# Patient Record
Sex: Female | Born: 1950 | Race: Black or African American | Hispanic: No | Marital: Married | Smoking: Never smoker
Health system: Southern US, Community
[De-identification: ages and names within clinical notes are randomized; demographics above are authoritative.]

## PROBLEM LIST (undated history)

## (undated) DIAGNOSIS — Z860100 Personal history of colon polyps, unspecified: Secondary | ICD-10-CM

## (undated) DIAGNOSIS — G4700 Insomnia, unspecified: Secondary | ICD-10-CM

## (undated) DIAGNOSIS — I10 Essential (primary) hypertension: Secondary | ICD-10-CM

## (undated) DIAGNOSIS — M549 Dorsalgia, unspecified: Secondary | ICD-10-CM

## (undated) DIAGNOSIS — Z87440 Personal history of urinary (tract) infections: Secondary | ICD-10-CM

## (undated) DIAGNOSIS — Z923 Personal history of irradiation: Secondary | ICD-10-CM

## (undated) DIAGNOSIS — C50919 Malignant neoplasm of unspecified site of unspecified female breast: Secondary | ICD-10-CM

## (undated) DIAGNOSIS — M1990 Unspecified osteoarthritis, unspecified site: Secondary | ICD-10-CM

## (undated) DIAGNOSIS — G629 Polyneuropathy, unspecified: Secondary | ICD-10-CM

## (undated) DIAGNOSIS — Z9289 Personal history of other medical treatment: Secondary | ICD-10-CM

## (undated) DIAGNOSIS — R42 Dizziness and giddiness: Secondary | ICD-10-CM

## (undated) DIAGNOSIS — K5790 Diverticulosis of intestine, part unspecified, without perforation or abscess without bleeding: Secondary | ICD-10-CM

## (undated) DIAGNOSIS — G8929 Other chronic pain: Secondary | ICD-10-CM

## (undated) DIAGNOSIS — E7800 Pure hypercholesterolemia, unspecified: Secondary | ICD-10-CM

## (undated) DIAGNOSIS — R51 Headache: Secondary | ICD-10-CM

## (undated) DIAGNOSIS — K5900 Constipation, unspecified: Secondary | ICD-10-CM

## (undated) DIAGNOSIS — M542 Cervicalgia: Secondary | ICD-10-CM

## (undated) DIAGNOSIS — E039 Hypothyroidism, unspecified: Secondary | ICD-10-CM

## (undated) HISTORY — DX: Headache: R51

## (undated) HISTORY — DX: Cervicalgia: M54.2

## (undated) HISTORY — DX: Dizziness and giddiness: R42

## (undated) HISTORY — DX: Personal history of irradiation: Z92.3

## (undated) HISTORY — DX: Malignant neoplasm of unspecified site of unspecified female breast: C50.919

---

## 1981-01-30 HISTORY — PX: ABDOMINAL HYSTERECTOMY: SHX81

## 1992-01-31 HISTORY — PX: CHOLECYSTECTOMY: SHX55

## 2000-11-06 ENCOUNTER — Encounter: Payer: Self-pay | Admitting: Internal Medicine

## 2000-11-06 ENCOUNTER — Ambulatory Visit (HOSPITAL_COMMUNITY): Admission: RE | Admit: 2000-11-06 | Discharge: 2000-11-06 | Payer: Self-pay | Admitting: Internal Medicine

## 2000-11-26 ENCOUNTER — Encounter: Payer: Self-pay | Admitting: Internal Medicine

## 2000-11-26 ENCOUNTER — Ambulatory Visit (HOSPITAL_COMMUNITY): Admission: RE | Admit: 2000-11-26 | Discharge: 2000-11-26 | Payer: Self-pay | Admitting: Internal Medicine

## 2000-12-12 ENCOUNTER — Ambulatory Visit (HOSPITAL_COMMUNITY): Admission: RE | Admit: 2000-12-12 | Discharge: 2000-12-12 | Payer: Self-pay | Admitting: General Surgery

## 2001-02-04 ENCOUNTER — Other Ambulatory Visit: Admission: RE | Admit: 2001-02-04 | Discharge: 2001-02-04 | Payer: Self-pay | Admitting: Internal Medicine

## 2001-06-19 ENCOUNTER — Ambulatory Visit (HOSPITAL_COMMUNITY): Admission: RE | Admit: 2001-06-19 | Discharge: 2001-06-19 | Payer: Self-pay | Admitting: Internal Medicine

## 2001-06-19 ENCOUNTER — Encounter: Payer: Self-pay | Admitting: Internal Medicine

## 2001-11-27 ENCOUNTER — Encounter: Payer: Self-pay | Admitting: Internal Medicine

## 2001-11-27 ENCOUNTER — Ambulatory Visit (HOSPITAL_COMMUNITY): Admission: RE | Admit: 2001-11-27 | Discharge: 2001-11-27 | Payer: Self-pay | Admitting: Internal Medicine

## 2002-06-10 ENCOUNTER — Encounter (INDEPENDENT_AMBULATORY_CARE_PROVIDER_SITE_OTHER): Payer: Self-pay | Admitting: Internal Medicine

## 2002-12-09 ENCOUNTER — Ambulatory Visit (HOSPITAL_COMMUNITY): Admission: RE | Admit: 2002-12-09 | Discharge: 2002-12-09 | Payer: Self-pay | Admitting: Internal Medicine

## 2003-02-10 ENCOUNTER — Ambulatory Visit (HOSPITAL_COMMUNITY): Admission: RE | Admit: 2003-02-10 | Discharge: 2003-02-10 | Payer: Self-pay | Admitting: Internal Medicine

## 2003-02-10 HISTORY — PX: ESOPHAGOGASTRODUODENOSCOPY: SHX1529

## 2003-04-20 ENCOUNTER — Encounter (INDEPENDENT_AMBULATORY_CARE_PROVIDER_SITE_OTHER): Payer: Self-pay | Admitting: Internal Medicine

## 2003-10-22 ENCOUNTER — Encounter: Admission: RE | Admit: 2003-10-22 | Discharge: 2003-10-22 | Payer: Self-pay | Admitting: Gastroenterology

## 2003-12-21 ENCOUNTER — Ambulatory Visit (HOSPITAL_COMMUNITY): Admission: RE | Admit: 2003-12-21 | Discharge: 2003-12-21 | Payer: Self-pay | Admitting: Internal Medicine

## 2004-03-15 ENCOUNTER — Encounter: Admission: RE | Admit: 2004-03-15 | Discharge: 2004-03-15 | Payer: Self-pay | Admitting: Neurology

## 2004-03-29 ENCOUNTER — Encounter (HOSPITAL_COMMUNITY): Admission: RE | Admit: 2004-03-29 | Discharge: 2004-04-28 | Payer: Self-pay | Admitting: Neurology

## 2004-12-17 ENCOUNTER — Encounter: Admission: RE | Admit: 2004-12-17 | Discharge: 2004-12-17 | Payer: Self-pay | Admitting: Internal Medicine

## 2004-12-21 ENCOUNTER — Ambulatory Visit (HOSPITAL_COMMUNITY): Admission: RE | Admit: 2004-12-21 | Discharge: 2004-12-21 | Payer: Self-pay | Admitting: Internal Medicine

## 2005-04-25 ENCOUNTER — Encounter (HOSPITAL_COMMUNITY): Admission: RE | Admit: 2005-04-25 | Discharge: 2005-05-25 | Payer: Self-pay | Admitting: Endocrinology

## 2005-05-15 ENCOUNTER — Encounter (INDEPENDENT_AMBULATORY_CARE_PROVIDER_SITE_OTHER): Payer: Self-pay | Admitting: Internal Medicine

## 2005-05-16 ENCOUNTER — Encounter (INDEPENDENT_AMBULATORY_CARE_PROVIDER_SITE_OTHER): Payer: Self-pay | Admitting: Internal Medicine

## 2005-07-14 ENCOUNTER — Encounter (INDEPENDENT_AMBULATORY_CARE_PROVIDER_SITE_OTHER): Payer: Self-pay | Admitting: Internal Medicine

## 2005-07-16 ENCOUNTER — Encounter (INDEPENDENT_AMBULATORY_CARE_PROVIDER_SITE_OTHER): Payer: Self-pay | Admitting: Internal Medicine

## 2005-07-17 ENCOUNTER — Ambulatory Visit: Payer: Self-pay | Admitting: Internal Medicine

## 2005-09-25 ENCOUNTER — Ambulatory Visit: Payer: Self-pay | Admitting: Internal Medicine

## 2005-11-21 ENCOUNTER — Encounter (INDEPENDENT_AMBULATORY_CARE_PROVIDER_SITE_OTHER): Payer: Self-pay | Admitting: Internal Medicine

## 2006-01-03 ENCOUNTER — Ambulatory Visit: Payer: Self-pay | Admitting: Internal Medicine

## 2006-01-08 ENCOUNTER — Ambulatory Visit (HOSPITAL_COMMUNITY): Admission: RE | Admit: 2006-01-08 | Discharge: 2006-01-08 | Payer: Self-pay | Admitting: Internal Medicine

## 2006-01-08 ENCOUNTER — Encounter (INDEPENDENT_AMBULATORY_CARE_PROVIDER_SITE_OTHER): Payer: Self-pay | Admitting: Internal Medicine

## 2006-04-16 ENCOUNTER — Ambulatory Visit (HOSPITAL_COMMUNITY): Admission: RE | Admit: 2006-04-16 | Discharge: 2006-04-16 | Payer: Self-pay | Admitting: Internal Medicine

## 2006-10-22 ENCOUNTER — Ambulatory Visit: Payer: Self-pay | Admitting: Internal Medicine

## 2006-10-22 DIAGNOSIS — E0590 Thyrotoxicosis, unspecified without thyrotoxic crisis or storm: Secondary | ICD-10-CM | POA: Insufficient documentation

## 2006-10-22 DIAGNOSIS — R61 Generalized hyperhidrosis: Secondary | ICD-10-CM | POA: Insufficient documentation

## 2006-10-22 DIAGNOSIS — N6019 Diffuse cystic mastopathy of unspecified breast: Secondary | ICD-10-CM | POA: Insufficient documentation

## 2006-10-22 DIAGNOSIS — I10 Essential (primary) hypertension: Secondary | ICD-10-CM | POA: Insufficient documentation

## 2006-10-22 DIAGNOSIS — N318 Other neuromuscular dysfunction of bladder: Secondary | ICD-10-CM | POA: Insufficient documentation

## 2006-10-22 DIAGNOSIS — Z9189 Other specified personal risk factors, not elsewhere classified: Secondary | ICD-10-CM | POA: Insufficient documentation

## 2006-10-22 DIAGNOSIS — E785 Hyperlipidemia, unspecified: Secondary | ICD-10-CM | POA: Insufficient documentation

## 2006-10-23 ENCOUNTER — Telehealth (INDEPENDENT_AMBULATORY_CARE_PROVIDER_SITE_OTHER): Payer: Self-pay | Admitting: *Deleted

## 2006-10-23 LAB — CONVERTED CEMR LAB
Basophils Absolute: 0 10*3/uL (ref 0.0–0.1)
Basophils Relative: 0 % (ref 0–1)
Eosinophils Absolute: 0.2 10*3/uL (ref 0.0–0.7)
Eosinophils Relative: 3 % (ref 0–5)
Free T4: 0.95 ng/dL (ref 0.89–1.80)
HCT: 38.3 % (ref 36.0–46.0)
Hemoglobin: 12.2 g/dL (ref 12.0–15.0)
Lymphocytes Relative: 44 % (ref 12–46)
Lymphs Abs: 2.9 10*3/uL (ref 0.7–3.3)
MCHC: 31.9 g/dL (ref 30.0–36.0)
MCV: 88.9 fL (ref 78.0–100.0)
Monocytes Absolute: 0.3 10*3/uL (ref 0.2–0.7)
Monocytes Relative: 4 % (ref 3–11)
Neutro Abs: 3.2 10*3/uL (ref 1.7–7.7)
Neutrophils Relative %: 49 % (ref 43–77)
Platelets: 276 10*3/uL (ref 150–400)
RBC: 4.31 M/uL (ref 3.87–5.11)
RDW: 13 % (ref 11.5–14.0)
TSH: 1.43 microintl units/mL (ref 0.350–5.50)
WBC: 6.6 10*3/uL (ref 4.0–10.5)

## 2006-12-29 ENCOUNTER — Emergency Department (HOSPITAL_COMMUNITY): Admission: EM | Admit: 2006-12-29 | Discharge: 2006-12-29 | Payer: Self-pay | Admitting: Emergency Medicine

## 2007-01-08 ENCOUNTER — Encounter (HOSPITAL_COMMUNITY): Admission: RE | Admit: 2007-01-08 | Discharge: 2007-01-30 | Payer: Self-pay | Admitting: Internal Medicine

## 2007-01-10 ENCOUNTER — Ambulatory Visit (HOSPITAL_COMMUNITY): Admission: RE | Admit: 2007-01-10 | Discharge: 2007-01-10 | Payer: Self-pay | Admitting: Internal Medicine

## 2007-01-15 ENCOUNTER — Telehealth (INDEPENDENT_AMBULATORY_CARE_PROVIDER_SITE_OTHER): Payer: Self-pay | Admitting: *Deleted

## 2007-02-11 ENCOUNTER — Ambulatory Visit (HOSPITAL_COMMUNITY): Admission: RE | Admit: 2007-02-11 | Discharge: 2007-02-11 | Payer: Self-pay | Admitting: Internal Medicine

## 2007-03-25 ENCOUNTER — Ambulatory Visit: Payer: Self-pay | Admitting: Internal Medicine

## 2007-03-25 DIAGNOSIS — N951 Menopausal and female climacteric states: Secondary | ICD-10-CM

## 2007-03-27 ENCOUNTER — Telehealth (INDEPENDENT_AMBULATORY_CARE_PROVIDER_SITE_OTHER): Payer: Self-pay | Admitting: Internal Medicine

## 2007-04-02 ENCOUNTER — Telehealth (INDEPENDENT_AMBULATORY_CARE_PROVIDER_SITE_OTHER): Payer: Self-pay | Admitting: *Deleted

## 2007-06-28 ENCOUNTER — Ambulatory Visit (HOSPITAL_COMMUNITY): Admission: RE | Admit: 2007-06-28 | Discharge: 2007-06-28 | Payer: Self-pay | Admitting: Internal Medicine

## 2007-12-13 ENCOUNTER — Ambulatory Visit (HOSPITAL_COMMUNITY): Admission: RE | Admit: 2007-12-13 | Discharge: 2007-12-13 | Payer: Self-pay | Admitting: Internal Medicine

## 2007-12-30 ENCOUNTER — Ambulatory Visit (HOSPITAL_COMMUNITY): Admission: RE | Admit: 2007-12-30 | Discharge: 2007-12-30 | Payer: Self-pay | Admitting: Internal Medicine

## 2008-01-15 ENCOUNTER — Ambulatory Visit (HOSPITAL_COMMUNITY): Admission: RE | Admit: 2008-01-15 | Discharge: 2008-01-15 | Payer: Self-pay | Admitting: Obstetrics and Gynecology

## 2008-01-24 ENCOUNTER — Emergency Department (HOSPITAL_COMMUNITY): Admission: EM | Admit: 2008-01-24 | Discharge: 2008-01-24 | Payer: Self-pay | Admitting: Emergency Medicine

## 2008-04-17 ENCOUNTER — Ambulatory Visit (HOSPITAL_COMMUNITY)
Admission: RE | Admit: 2008-04-17 | Discharge: 2008-04-17 | Payer: Self-pay | Admitting: Physical Medicine and Rehabilitation

## 2008-07-22 ENCOUNTER — Ambulatory Visit (HOSPITAL_COMMUNITY): Admission: RE | Admit: 2008-07-22 | Discharge: 2008-07-22 | Payer: Self-pay | Admitting: Endocrinology

## 2008-07-23 ENCOUNTER — Emergency Department (HOSPITAL_COMMUNITY): Admission: EM | Admit: 2008-07-23 | Discharge: 2008-07-23 | Payer: Self-pay | Admitting: Emergency Medicine

## 2008-08-04 ENCOUNTER — Encounter (INDEPENDENT_AMBULATORY_CARE_PROVIDER_SITE_OTHER): Payer: Self-pay | Admitting: *Deleted

## 2008-09-08 ENCOUNTER — Ambulatory Visit: Payer: Self-pay | Admitting: Internal Medicine

## 2008-09-09 ENCOUNTER — Encounter: Payer: Self-pay | Admitting: Internal Medicine

## 2008-09-14 DIAGNOSIS — R1310 Dysphagia, unspecified: Secondary | ICD-10-CM | POA: Insufficient documentation

## 2008-09-17 ENCOUNTER — Ambulatory Visit (HOSPITAL_COMMUNITY): Admission: RE | Admit: 2008-09-17 | Discharge: 2008-09-17 | Payer: Self-pay | Admitting: Internal Medicine

## 2008-09-17 ENCOUNTER — Ambulatory Visit: Payer: Self-pay | Admitting: Internal Medicine

## 2008-09-17 HISTORY — PX: COLONOSCOPY: SHX174

## 2008-09-17 HISTORY — PX: ESOPHAGOGASTRODUODENOSCOPY: SHX1529

## 2008-10-02 ENCOUNTER — Ambulatory Visit (HOSPITAL_COMMUNITY): Admission: RE | Admit: 2008-10-02 | Discharge: 2008-10-02 | Payer: Self-pay | Admitting: Neurology

## 2008-10-30 ENCOUNTER — Ambulatory Visit: Admission: RE | Admit: 2008-10-30 | Discharge: 2008-10-30 | Payer: Self-pay | Admitting: Neurology

## 2008-11-18 ENCOUNTER — Encounter (INDEPENDENT_AMBULATORY_CARE_PROVIDER_SITE_OTHER): Payer: Self-pay | Admitting: Internal Medicine

## 2009-01-04 ENCOUNTER — Encounter: Admission: RE | Admit: 2009-01-04 | Discharge: 2009-01-04 | Payer: Self-pay | Admitting: Otolaryngology

## 2009-01-18 ENCOUNTER — Ambulatory Visit (HOSPITAL_COMMUNITY): Admission: RE | Admit: 2009-01-18 | Discharge: 2009-01-18 | Payer: Self-pay | Admitting: Obstetrics and Gynecology

## 2009-01-30 HISTORY — PX: THYROID SURGERY: SHX805

## 2009-03-22 ENCOUNTER — Ambulatory Visit: Payer: Self-pay | Admitting: Otolaryngology

## 2009-04-01 ENCOUNTER — Ambulatory Visit: Payer: Self-pay | Admitting: Otolaryngology

## 2009-08-30 HISTORY — PX: EUS: SHX5427

## 2009-09-06 ENCOUNTER — Emergency Department (HOSPITAL_COMMUNITY)
Admission: EM | Admit: 2009-09-06 | Discharge: 2009-09-06 | Payer: Self-pay | Source: Home / Self Care | Admitting: Emergency Medicine

## 2009-09-22 ENCOUNTER — Encounter: Payer: Self-pay | Admitting: Internal Medicine

## 2010-01-20 ENCOUNTER — Ambulatory Visit (HOSPITAL_COMMUNITY)
Admission: RE | Admit: 2010-01-20 | Discharge: 2010-01-20 | Payer: Self-pay | Source: Home / Self Care | Attending: Obstetrics and Gynecology | Admitting: Obstetrics and Gynecology

## 2010-03-03 NOTE — Letter (Signed)
Summary: OFFICE NOTE FROM [facility]  OFFICE NOTE FROM [facility]   Imported By: Rexene Alberts 09/22/2009 16:10:39  _____________________________________________________________________  External Attachment:    Type:   Image     Comment:   External Document

## 2010-04-05 ENCOUNTER — Other Ambulatory Visit (HOSPITAL_COMMUNITY): Payer: Self-pay | Admitting: Internal Medicine

## 2010-04-05 DIAGNOSIS — M5410 Radiculopathy, site unspecified: Secondary | ICD-10-CM

## 2010-04-07 ENCOUNTER — Ambulatory Visit (HOSPITAL_COMMUNITY)
Admission: RE | Admit: 2010-04-07 | Discharge: 2010-04-07 | Disposition: A | Discharge status: Home / Self Care | Payer: BC Managed Care – PPO | Source: Ambulatory Visit | Attending: Internal Medicine | Admitting: Internal Medicine

## 2010-04-07 DIAGNOSIS — M546 Pain in thoracic spine: Secondary | ICD-10-CM | POA: Insufficient documentation

## 2010-04-07 DIAGNOSIS — M4804 Spinal stenosis, thoracic region: Secondary | ICD-10-CM | POA: Insufficient documentation

## 2010-04-07 DIAGNOSIS — M5410 Radiculopathy, site unspecified: Secondary | ICD-10-CM

## 2010-04-15 LAB — URINE MICROSCOPIC-ADD ON

## 2010-04-15 LAB — URINALYSIS, ROUTINE W REFLEX MICROSCOPIC
Bilirubin Urine: NEGATIVE
Glucose, UA: NEGATIVE mg/dL
Ketones, ur: NEGATIVE mg/dL
Leukocytes, UA: NEGATIVE
Nitrite: NEGATIVE
Protein, ur: NEGATIVE mg/dL
Specific Gravity, Urine: 1.01 (ref 1.005–1.030)
Urobilinogen, UA: 0.2 mg/dL (ref 0.0–1.0)
pH: 7.5 (ref 5.0–8.0)

## 2010-05-02 ENCOUNTER — Other Ambulatory Visit (HOSPITAL_COMMUNITY): Payer: Self-pay | Admitting: Internal Medicine

## 2010-05-02 ENCOUNTER — Ambulatory Visit (HOSPITAL_COMMUNITY)
Admission: RE | Admit: 2010-05-02 | Discharge: 2010-05-02 | Disposition: A | Discharge status: Home / Self Care | Payer: BC Managed Care – PPO | Source: Ambulatory Visit | Attending: Internal Medicine | Admitting: Internal Medicine

## 2010-05-02 DIAGNOSIS — R0781 Pleurodynia: Secondary | ICD-10-CM

## 2010-05-02 DIAGNOSIS — R079 Chest pain, unspecified: Secondary | ICD-10-CM | POA: Insufficient documentation

## 2010-05-09 LAB — BASIC METABOLIC PANEL
BUN: 9 mg/dL (ref 6–23)
CO2: 34 mEq/L — ABNORMAL HIGH (ref 19–32)
Chloride: 102 mEq/L (ref 96–112)
GFR calc non Af Amer: >60 mL/min (ref >60)
Glucose, Bld: 109 mg/dL — ABNORMAL HIGH (ref 70–99)
Potassium: 3.4 mEq/L — ABNORMAL LOW (ref 3.5–5.1)
Sodium: 139 mEq/L (ref 135–145)

## 2010-05-09 LAB — URINALYSIS, ROUTINE W REFLEX MICROSCOPIC
Bilirubin Urine: NEGATIVE
Ketones, ur: NEGATIVE mg/dL
Leukocytes, UA: NEGATIVE
Nitrite: NEGATIVE
Protein, ur: NEGATIVE mg/dL
Urobilinogen, UA: 0.2 mg/dL (ref 0.0–1.0)
pH: 6.5 (ref 5.0–8.0)

## 2010-05-09 LAB — DIFFERENTIAL
Basophils Absolute: 0 10*3/uL (ref 0.0–0.1)
Eosinophils Absolute: 0.1 10*3/uL (ref 0.0–0.7)
Eosinophils Relative: 1 % (ref 0–5)
Lymphocytes Relative: 27 % (ref 12–46)
Monocytes Absolute: 0.3 10*3/uL (ref 0.1–1.0)

## 2010-05-09 LAB — CBC
HCT: 37.7 % (ref 36.0–46.0)
Hemoglobin: 12.9 g/dL (ref 12.0–15.0)
MCV: 86 fL (ref 78.0–100.0)
Platelets: 258 10*3/uL (ref 150–400)
RDW: 13.5 % (ref 11.5–15.5)

## 2010-06-01 ENCOUNTER — Emergency Department (HOSPITAL_COMMUNITY): Payer: No Typology Code available for payment source

## 2010-06-01 ENCOUNTER — Emergency Department (HOSPITAL_COMMUNITY)
Admission: EM | Admit: 2010-06-01 | Discharge: 2010-06-01 | Disposition: A | Discharge status: Home / Self Care | Payer: No Typology Code available for payment source | Attending: Emergency Medicine | Admitting: Emergency Medicine

## 2010-06-01 DIAGNOSIS — Z79899 Other long term (current) drug therapy: Secondary | ICD-10-CM | POA: Insufficient documentation

## 2010-06-01 DIAGNOSIS — Z043 Encounter for examination and observation following other accident: Secondary | ICD-10-CM | POA: Insufficient documentation

## 2010-06-01 DIAGNOSIS — I10 Essential (primary) hypertension: Secondary | ICD-10-CM | POA: Insufficient documentation

## 2010-06-01 DIAGNOSIS — Y92410 Unspecified street and highway as the place of occurrence of the external cause: Secondary | ICD-10-CM | POA: Insufficient documentation

## 2010-06-01 DIAGNOSIS — E7800 Pure hypercholesterolemia, unspecified: Secondary | ICD-10-CM | POA: Insufficient documentation

## 2010-06-01 DIAGNOSIS — Z7982 Long term (current) use of aspirin: Secondary | ICD-10-CM | POA: Insufficient documentation

## 2010-06-01 DIAGNOSIS — M79609 Pain in unspecified limb: Secondary | ICD-10-CM | POA: Insufficient documentation

## 2010-06-08 ENCOUNTER — Ambulatory Visit (HOSPITAL_COMMUNITY)
Admission: RE | Admit: 2010-06-08 | Discharge: 2010-06-08 | Disposition: A | Discharge status: Home / Self Care | Payer: BC Managed Care – PPO | Source: Ambulatory Visit | Attending: Internal Medicine | Admitting: Internal Medicine

## 2010-06-08 ENCOUNTER — Other Ambulatory Visit (HOSPITAL_COMMUNITY): Payer: Self-pay | Admitting: Internal Medicine

## 2010-06-08 DIAGNOSIS — M542 Cervicalgia: Secondary | ICD-10-CM | POA: Insufficient documentation

## 2010-06-08 DIAGNOSIS — M5030 Other cervical disc degeneration, unspecified cervical region: Secondary | ICD-10-CM | POA: Insufficient documentation

## 2010-06-14 NOTE — Op Note (Signed)
[name]                ACCOUNT NO.:  [id number]   MEDICAL RECORD NO.:  [id number]          PATIENT TYPE:  AMB   LOCATION:  DAY                           FACILITY:  [facility]   PHYSICIAN:  R. Roetta Sessions, M.D. DATE OF BIRTH:  10-Oct-1950   DATE OF PROCEDURE:  09/17/2008  DATE OF DISCHARGE:                               OPERATIVE REPORT   INDICATIONS FOR PROCEDURE:  A 60 year old lady comes for colorectal  cancer screening.  Last colonoscopy was many years ago.  She has no  lower GI tract symptoms.  No family history colon cancer.  She came to  see me recently for a 2 month history of a foreign body sensation in the  base of her throat on the right side.  I saw her in the office on September 08, 2008.  She already had a barium pill esophagram which demonstrated  no obstructing lesion and she had some esophageal diverticula.  Back in  2005 Dr. Karilyn Cota saw her for dysphagia and found some benign appearing  cystic lesions in the distal esophagus.  She really does not have any  true odynophagia or dysphagia.  In fact she tells me since September 08, 2008 her throat symptoms have nearly resolved.  However, the plan was to  bring her in for an EGD today and at the same time a colonoscopy and  directly survey the mucosal surfaces of her upper GI tract.  This  approach has been reviewed with the patient previously and again at the  bedside.  Risks, benefits, alternatives and limitations have been  discussed and questions answered.  She is agreeable.  Please see the  documentation in the medical record.   PROCEDURE NOTE:  O2 saturation, blood pressure, pulse, respirations  monitored throughout the entirety of both procedures.  Conscious  sedation:  Versed 6 mg IV, Demerol 100 mg IV in divided doses.  Instrument:  Pentax video chip system.  Cetacaine spray for topical  pharyngeal anesthesia.   FINDINGS:  EGD:  Examination of tubular esophagus revealed no  hypopharyngeal abnormalities.   Esophagus was easily intubated.  Examination of the tubular esophagus revealed two midbody shallow  diverticula.  There were also four benign-appearing cysts just above the  GE junction.  These were certainly nonobstructing.  They appeared to be  benign.  The largest was no more than 5 mm in dimensions.  EG junction  easily traversed.  Duodenum and stomach:  Gastric cavity was empty and  insufflated well with air.  Thorough examination of the gastric mucosa  including retroflexed of the proximal stomach and esophagogastric  junction demonstrated only a small hiatal hernia.  Pylorus was patent,  easily traversed.  Examination of the bulb and second portion revealed  no abnormalities.  Therapeutic/diagnostic maneuvers performed:  None.   The patient tolerated the procedure well and was prepared for  colonoscopy.  Digital rectal exam revealed no abnormalities.  Endoscopic  findings:  The prep was adequate.  Colon:  Colonic mucosa was surveyed  from the rectosigmoid junction through the left, transverse and right  colon to  the appendiceal orifice, ileocecal valve and cecum.  These  structures were well seen and photographed for the record.  From this  level the scope was slowly and cautiously withdrawn.  All previous  mentioned mucosal surfaces were again seen.  The patient had a Long  tortuous colon requiring a number of maneuvers including changing of the  patient's position, external abdominal pressure to reach the cecum.  Patient had scattered pan colonic diverticula.  However, the remainder  of the colonic mucosa appeared normal.  Scope was pulled in the rectum  where thorough examination of the rectal mucosa including retroflex view  of the anal verge demonstrated no abnormalities.  Cecal withdrawal time  7 minutes.  The patient tolerated both procedures well, was a reactive  endoscopy.   IMPRESSION ESOPHAGOGASTRODUODENOSCOPY:  1. Two mid esophageal diverticula.  2. Small benign  cystic mucosal lesions distal esophagus of doubtful      clinical significance, stable for least 5 years.  3. Otherwise normal-appearing esophagus.  4. Small hiatal hernia, otherwise normal stomach D1, D2.   COLONOSCOPY FINDINGS:  1. Normal rectum.  2. Scattered diverticula.  3. Long tortuous, but otherwise normal-appearing colon.   RECOMMENDATIONS:  1. The patient's ENT symptoms are settling down.  However, if they      persist I have recommend ENT referral at this time.  I recommend no      further GI evaluation as far as those symptoms are concerned.  2. Diverticulosis literature provided to Ms. Polidore.  3. Consider repeat screening colonoscopy 10 years.      Jonathon Bellows, M.D.  Electronically Signed     RMR/MEDQ  D:  09/17/2008  T:  09/17/2008  Job:  [id number]   cc:   Kingsley Callander. Ouida Sills, MD  Fax: [phone]

## 2010-06-17 NOTE — Op Note (Signed)
NAME:  [name], [name]                          ACCOUNT NO.:  [id number]   MEDICAL RECORD NO.:  [id number]                   PATIENT TYPE:  AMB   LOCATION:  DAY                                  FACILITY:  [facility]   PHYSICIAN:  Lionel December, M.D.                 DATE OF BIRTH:  19-Feb-1950   DATE OF PROCEDURE:  02/10/2003  DATE OF DISCHARGE:                                 OPERATIVE REPORT   PROCEDURE:  Esophagogastroduodenoscopy with esophageal dilatation.   ENDOSCOPIST:  Lionel December, M.D.   INDICATIONS:  [name] is a 60 year old African-American female who presents  with a 2-week history of dysphagia. She points to her upper sternal area as  the site of bolus obstruction.  It eventually goes down, and sometimes she  regurgitates this.  She has had problems with heartburn, but this has been  less than 3 times a week.   The procedure and risks were reviewed with the patient and informed consent  was obtained.   PREOPERATIVE MEDICATIONS:  Cetacaine spray for oropharyngeal topical  anesthesia, Demerol 50 mg IV and Versed 7 mg IV in divided dose.   FINDINGS:  Procedure performed in endoscopy suite.  The patient's vital  signs and O2 saturation were monitored during the procedure and remained  stable.  The patient was placed in the left lateral recumbent position and  Olympus videoscope was passed via the oropharynx without any difficulty into  the esophagus.   ESOPHAGUS:  Mucosa of the proximal and middle third were normal. Distally  there were 3 rounded lesions just proximal to the GE junction.  The larger  of the 2 measured about 10 mm each. The one towards 7 o'clock had erythema,  focal area, suggestive of epithelial denuding.  The third lesions was much  smaller.  The gastroesophageal junction was wavy.  No ring or stricture was  noted.   STOMACH:  It was empty and distended very well with insufflation.  The folds  of the proximal stomach were normal.  Examination of mucosa  revealed antral  erythema.  Pyloric channel was patent.  Angularis, fundus, and cardia were  examined by retroflexing the scope and were normal.   DUODENUM:  Examination of the bulb and postbulbar duodenum were normal.  Endoscope was withdrawn.  Esophagus was dilated by passing 56 [place] Maloney  dilator to full insertion.  Endoscope was passed, again, and esophagus  reexamined and there was no mucosal disruption.   The endoscope was withdrawn.  The patient tolerated the procedure well.   FINAL DIAGNOSES:  1. Distal esophageal 3 cystic lesions without luminal compromise or evidence     of esophagitis.  No evidence of distal esophageal ring or stricture.     Etiology of these cystic lesions is unclear.  This could be an allergic     reaction to a medication or is a benign process.  This possibly has  nothing to do with the dysphagia unless these are much larger in size and     they have gotten smaller.  2. Nonerosive antral gastritis.  3. The esophagus was dilated by passing 56 [place] Maloney dilator.   RECOMMENDATIONS:  1. H. pylori serology will be checked.  2. Will ask her to take omeprazole 20 mg p.o. q.a.m.  3. If dysphagia does not improve will bring her back for barium swallow with     a pill; and consider empiric trial with Medrol Dosepak.  4. She will need a follow up EGD within the next couple of months to make     sure that this process is not progressive.      ___________________________________________                                            Lionel December, M.D.   NR/MEDQ  D:  02/10/2003  T:  02/10/2003  Job:  [id number]   cc:   Kingsley Callander. Ouida Sills, M.D.  [address]  [place]  [place] [address]  Fax: [phone]

## 2010-06-17 NOTE — H&P (Signed)
[facility]  Patient:    [name], [name] Visit Number: [id number] MRN: [id number]          Service Type: END Location: DAY Attending Physician:  Dessa Phi Dictated by:   Elpidio Anis, M.D. Admit Date:  12/12/2000 Discharge Date: 12/12/2000                           History and Physical  HISTORY OF PRESENT ILLNESS:  A 60 year old female referred for screening colonoscopy.  No history of intestinal problems.  No rectal bleeding or abdominal pain.  Negative family history of colon cancer.  PAST MEDICAL HISTORY:  Totally unremarkable.  There is no history of heart, lung, or kidney disease.  MEDICATIONS: 1. Premarin 0.625 mg q.d. 2. Mevacor 40 mg q.d. 3. Tylox p.r.n. 4. Aspirin one q.d.  PAST SURGICAL HISTORY:  Total abdominal hysterectomy and cholecystectomy.  REVIEW OF SYSTEMS:  Positive for herniated nucleus pulposus, lumbar region, with a right radiculopathy.  ALLERGIES:  CODEINE.  PHYSICAL EXAMINATION:  VITAL SIGNS:  Blood pressure 124/84, pulse 74, respirations 20, weight 163 pounds.  HEENT:  Unremarkable.  NECK:  Supple without JVD or bruit.  CHEST:  Clear to auscultation.  HEART:  Regular rate and rhythm without murmur, gallop, or rub.  ABDOMEN:  Soft, nontender.  No masses.  EXTREMITIES:  No clubbing, cyanosis, or edema.  NEUROLOGIC:  Exam nonfocal.  IMPRESSION:  Need for screening colonoscopy.  PLAN:  Total colonoscopy. Dictated by:   Elpidio Anis, M.D. Attending Physician:  Dessa Phi DD:  12/11/00 TD:  12/11/00 Job: [id number]

## 2010-11-08 LAB — URINE MICROSCOPIC-ADD ON

## 2010-11-08 LAB — URINALYSIS, ROUTINE W REFLEX MICROSCOPIC
Bilirubin Urine: NEGATIVE
Glucose, UA: NEGATIVE
Ketones, ur: NEGATIVE
Nitrite: NEGATIVE
Protein, ur: NEGATIVE
pH: 5.5

## 2010-11-08 LAB — BASIC METABOLIC PANEL
Calcium: 9.3
GFR calc Af Amer: >60
GFR calc non Af Amer: 57 — ABNORMAL LOW
Potassium: 3.7
Sodium: 137

## 2010-12-04 ENCOUNTER — Emergency Department (HOSPITAL_COMMUNITY)
Admission: EM | Admit: 2010-12-04 | Discharge: 2010-12-04 | Disposition: A | Discharge status: Home / Self Care | Payer: BC Managed Care – PPO | Attending: Emergency Medicine | Admitting: Emergency Medicine

## 2010-12-04 DIAGNOSIS — I10 Essential (primary) hypertension: Secondary | ICD-10-CM | POA: Insufficient documentation

## 2010-12-04 DIAGNOSIS — E7800 Pure hypercholesterolemia, unspecified: Secondary | ICD-10-CM | POA: Insufficient documentation

## 2010-12-04 DIAGNOSIS — H6690 Otitis media, unspecified, unspecified ear: Secondary | ICD-10-CM | POA: Insufficient documentation

## 2010-12-04 DIAGNOSIS — E039 Hypothyroidism, unspecified: Secondary | ICD-10-CM | POA: Insufficient documentation

## 2010-12-04 DIAGNOSIS — H6691 Otitis media, unspecified, right ear: Secondary | ICD-10-CM

## 2010-12-04 HISTORY — DX: Essential (primary) hypertension: I10

## 2010-12-04 HISTORY — DX: Hypothyroidism, unspecified: E03.9

## 2010-12-04 HISTORY — DX: Pure hypercholesterolemia, unspecified: E78.00

## 2010-12-04 MED ORDER — AMOXICILLIN 500 MG PO CAPS: 500.0000 mg | ORAL_CAPSULE | Freq: Three times a day (TID) | ORAL | Status: AC

## 2010-12-04 MED ORDER — IBUPROFEN 800 MG PO TABS
800.0000 mg | ORAL_TABLET | Freq: Once | ORAL | Status: AC
  Administered 2010-12-04: 800 mg via ORAL
  Filled 2010-12-04: qty 1

## 2010-12-04 MED ORDER — AMOXICILLIN 250 MG PO CAPS
500.0000 mg | ORAL_CAPSULE | Freq: Once | ORAL | Status: AC
  Administered 2010-12-04: 500 mg via ORAL
  Filled 2010-12-04: qty 1

## 2010-12-04 MED ORDER — ANTIPYRINE-BENZOCAINE 5.4-1.4 % OT SOLN
3.0000 [drp] | Freq: Once | OTIC | Status: AC
  Administered 2010-12-04: 3 [drp] via OTIC
  Filled 2010-12-04: qty 10

## 2010-12-04 MED ORDER — IBUPROFEN 800 MG PO TABS: 800.0000 mg | ORAL_TABLET | Freq: Three times a day (TID) | ORAL | Status: AC | PRN

## 2010-12-04 NOTE — ED Provider Notes (Signed)
History     CSN: [id number] Arrival date & time: 12/04/2010  6:04 AM   First MD Initiated Contact with Patient 12/04/10 [phone]      Chief Complaint  Patient presents with  . Otalgia    right    (Consider location/radiation/quality/duration/timing/severity/associated sxs/prior treatment) HPI Comments: The patient presents with one week of right ear pain, right preauricular lymphadenopathy, and right-sided throat irritation without fever, chills, nasal congestion, postnasal drip, or cough. She denies headache.  Patient is a 61 y.o. female presenting with ear pain. The history is provided by the patient.  Otalgia This is a new problem. The current episode started more than 2 days ago. There is pain in the right ear. The problem occurs constantly. The problem has been gradually worsening. There has been no fever. The pain is at a severity of 7/10. Associated symptoms include sore throat. Pertinent negatives include no ear discharge, no headaches, no hearing loss, no rhinorrhea, no abdominal pain, no diarrhea, no vomiting, no neck pain, no cough and no rash.    Past Medical History  Diagnosis Date  . Hypercholesteremia   . Hypothyroid   . Hypertension     Past Surgical History  Procedure Date  . Thyroid surgery     No family history on file.  History  Substance Use Topics  . Smoking status: Never Smoker   . Smokeless tobacco: Not on file  . Alcohol Use: No    OB History    Grav Para Term Preterm Abortions TAB SAB Ect Mult Living                  Review of Systems  Constitutional: Negative for fever and chills.  HENT: Positive for ear pain and sore throat. Negative for hearing loss, congestion, rhinorrhea, mouth sores, neck pain, postnasal drip and ear discharge.   Eyes: Negative for photophobia, pain, discharge, redness and visual disturbance.  Respiratory: Negative for cough and shortness of breath.   Cardiovascular: Negative.   Gastrointestinal: Negative for nausea,  vomiting, abdominal pain and diarrhea.  Musculoskeletal: Negative.   Skin: Negative for rash.  Neurological: Negative for light-headedness and headaches.  Hematological: Positive for adenopathy.  Psychiatric/Behavioral: Negative.     Allergies  Codeine and Percocet  Home Medications   Current Outpatient Rx  Name Route Sig Dispense Refill  . ASPIRIN 81 MG PO TABS Oral Take 81 mg by mouth daily.      Marland Kitchen HYDROCHLOROTHIAZIDE 25 MG PO TABS Oral Take 25 mg by mouth daily.      . IBUPROFEN 800 MG PO TABS Oral Take 800 mg by mouth every 8 (eight) hours as needed.      Marland Kitchen LEVOTHYROXINE SODIUM 88 MCG PO TABS Oral Take 88 mcg by mouth daily.      Marland Kitchen ROSUVASTATIN CALCIUM 5 MG PO TABS Oral Take 5 mg by mouth daily.      Marland Kitchen ZOLPIDEM TARTRATE 10 MG PO TABS Oral Take 10 mg by mouth at bedtime as needed.        BP 146/77  Pulse 72  Temp(Src) 98.8 F (37.1 C) (Oral)  Resp 18  Ht 5\' 4"  (1.626 m)  Wt 168 lb (76.204 kg)  BMI 28.84 kg/m2  SpO2 100%  Physical Exam  Nursing note and vitals reviewed. Constitutional: She is oriented to person, place, and time. She appears well-developed and well-nourished. No distress.  HENT:  Head: Normocephalic and atraumatic. No trismus in the jaw.  Right Ear: Hearing, external ear and ear  canal normal. No mastoid tenderness. Tympanic membrane is erythematous and bulging. Tympanic membrane is not perforated.  Left Ear: Hearing, tympanic membrane, external ear and ear canal normal. No mastoid tenderness. Tympanic membrane is not perforated, not erythematous and not bulging.  Nose: Nose normal.  Mouth/Throat: Mucous membranes are normal. No oral lesions. No uvula swelling. Posterior oropharyngeal erythema present. No oropharyngeal exudate, posterior oropharyngeal edema or tonsillar abscesses.       Right preauricular lymphadenopathy  Eyes: Conjunctivae and EOM are normal. Pupils are equal, round, and reactive to light. Right eye exhibits no discharge. Left eye exhibits  no discharge.  Neck: Normal range of motion. Neck supple. No rigidity.  Cardiovascular: Normal rate and regular rhythm.  Exam reveals no gallop and no friction rub.   No murmur heard. Pulmonary/Chest: Effort normal and breath sounds normal. No respiratory distress. She has no wheezes. She has no rales.  Abdominal: Soft. Bowel sounds are normal.  Musculoskeletal: Normal range of motion. She exhibits no edema and no tenderness.  Lymphadenopathy:    She has cervical adenopathy.       Right cervical: Superficial cervical adenopathy present.       Left cervical: Superficial cervical adenopathy present.  Neurological: She is alert and oriented to person, place, and time. No cranial nerve deficit.  Skin: Skin is warm and dry. No rash noted. No erythema.  Psychiatric: She has a normal mood and affect.    ED Course  Procedures (including critical care time)  Labs Reviewed - No data to display No results found.   No diagnosis found.    MDM  The patient appears to have a right otitis media, however her throat is not impressive for pharyngitis. I believe that the right-sided sore throat is likely irritation from the lymphatic chain draining the right ear. I a will treat the patient in the ED with Auralgan for acute relief of her earache, and I will start her on amoxicillin antibiotic with ibuprofen for pain relief.        Felisa Bonier, MD 12/04/10 [phone]

## 2010-12-04 NOTE — ED Notes (Signed)
Right earache for 1 week

## 2010-12-13 ENCOUNTER — Other Ambulatory Visit (HOSPITAL_COMMUNITY): Payer: Self-pay | Admitting: Internal Medicine

## 2010-12-13 DIAGNOSIS — Z139 Encounter for screening, unspecified: Secondary | ICD-10-CM

## 2011-01-26 ENCOUNTER — Ambulatory Visit (HOSPITAL_COMMUNITY): Payer: BC Managed Care – PPO

## 2011-01-26 ENCOUNTER — Ambulatory Visit (HOSPITAL_COMMUNITY)
Admission: RE | Admit: 2011-01-26 | Discharge: 2011-01-26 | Disposition: A | Discharge status: Home / Self Care | Payer: BC Managed Care – PPO | Source: Ambulatory Visit | Attending: Internal Medicine | Admitting: Internal Medicine

## 2011-01-26 DIAGNOSIS — Z139 Encounter for screening, unspecified: Secondary | ICD-10-CM

## 2011-01-26 DIAGNOSIS — Z1231 Encounter for screening mammogram for malignant neoplasm of breast: Secondary | ICD-10-CM | POA: Insufficient documentation

## 2011-02-03 ENCOUNTER — Other Ambulatory Visit: Payer: Self-pay | Admitting: Internal Medicine

## 2011-02-03 DIAGNOSIS — R928 Other abnormal and inconclusive findings on diagnostic imaging of breast: Secondary | ICD-10-CM

## 2011-02-08 ENCOUNTER — Ambulatory Visit (HOSPITAL_COMMUNITY)
Admission: RE | Admit: 2011-02-08 | Discharge: 2011-02-08 | Disposition: A | Discharge status: Home / Self Care | Payer: BC Managed Care – PPO | Source: Ambulatory Visit | Attending: Internal Medicine | Admitting: Internal Medicine

## 2011-02-08 ENCOUNTER — Other Ambulatory Visit: Payer: Self-pay | Admitting: Internal Medicine

## 2011-02-08 DIAGNOSIS — R928 Other abnormal and inconclusive findings on diagnostic imaging of breast: Secondary | ICD-10-CM

## 2011-02-08 DIAGNOSIS — N6009 Solitary cyst of unspecified breast: Secondary | ICD-10-CM | POA: Insufficient documentation

## 2011-02-15 ENCOUNTER — Encounter (HOSPITAL_COMMUNITY): Payer: BC Managed Care – PPO

## 2011-06-13 ENCOUNTER — Encounter: Payer: Self-pay | Admitting: Obstetrics and Gynecology

## 2011-06-13 ENCOUNTER — Ambulatory Visit (INDEPENDENT_AMBULATORY_CARE_PROVIDER_SITE_OTHER): Payer: BC Managed Care – PPO | Admitting: Obstetrics and Gynecology

## 2011-06-13 VITALS — BP 118/72 | Resp 16 | Ht 64.0 in | Wt 171.0 lb

## 2011-06-13 DIAGNOSIS — L0291 Cutaneous abscess, unspecified: Secondary | ICD-10-CM

## 2011-06-13 DIAGNOSIS — L0390 Cellulitis, unspecified: Secondary | ICD-10-CM

## 2011-06-13 MED ORDER — CEPHALEXIN 500 MG PO CAPS: 500.0000 mg | ORAL_CAPSULE | Freq: Two times a day (BID) | ORAL | Status: AC

## 2011-06-13 NOTE — Progress Notes (Signed)
[name] is a 61 y.o. year old female,No obstetric history on file., who presents for a problem visit.  Subjective:  The patient reports a skin abscess on her mid upper chest.  This area became very tender and and drained spontaneously.  She has applied antibiotic ointment.  The area is healing but is still present.  Objective:  BP 118/72  Resp 16  Ht 5\' 4"  (1.626 m)  Wt 171 lb (77.565 kg)  BMI 29.35 kg/m2   chest: 1 cm minimally tender inclusion cysts on the upper chest.  No signs of cellulitis.  The lesion is not pointing.  Breast exam: No masses, bleeding, or discharge  Exam deferred.  Assessment:  Healing skin abscess on the upper chest.  Plan:  Continued to apply antibiotic ointment to the area.  If it area begins to enlarge or become more tender than return for incision and drainage. We'll prescribe Keflex to be used in the future if the area does not improve.  Return to office prn if symptoms worsen or fail to improve.   Leonard Schwartz M.D.  06/14/2011 10:27 AM

## 2011-06-14 DIAGNOSIS — L0291 Cutaneous abscess, unspecified: Secondary | ICD-10-CM | POA: Insufficient documentation

## 2011-08-23 ENCOUNTER — Ambulatory Visit: Payer: BC Managed Care – PPO | Admitting: Obstetrics and Gynecology

## 2011-08-23 ENCOUNTER — Telehealth: Payer: Self-pay

## 2011-08-23 DIAGNOSIS — N951 Menopausal and female climacteric states: Secondary | ICD-10-CM | POA: Insufficient documentation

## 2011-08-23 MED ORDER — WARFARIN SODIUM 10 MG PO TABS: 10.0000 mg | ORAL_TABLET | Freq: Every day | ORAL | Status: DC

## 2011-08-23 NOTE — Telephone Encounter (Signed)
NCQA SCREEN SHOT 

## 2011-08-24 ENCOUNTER — Ambulatory Visit (INDEPENDENT_AMBULATORY_CARE_PROVIDER_SITE_OTHER): Payer: BC Managed Care – PPO | Admitting: Obstetrics and Gynecology

## 2011-08-24 ENCOUNTER — Encounter: Payer: Self-pay | Admitting: Obstetrics and Gynecology

## 2011-08-24 VITALS — BP 132/68 | Ht 65.5 in | Wt 173.0 lb

## 2011-08-24 DIAGNOSIS — N898 Other specified noninflammatory disorders of vagina: Secondary | ICD-10-CM

## 2011-08-24 DIAGNOSIS — Z01419 Encounter for gynecological examination (general) (routine) without abnormal findings: Secondary | ICD-10-CM

## 2011-08-24 DIAGNOSIS — R1030 Lower abdominal pain, unspecified: Secondary | ICD-10-CM

## 2011-08-24 DIAGNOSIS — K5900 Constipation, unspecified: Secondary | ICD-10-CM

## 2011-08-24 DIAGNOSIS — R109 Unspecified abdominal pain: Secondary | ICD-10-CM

## 2011-08-24 LAB — POCT URINALYSIS DIPSTICK
Bilirubin, UA: NEGATIVE
Glucose, UA: NEGATIVE
Leukocytes, UA: NEGATIVE
Nitrite, UA: NEGATIVE
Urobilinogen, UA: NEGATIVE

## 2011-08-24 LAB — POCT WET PREP (WET MOUNT)
KOH Wet Prep POC: NEGATIVE
Trichomonas Wet Prep HPF POC: NEGATIVE

## 2011-08-24 NOTE — Addendum Note (Signed)
Addended by: Janine Limbo on: 08/24/2011 06:47 PM   Modules accepted: Orders

## 2011-08-24 NOTE — Progress Notes (Addendum)
Last Pap: 07/31/2007 WNL: Yes Regular Periods:no Contraception: Hysterectomy  Monthly Breast exam:yes Tetanus<3yrs:no Nl.Bladder Function:no Daily BMs:no have BM twice weekly Healthy Diet:yes Calcium:no Mammogram:yes Date of Mammogram: 01/2011 Exercise:no Have often Exercise: n/a Seatbelt: no Abuse at home: no Stressful work:no Sigmoid-colonoscopy: unsure Bone Density: Yes 2009 PCP: Dr Carylon Perches Change in PMH: none Change in [facility]: none   Pt c/o Right sided lower abd pain. Also c/o brownish d/c.  Subjective:    [name] is a 61 y.o. female G4P3 who presents for annual exam. The patient C/O bilateral lower pelvic pain with the right side being greater than the left. The patient is status post hysterectomy.  She complains of constipation.  She has one bowel movement per week.  She complains of dysuria.  The following portions of the patient's history were reviewed and updated as appropriate: allergies, current medications, past family history, past medical history, past social history, past surgical history and problem list. See above.  Review of Systems Pertinent items are noted in HPI. Gastrointestinal:No change in bowel habits, no abdominal pain, no rectal bleeding Genitourinary:negative for dysuria, frequency, hematuria, nocturia and urinary incontinence    Objective:     BP 132/68  Ht 5' 5.5" (1.664 m)  Wt 173 lb (78.472 kg)  BMI 28.35 kg/m2  Weight:  Wt Readings from Last 1 Encounters:  08/24/11 173 lb (78.472 kg)     BMI: Body mass index is 28.35 kg/(m^2). General Appearance: Alert, appropriate appearance for age. No acute distress HEENT: Grossly normal Neck / Thyroid: Supple, no masses, nodes or enlargement Lungs: clear to auscultation bilaterally Back: No CVA tenderness Breast Exam: No dimpling, nipple retraction or discharge. No masses or nodes. and No masses or nodes.No dimpling, nipple retraction or discharge. Cardiovascular: Regular rate and rhythm.  S1, S2, no murmur Gastrointestinal: Soft, non-tender, no masses or organomegaly  ++++++++++++++++++++++++++++++++++++++++++++++++++++++++  Pelvic Exam: External genitalia: normal general appearance Vaginal: atrophic mucosa Cervix: absent Adnexa: normal bimanual exam Uterus: absent Rectovaginal: normal rectal, no masses  ++++++++++++++++++++++++++++++++++++++++++++++++++++++++  Lymphatic Exam: Non-palpable nodes in neck, clavicular, axillary, or inguinal regions  Psychiatric: Alert and oriented, appropriate affect.    Urinalysis:normal  Wet prep: Negative   Assessment:    Normal gyn exam   Overweight or obese: Yes  Pelvic relaxation: Yes  Menopausal symptoms: No. Severe: No.  Bilateral pelvic pain  Constipation  Dysuria  Vaginal discharge   Plan:    Abdominal ultrasound.   Follow-up:  in 2 week(s)  Pelvic ultrasound  Urine culture  STD screen request: none,  RPR: No,  HBsAg: No.  Hepatitis C: No.  The updated Pap smear screening guidelines were discussed with the patient. The patient requested that I obtain a Pap smear: No.  Kegel exercises discussed: Yes.  Proper diet and regular exercise were reviewed.  Annual mammograms recommended starting at age 63. Proper breast care was discussed.  Screening colonoscopy is recommended beginning at age 78.  Regular health maintenance was reviewed.  Sleep hygiene was discussed.  Adequate calcium and vitamin D intake was emphasized.  Mylinda Latina.D.

## 2011-08-26 LAB — URINE CULTURE: Organism ID, Bacteria: NO GROWTH

## 2011-09-05 ENCOUNTER — Other Ambulatory Visit: Payer: Self-pay | Admitting: Obstetrics and Gynecology

## 2011-09-05 ENCOUNTER — Ambulatory Visit (INDEPENDENT_AMBULATORY_CARE_PROVIDER_SITE_OTHER): Payer: BC Managed Care – PPO | Admitting: Obstetrics and Gynecology

## 2011-09-05 ENCOUNTER — Encounter: Payer: Self-pay | Admitting: Obstetrics and Gynecology

## 2011-09-05 ENCOUNTER — Ambulatory Visit (INDEPENDENT_AMBULATORY_CARE_PROVIDER_SITE_OTHER): Payer: BC Managed Care – PPO

## 2011-09-05 VITALS — BP 100/72 | Temp 98.3°F | Resp 14 | Ht 65.0 in | Wt 174.0 lb

## 2011-09-05 DIAGNOSIS — R1030 Lower abdominal pain, unspecified: Secondary | ICD-10-CM

## 2011-09-05 DIAGNOSIS — R109 Unspecified abdominal pain: Secondary | ICD-10-CM

## 2011-09-05 DIAGNOSIS — K5900 Constipation, unspecified: Secondary | ICD-10-CM

## 2011-09-05 MED ORDER — TRAMADOL HCL 50 MG PO TABS: 50.0000 mg | ORAL_TABLET | Freq: Four times a day (QID) | ORAL | Status: AC | PRN

## 2011-09-05 NOTE — Progress Notes (Signed)
Vag. Discharge:yes Odor:no Fever:no Irreg.Periods:no Dyspareunia:yes Dysuria:no Frequency:no Urgency:no Hematuria:no Kidney stones:no Constipation:yes Diarrhea:no Rectal Bleeding: no Vomiting:no Nausea:no Pregnant:no Fibroids:no Endometriosis:no Hx of Ovarian Cyst:no Hx IUD:no Hx STD-PID:no Appendectomy:no Gall Bladder Dz:yes  HISTORY OF PRESENT ILLNESS  [name] is a 61 y.o. year old female,G4P3, who presents for a problem visit. The patient has had a hysterectomy.  She has one bowel movement each week.  She has had a cholecystectomy.  Subjective:  The patient complains of right lower quadrant pain.  Objective:  BP 100/72  Temp 98.3 F (36.8 C) (Oral)  Resp 14  Ht 5\' 5"  (1.651 m)  Wt 174 lb (78.926 kg)  BMI 28.96 kg/m2   General: alert and no distress GI: soft and nontender  Exam deferred.  Ultrasound: Normal ovaries.  Bladder unremarkable.  Normal vaginal cuff.  No free fluid seen.  Urine culture: Negative from her last visit.  Assessment:  Abdominal pain believed to be secondary to constipation.  No GYN etiology seen.  Plan:  Patient is referred back to her family physician or her gastroenterologist for evaluation and treatment.  Return to office prn if symptoms worsen or fail to improve.  Ultram 50 mg every 4-6 hours as needed for pain.  Continue to use MiraLax.  Leonard Schwartz M.D.  09/05/2011 4:14 PM

## 2011-12-05 ENCOUNTER — Encounter (HOSPITAL_COMMUNITY): Payer: Self-pay | Admitting: Emergency Medicine

## 2011-12-05 ENCOUNTER — Emergency Department (HOSPITAL_COMMUNITY)
Admission: EM | Admit: 2011-12-05 | Discharge: 2011-12-06 | Disposition: A | Discharge status: Home / Self Care | Payer: BC Managed Care – PPO | Attending: Emergency Medicine | Admitting: Emergency Medicine

## 2011-12-05 DIAGNOSIS — R300 Dysuria: Secondary | ICD-10-CM | POA: Insufficient documentation

## 2011-12-05 DIAGNOSIS — E7800 Pure hypercholesterolemia, unspecified: Secondary | ICD-10-CM | POA: Insufficient documentation

## 2011-12-05 DIAGNOSIS — Z79899 Other long term (current) drug therapy: Secondary | ICD-10-CM | POA: Insufficient documentation

## 2011-12-05 DIAGNOSIS — E039 Hypothyroidism, unspecified: Secondary | ICD-10-CM | POA: Insufficient documentation

## 2011-12-05 DIAGNOSIS — R350 Frequency of micturition: Secondary | ICD-10-CM | POA: Insufficient documentation

## 2011-12-05 DIAGNOSIS — I10 Essential (primary) hypertension: Secondary | ICD-10-CM | POA: Insufficient documentation

## 2011-12-05 DIAGNOSIS — R3915 Urgency of urination: Secondary | ICD-10-CM | POA: Insufficient documentation

## 2011-12-05 DIAGNOSIS — Z7982 Long term (current) use of aspirin: Secondary | ICD-10-CM | POA: Insufficient documentation

## 2011-12-05 NOTE — ED Notes (Signed)
Patient c/o burning and painful urination.

## 2011-12-05 NOTE — ED Notes (Signed)
Pt states she started having burning with urination last week, worse everyday since then.   States she also has some lower abdominal pain that started today.  States that her urine smelled very strong as well.  Denies any other symptoms.

## 2011-12-06 LAB — URINE MICROSCOPIC-ADD ON

## 2011-12-06 LAB — URINALYSIS, ROUTINE W REFLEX MICROSCOPIC
Glucose, UA: NEGATIVE mg/dL
Ketones, ur: NEGATIVE mg/dL
Protein, ur: 30 mg/dL — AB
Urobilinogen, UA: 0.2 mg/dL (ref 0.0–1.0)

## 2011-12-06 MED ORDER — PHENAZOPYRIDINE HCL 200 MG PO TABS: 200.0000 mg | ORAL_TABLET | Freq: Three times a day (TID) | ORAL | Status: DC

## 2011-12-06 MED ORDER — PHENAZOPYRIDINE HCL 100 MG PO TABS
200.0000 mg | ORAL_TABLET | Freq: Once | ORAL | Status: AC
  Administered 2011-12-06: 200 mg via ORAL
  Filled 2011-12-06 (×2): qty 1

## 2011-12-06 MED ORDER — NITROFURANTOIN MACROCRYSTAL 100 MG PO CAPS
100.0000 mg | ORAL_CAPSULE | Freq: Once | ORAL | Status: AC
  Administered 2011-12-06: 100 mg via ORAL
  Filled 2011-12-06: qty 1

## 2011-12-06 MED ORDER — NITROFURANTOIN MACROCRYSTAL 100 MG PO CAPS: 100.0000 mg | ORAL_CAPSULE | Freq: Two times a day (BID) | ORAL | Status: DC

## 2011-12-06 NOTE — ED Provider Notes (Signed)
History     CSN: [id number]  Arrival date & time 12/05/11  2330   First MD Initiated Contact with Patient 12/05/11 2346      Chief Complaint  Patient presents with  . Urinary Tract Infection    (Consider location/radiation/quality/duration/timing/severity/associated sxs/prior treatment) HPI  [name] is a 61 y.o. female who presents to the Emergency Department complaining of urinary frequency, urgency and dysuria. She has taken no medicines. Denies fever, chills, back pain, nausea, vomiting.  PCP Dr. Ouida Sills  Past Medical History  Diagnosis Date  . Hypercholesteremia   . Hypothyroid   . Hypertension   . Menopausal symptoms     Past Surgical History  Procedure Date  . Thyroid surgery   . Cholecystectomy   . Abdominal hysterectomy     Family History  Problem Relation Age of Onset  . Lupus Sister   . Hypertension Brother   . Diabetes Brother   . Hypertension Brother   . Diabetes Brother     History  Substance Use Topics  . Smoking status: Never Smoker   . Smokeless tobacco: Never Used  . Alcohol Use: No    OB History    Grav Para Term Preterm Abortions TAB SAB Ect Mult Living   4 3              Review of Systems  Constitutional: Negative for fever.       10 Systems reviewed and are negative for acute change except as noted in the HPI.  HENT: Negative for congestion.   Eyes: Negative for discharge and redness.  Respiratory: Negative for cough and shortness of breath.   Cardiovascular: Negative for chest pain.  Gastrointestinal: Negative for vomiting and abdominal pain.  Genitourinary: Positive for dysuria, urgency and frequency.  Musculoskeletal: Negative for back pain.  Skin: Negative for rash.  Neurological: Negative for syncope, numbness and headaches.  Psychiatric/Behavioral:       No behavior change.    Allergies  Codeine and Percocet  Home Medications   Current Outpatient Rx  Name  Route  Sig  Dispense  Refill  . ASPIRIN 81 MG PO  TABS   Oral   Take 81 mg by mouth daily.           Marland Kitchen HYDROCHLOROTHIAZIDE 25 MG PO TABS   Oral   Take 25 mg by mouth daily.           . IBUPROFEN 800 MG PO TABS   Oral   Take 800 mg by mouth every 8 (eight) hours as needed.           Marland Kitchen LEVOTHYROXINE SODIUM 88 MCG PO TABS   Oral   Take 88 mcg by mouth daily.           Marland Kitchen BIOTIN PLUS/CALCIUM/VIT D3 PO   Oral   Take by mouth.         Marland Kitchen PROSIGHT PO TABS   Oral   Take 1 tablet by mouth daily.         Marland Kitchen ROSUVASTATIN CALCIUM 5 MG PO TABS   Oral   Take 5 mg by mouth daily.           . TRAMADOL HCL 50 MG PO TABS   Oral   Take 50 mg by mouth every 6 (six) hours as needed.         Marland Kitchen ZOLPIDEM TARTRATE 10 MG PO TABS   Oral   Take 10 mg by mouth at bedtime as  needed.             BP 145/77  Temp 98.8 F (37.1 C) (Oral)  Resp 20  Ht 5\' 4"  (1.626 m)  Wt 169 lb (76.658 kg)  BMI 29.01 kg/m2  SpO2 97%  Physical Exam  Nursing note and vitals reviewed. Constitutional:       Awake, alert, nontoxic appearance.  HENT:  Head: Atraumatic.  Eyes: Right eye exhibits no discharge. Left eye exhibits no discharge.  Neck: Neck supple.  Cardiovascular: Normal heart sounds.   Pulmonary/Chest: Effort normal and breath sounds normal. She exhibits no tenderness.  Abdominal: Soft. There is no tenderness. There is no rebound.  Genitourinary:       No cva tenderness  Musculoskeletal: She exhibits no tenderness.       Baseline ROM, no obvious new focal weakness.  Neurological:       Mental status and motor strength appears baseline for patient and situation.  Skin: No rash noted.  Psychiatric: She has a normal mood and affect.    ED Course  Procedures (including critical care time) Results for orders placed during the hospital encounter of 12/05/11  URINALYSIS, ROUTINE W REFLEX MICROSCOPIC      Component Value Range   Color, Urine YELLOW  YELLOW   APPearance HAZY (*) CLEAR   Specific Gravity, Urine 1.015  1.005 -  1.030   pH 6.0  5.0 - 8.0   Glucose, UA NEGATIVE  NEGATIVE mg/dL   Hgb urine dipstick LARGE (*) NEGATIVE   Bilirubin Urine NEGATIVE  NEGATIVE   Ketones, ur NEGATIVE  NEGATIVE mg/dL   Protein, ur 30 (*) NEGATIVE mg/dL   Urobilinogen, UA 0.2  0.0 - 1.0 mg/dL   Nitrite NEGATIVE  NEGATIVE   Leukocytes, UA MODERATE (*) NEGATIVE  URINE MICROSCOPIC-ADD ON      Component Value Range   Squamous Epithelial / LPF FEW (*) RARE   WBC, UA TOO NUMEROUS TO COUNT  <3 WBC/hpf   RBC / HPF 21-50  <3 RBC/hpf   Bacteria, UA FEW (*) RARE       MDM  Patient with urgency, frequency, dysuria. UA is negative for infection. Will initiated pyridium and antibiotic. Reviewed results with patient. Pt stable in ED with no significant deterioration in condition.The patient appears reasonably screened and/or stabilized for discharge and I doubt any other medical condition or other [facility] requiring further screening, evaluation, or treatment in the ED at this time prior to discharge.  MDM Reviewed: nursing note and vitals Interpretation: labs           Nicoletta Dress. Colon Branch, MD 12/06/11 0110

## 2011-12-07 LAB — URINE CULTURE: Colony Count: >=100000

## 2011-12-08 NOTE — ED Notes (Signed)
+  Urine. Patient treated with Macrodantin. Sensitive to same. Per protocol MD. °

## 2012-01-03 ENCOUNTER — Other Ambulatory Visit: Payer: Self-pay | Admitting: Obstetrics and Gynecology

## 2012-01-03 DIAGNOSIS — Z139 Encounter for screening, unspecified: Secondary | ICD-10-CM

## 2012-02-12 ENCOUNTER — Ambulatory Visit (HOSPITAL_COMMUNITY)
Admission: RE | Admit: 2012-02-12 | Discharge: 2012-02-12 | Disposition: A | Discharge status: Home / Self Care | Payer: BC Managed Care – PPO | Source: Ambulatory Visit | Attending: Obstetrics and Gynecology | Admitting: Obstetrics and Gynecology

## 2012-02-12 DIAGNOSIS — Z139 Encounter for screening, unspecified: Secondary | ICD-10-CM

## 2012-02-12 DIAGNOSIS — Z1231 Encounter for screening mammogram for malignant neoplasm of breast: Secondary | ICD-10-CM | POA: Insufficient documentation

## 2012-05-13 ENCOUNTER — Encounter: Payer: Self-pay | Admitting: Neurology

## 2012-05-13 ENCOUNTER — Ambulatory Visit (INDEPENDENT_AMBULATORY_CARE_PROVIDER_SITE_OTHER): Payer: BC Managed Care – PPO | Admitting: Neurology

## 2012-05-13 VITALS — BP 146/78 | HR 67 | Temp 99.5°F | Ht 65.0 in | Wt 176.0 lb

## 2012-05-13 DIAGNOSIS — R519 Headache, unspecified: Secondary | ICD-10-CM | POA: Insufficient documentation

## 2012-05-13 DIAGNOSIS — R42 Dizziness and giddiness: Secondary | ICD-10-CM

## 2012-05-13 DIAGNOSIS — R51 Headache: Secondary | ICD-10-CM

## 2012-05-13 DIAGNOSIS — M542 Cervicalgia: Secondary | ICD-10-CM

## 2012-05-13 HISTORY — DX: Cervicalgia: M54.2

## 2012-05-13 NOTE — Progress Notes (Signed)
Subjective:    Patient ID: [name] is a 62 y.o. female.  HPI Interim history:  [name] is a very pleasant 62 year old lady who presents for followup consultation of her headaches and vertigo. She is unaccompanied today. This is her first visit with me and she previously followed with Dr. Fayrene Fearing love and was last seen by him in October 2013, at which time he did not find any vertigo but felt her history was suggestive. He advised her with regards to neck and lumbar spine exercises. Her falls assessment tool score was 6. Her current medications are Crestor, hydrochlorothiazide, baby aspirin, Synthroid, generic Ambien as needed, multivitamin, Lidoderm patch. She has an underlying medical history of thyroid problems, hyperlipidemia, hypertension, vertigo and headaches. She is status post hysterectomy, thyroid surgery, and cholecystectomy. She saw Dr. Eduard Clos earlier this week for her neck and back pain. She was given Ketorolac, which she finished taking and it helped. She is supposed to start gabapentin and she is getting a back brace. She has insomnia and takes Ambien. She has intermittent R sided headache. She has no particular new complaints today presents for routine six-month followup.  I reviewed Dr. Imagene Gurney prior notes and the patient's records and below is a summary of that review:  62 year old right-handed African American female, followed by Dr. love since 1997 with a history of headaches and vertigo. Her vertigo started in 1995. 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 She has had intermittent headaches and vertigo. She has also seen an outside neurologist locally who performed a modified Epley maneuver in 2010. Sleeping on her left side helps. She denies tinnitus, loss of hearing. Sleeping on the right side exacerbates neck pain. She had history of burning pain in her chest without evidence of shingles. A chest x-ray and rib x-ray from March  2012 are normal. MRI of the thoracic spine in March 2012 showed no abnormalities. She has tried gabapentin which causes her some side effects. Lyrica was of benefit. 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   Her Past Medical History Is Significant For: Past Medical History  Diagnosis Date  . Hypercholesteremia   . Hypothyroid   . Hypertension   . Menopausal symptoms     Her Past Surgical History Is Significant For: Past Surgical History  Procedure Laterality Date  . Thyroid surgery    . Cholecystectomy    . Abdominal hysterectomy      Her Family History Is Significant For: Family History  Problem Relation Age of Onset  . Lupus Sister   . Hypertension Brother   . Diabetes Brother   . Hypertension Brother   . Diabetes Brother     Her Social History Is Significant For: History   Social History  . Marital Status: Married    Spouse Name: N/A    Number of Children: N/A  . Years of Education: N/A   Social History Main Topics  . Smoking status: Never Smoker   . Smokeless tobacco: Never Used  . Alcohol Use: No  . Drug Use: No  . Sexually Active: Yes    Birth Control/ Protection: None, Surgical     Comment: HYST   Other Topics Concern  . None   Social History Narrative  . None    Her Allergies Are:  Allergies  Allergen Reactions  . Codeine   .  Percocet (Oxycodone-Acetaminophen)   :   Her Current Medications Are:  Outpatient Encounter Prescriptions as of 05/13/2012  Medication Sig Dispense Refill  . aspirin 81 MG tablet Take 81 mg by mouth daily.        Marland Kitchen gabapentin (NEURONTIN) 300 MG capsule       . hydrochlorothiazide (HYDRODIURIL) 25 MG tablet Take 25 mg by mouth daily.        Marland Kitchen ibuprofen (ADVIL,MOTRIN) 800 MG tablet Take 800 mg by mouth every 8 (eight) hours as needed.        Marland Kitchen levothyroxine (SYNTHROID, LEVOTHROID) 88 MCG  tablet Take 88 mcg by mouth daily.        . Multiple Vitamins-Minerals (BIOTIN PLUS/CALCIUM/VIT D3 PO) Take by mouth.      Marland Kitchen omeprazole (PRILOSEC) 20 MG capsule       . rosuvastatin (CRESTOR) 5 MG tablet Take 5 mg by mouth daily.        . multivitamin (PROSIGHT) TABS Take 1 tablet by mouth daily.      . nitrofurantoin (MACRODANTIN) 100 MG capsule Take 1 capsule (100 mg total) by mouth 2 (two) times daily.  10 capsule  0  . phenazopyridine (PYRIDIUM) 200 MG tablet Take 1 tablet (200 mg total) by mouth 3 (three) times daily.  6 tablet  0  . traMADol (ULTRAM) 50 MG tablet Take 50 mg by mouth every 6 (six) hours as needed.      . zolpidem (AMBIEN) 10 MG tablet Take 10 mg by mouth at bedtime as needed.         No facility-administered encounter medications on file as of 05/13/2012.    Review of Systems  Constitutional: Positive for unexpected weight change (weight gain).  Gastrointestinal: Positive for constipation.  Endocrine: Positive for cold intolerance and heat intolerance.  Musculoskeletal: Positive for myalgias and arthralgias.  Psychiatric/Behavioral: Positive for sleep disturbance (Not enough sleep).       Insomnia    Objective:  Neurologic Exam  Physical Exam Physical Examination:   Filed Vitals:   05/13/12 1202  BP: 146/78  Pulse: 67  Temp: 99.5 F (37.5 C)    General Examination: The patient is a very pleasant 62 y.o. female in no acute distress.  HEENT: Normocephalic, atraumatic, pupils are equal, round and reactive to light and accommodation. Funduscopic exam is normal with sharp disc margins noted. Extraocular tracking is good without nystagmus noted. Normal smooth pursuit is noted. Hearing is grossly intact. Tympanic membranes are clear bilaterally. Face is symmetric with normal facial animation and normal facial sensation. Speech is clear with no dysarthria noted. There is no hypophonia. There is no lip, neck or jaw tremor. Neck is supple with full range of motion.  There are no carotid bruits on auscultation. Oropharynx exam reveals normal findings. No significant airway crowding is noted. Mallampati is class II. Tongue protrudes centrally and palate elevates symmetrically.    Chest: is clear to auscultation without wheezing, rhonchi or crackles noted.  Heart: sounds are regular and normal without murmurs, rubs or gallops noted.   Abdomen: is soft, non-tender and non-distended with normal bowel sounds appreciated on auscultation.  Extremities: There is mild ankle edema and swelling of the dorsi of her feet.    Skin: is warm and dry with no trophic changes noted.  Musculoskeletal: exam reveals no obvious joint deformities, tenderness or joint swelling or erythema.  Neurologically:  Mental status: The patient is awake, alert and oriented in all 4 spheres. Her memory, attention, language and  knowledge are appropriate. There is no aphasia, agnosia, apraxia or anomia. Speech is clear with normal prosody and enunciation. Thought process is linear. Mood is congruent and affect is normal.  Cranial nerves are as described above under HEENT exam. In addition, shoulder shrug is normal with equal shoulder height noted. Motor exam: Normal bulk, strength and tone is noted. There is no drift, tremor or rebound. Romberg is negative. Reflexes are 2+ throughout.  Fine motor skills are intact with normal finger taps, normal hand movements, normal rapid alternating patting, normal foot taps and normal foot agility.  Cerebellar testing shows no dysmetria or intention tremor on finger to nose testing. There is no truncal or gait ataxia. No vertigo is reported with sudden changes in posture. Sensory exam is intact to light touch, pinprick, vibration, temperature sense and proprioception in the upper and lower extremities.  Gait, station and balance are unremarkable. No veering to one side is noted. No leaning to one side is noted. Posture is age-appropriate and stance is narrow  based. No problems turning are noted. She turns en bloc. Tandem walk is unremarkable. Intact toe and heel stance is noted.               Assessment and Plan:    Assessment and Plan:  In summary, Katti S Letina is a very pleasant 62 y.o.-year old female with a history of headaches and a history of vertigo, presenting for routine followup. She has previously been followed by Dr. love. Her exam is reassuringly nonfocal today and I reassured her in that regard as well. I had a long chat with the patient about my findings and the fact that she may have positional vertigo. Her headache seems to stem from neck pain for which she is seeing Dr. Eduard Clos with recent success with anti-inflammatory medicine and she is due to start gabapentin. I do believe that the gabapentin will help her headaches and explained that to her as well. I will have her followup in 6 months from now and we will make sure she has that appointment. She was in agreement with the plan and I did not make any changes to her medication regimen at this time. She is to call with any interim questions, concerns, problems or updates. She does have some swelling in her distal lower extremities bilaterally and is on hydrochlorothiazide. She had no further questions prior to leaving clinic today other than whether she should be using a heat pad or cold pack for her neck and back pain and I suggested cautiously trying heat pad but also warned her about the potential risk of burning with heat pads if they are used excessively. She understood.

## 2012-05-13 NOTE — Patient Instructions (Addendum)
We will have you follow up in 6 months, pls call us in July to set that up. I will also ask Andrey Campanile, our nurse to make sure you have follow up. I think overall you are doing fairly well and are stable at this point.  I do have some generic suggestions for you today:  Please make sure that you drink plenty of fluids. I would like for you to exercise daily for example in the form of walking 20-30 minutes every day, if you can. Please keep a regular sleep-wake schedule, keep regular meal times, do not skip any meals, eat  healthy snacks in between meals, such as fruit or nuts. Try to eat protein with every meal.   Engage in social activities in your community and with your family and try to keep up with current events by reading the newspaper or watching the news.  I do not think we need to make any changes in your medications at this point. I think you're stable enough that I can see you back in 6 months, sooner if we need to. Please call us if you have any interim questions, concerns, or problems or updates to need to discuss.  Brett Canales is my clinical assistant and will answer any of your questions and relay your messages to me and will give you my messages.   Our phone number is [phone]. We also have an after hours call service for urgent matters and there is a physician on-call for urgent questions. For any emergencies you know to call 911 or go to the nearest emergency room.

## 2012-10-08 ENCOUNTER — Encounter: Payer: Self-pay | Admitting: Neurology

## 2012-10-08 ENCOUNTER — Ambulatory Visit (INDEPENDENT_AMBULATORY_CARE_PROVIDER_SITE_OTHER): Payer: BC Managed Care – PPO | Admitting: Neurology

## 2012-10-08 VITALS — BP 137/76 | HR 71 | Wt 171.0 lb

## 2012-10-08 DIAGNOSIS — R42 Dizziness and giddiness: Secondary | ICD-10-CM

## 2012-10-08 DIAGNOSIS — R51 Headache: Secondary | ICD-10-CM

## 2012-10-08 MED ORDER — RIZATRIPTAN BENZOATE 10 MG PO TBDP: 10.0000 mg | ORAL_TABLET | Freq: Three times a day (TID) | ORAL | Status: DC | PRN

## 2012-10-08 NOTE — Progress Notes (Signed)
Reason for visit: Headache  Manpreet JAZZMON PRINDLE is an 62 y.o. female  History of present illness:  Ms. Porschea is a 62 year old right-handed black female with a history of episodes of vertigo and episodes of headache. The patient does not relate the headache and the vertigo together. The patient indicates that she may have 2 or 3 headaches a month, and the headaches may last 2 or 3 hours. The patient will take Advil or other over-the-counter medications for her headache. The patient indicates that the vertigo is less common, but she indicates that chocolate will bring on vertigo. The patient also has some discomfort on her right arm, and right lower extremity. The patient has some left leg pain as well. The patient is followed by Dr. Eduard Clos from the pain center for this pain issue. MRI evaluation of the cervical spine shows minimal disc bulges at the C5-6 and C6-7 levels. The patient indicates that she has undergone EMG and nerve conduction study evaluation previously, and this was unremarkable. The patient reports low back pain. The patient has chronic insomnia, and she has difficulty sleeping on her right side because of pain. The patient indicates that the right side of her neck is uncomfortable, and she has an itching sensation on the back of the head on the right. The patient has refused injections through the pain center. The patient returns for an evaluation. The patient notes that if she takes Ambien at night for sleep, she may have some dizziness the next morning when she first wakes up.  Past Medical History  Diagnosis Date  . Hypercholesteremia   . Hypothyroid   . Hypertension   . Menopausal symptoms   . Vertigo 05/13/2012  . Headache(784.0) 05/13/2012  . Neck pain 05/13/2012    Past Surgical History  Procedure Laterality Date  . Thyroid surgery    . Cholecystectomy    . Abdominal hysterectomy      Family History  Problem Relation Age of Onset  . Lupus Sister   . Hypertension  Brother   . Diabetes Brother   . Hypertension Brother   . Diabetes Brother   . Multiple sclerosis Mother   . Cancer Father     Social history:  reports that she has never smoked. She has never used smokeless tobacco. She reports that she does not drink alcohol or use illicit drugs.    Allergies  Allergen Reactions  . Codeine   . Percocet [Oxycodone-Acetaminophen]     Medications:  Current Outpatient Prescriptions on File Prior to Visit  Medication Sig Dispense Refill  . aspirin 81 MG tablet Take 81 mg by mouth daily.        . hydrochlorothiazide (HYDRODIURIL) 25 MG tablet Take 25 mg by mouth daily.        Marland Kitchen ibuprofen (ADVIL,MOTRIN) 800 MG tablet Take 800 mg by mouth every 8 (eight) hours as needed.        Marland Kitchen levothyroxine (SYNTHROID, LEVOTHROID) 88 MCG tablet Take 88 mcg by mouth daily.        . multivitamin (PROSIGHT) TABS Take 1 tablet by mouth daily.      Marland Kitchen omeprazole (PRILOSEC) 20 MG capsule Take 20 mg by mouth daily.       . rosuvastatin (CRESTOR) 5 MG tablet Take 5 mg by mouth daily.        Marland Kitchen zolpidem (AMBIEN) 10 MG tablet Take 10 mg by mouth at bedtime as needed.         No current facility-administered  medications on file prior to visit.    ROS:  Out of a complete 14 system review of symptoms, the patient complains only of the following symptoms, and all other reviewed systems are negative.  Weight gain Swelling in the legs Blurred vision, eye pain Cough Constipation Feeling hot, cold Achy muscles Headache Insomnia, dizziness  Blood pressure 137/76, pulse 71, weight 171 lb (77.565 kg).  Physical Exam  General: The patient is alert and cooperative at the time of the examination.  Neuromuscular: The patient has good range of movement of the cervical spine. The patient lacks only about 5 or 10 of full flexion of the low back, with good rotational movement.  Skin: No significant peripheral edema is noted.   Neurologic Exam  Cranial nerves: Facial  symmetry is present. Speech is normal, no aphasia or dysarthria is noted. Extraocular movements are full. Visual fields are full.  Motor: The patient has good strength in all 4 extremities.  Coordination: The patient has good finger-nose-finger and heel-to-shin bilaterally.  Gait and station: The patient has a normal gait. Tandem gait is normal. Romberg is negative. No drift is seen.  Reflexes: Deep tendon reflexes are symmetric.   Assessment/Plan:  1. History of headache, probable migraine  2. History of vertigo, questionable migraine epiphenomenon  3. Chronic neck, low back pain, right arm and leg discomfort  The patient will be given a trial on Maxalt for her headache. The patient will followup in about 6 months. If the headaches become more frequent, the use of Topamax may help both the headache and the dizziness. The patient is being followed through a pain center for her right arm and leg discomfort. The patient is not able to tolerate doses of gabapentin greater than 100 mg daily. Cymbalta or amitriptyline may be used in the future to help the sleep issues and the pain.  Marlan Palau MD 10/08/2012 7:33 PM  [facility] [address] [place], [place] [address]  Phone [phone] Fax [phone]

## 2012-11-20 ENCOUNTER — Emergency Department (HOSPITAL_COMMUNITY): Payer: BC Managed Care – PPO

## 2012-11-20 ENCOUNTER — Encounter (HOSPITAL_COMMUNITY): Payer: Self-pay | Admitting: Emergency Medicine

## 2012-11-20 ENCOUNTER — Emergency Department (HOSPITAL_COMMUNITY)
Admission: EM | Admit: 2012-11-20 | Discharge: 2012-11-20 | Disposition: A | Discharge status: Home / Self Care | Payer: BC Managed Care – PPO | Attending: Emergency Medicine | Admitting: Emergency Medicine

## 2012-11-20 DIAGNOSIS — K5900 Constipation, unspecified: Secondary | ICD-10-CM | POA: Insufficient documentation

## 2012-11-20 DIAGNOSIS — Z90710 Acquired absence of both cervix and uterus: Secondary | ICD-10-CM | POA: Insufficient documentation

## 2012-11-20 DIAGNOSIS — E039 Hypothyroidism, unspecified: Secondary | ICD-10-CM | POA: Insufficient documentation

## 2012-11-20 DIAGNOSIS — E7800 Pure hypercholesterolemia, unspecified: Secondary | ICD-10-CM | POA: Insufficient documentation

## 2012-11-20 DIAGNOSIS — Z7982 Long term (current) use of aspirin: Secondary | ICD-10-CM | POA: Insufficient documentation

## 2012-11-20 DIAGNOSIS — M545 Low back pain, unspecified: Secondary | ICD-10-CM | POA: Insufficient documentation

## 2012-11-20 DIAGNOSIS — Z9089 Acquired absence of other organs: Secondary | ICD-10-CM | POA: Insufficient documentation

## 2012-11-20 DIAGNOSIS — I10 Essential (primary) hypertension: Secondary | ICD-10-CM | POA: Insufficient documentation

## 2012-11-20 DIAGNOSIS — Z8742 Personal history of other diseases of the female genital tract: Secondary | ICD-10-CM | POA: Insufficient documentation

## 2012-11-20 DIAGNOSIS — Z79899 Other long term (current) drug therapy: Secondary | ICD-10-CM | POA: Insufficient documentation

## 2012-11-20 DIAGNOSIS — R300 Dysuria: Secondary | ICD-10-CM | POA: Insufficient documentation

## 2012-11-20 DIAGNOSIS — R109 Unspecified abdominal pain: Secondary | ICD-10-CM | POA: Insufficient documentation

## 2012-11-20 LAB — URINALYSIS, ROUTINE W REFLEX MICROSCOPIC
Bilirubin Urine: NEGATIVE
Glucose, UA: NEGATIVE mg/dL
Ketones, ur: NEGATIVE mg/dL
Leukocytes, UA: NEGATIVE
Nitrite: NEGATIVE
Protein, ur: NEGATIVE mg/dL
Specific Gravity, Urine: 1.025 (ref 1.005–1.030)
Urobilinogen, UA: 0.2 mg/dL (ref 0.0–1.0)
pH: 6 (ref 5.0–8.0)

## 2012-11-20 LAB — COMPREHENSIVE METABOLIC PANEL
ALT: 15 U/L (ref 0–35)
AST: 21 U/L (ref 0–37)
Albumin: 3.9 g/dL (ref 3.5–5.2)
Alkaline Phosphatase: 65 U/L (ref 39–117)
BUN: 14 mg/dL (ref 6–23)
CO2: 29 mEq/L (ref 19–32)
Calcium: 9.4 mg/dL (ref 8.4–10.5)
Chloride: 98 mEq/L (ref 96–112)
Creatinine, Ser: 0.87 mg/dL (ref 0.50–1.10)
GFR calc Af Amer: 80 mL/min — ABNORMAL LOW (ref >90)
GFR calc non Af Amer: 69 mL/min — ABNORMAL LOW (ref >90)
Glucose, Bld: 102 mg/dL — ABNORMAL HIGH (ref 70–99)
Potassium: 3.4 mEq/L — ABNORMAL LOW (ref 3.5–5.1)
Sodium: 137 mEq/L (ref 135–145)
Total Bilirubin: 0.4 mg/dL (ref 0.3–1.2)
Total Protein: 8.1 g/dL (ref 6.0–8.3)

## 2012-11-20 LAB — CBC WITH DIFFERENTIAL/PLATELET
Basophils Absolute: 0 10*3/uL (ref 0.0–0.1)
Basophils Relative: 0 % (ref 0–1)
Eosinophils Absolute: 0.1 10*3/uL (ref 0.0–0.7)
Eosinophils Relative: 1 % (ref 0–5)
HCT: 40.2 % (ref 36.0–46.0)
Hemoglobin: 12.9 g/dL (ref 12.0–15.0)
Lymphocytes Relative: 17 % (ref 12–46)
Lymphs Abs: 1.9 10*3/uL (ref 0.7–4.0)
MCH: 28 pg (ref 26.0–34.0)
MCHC: 32.1 g/dL (ref 30.0–36.0)
MCV: 87.4 fL (ref 78.0–100.0)
Monocytes Absolute: 0.3 10*3/uL (ref 0.1–1.0)
Monocytes Relative: 3 % (ref 3–12)
Neutro Abs: 8.8 10*3/uL — ABNORMAL HIGH (ref 1.7–7.7)
Neutrophils Relative %: 78 % — ABNORMAL HIGH (ref 43–77)
Platelets: 271 10*3/uL (ref 150–400)
RBC: 4.6 MIL/uL (ref 3.87–5.11)
RDW: 13.2 % (ref 11.5–15.5)
WBC: 11.2 10*3/uL — ABNORMAL HIGH (ref 4.0–10.5)

## 2012-11-20 LAB — URINE MICROSCOPIC-ADD ON

## 2012-11-20 LAB — LIPASE, BLOOD: Lipase: 33 U/L (ref 11–59)

## 2012-11-20 MED ORDER — ONDANSETRON HCL 4 MG/2ML IJ SOLN
4.0000 mg | Freq: Once | INTRAMUSCULAR | Status: AC
  Administered 2012-11-20: 4 mg via INTRAVENOUS
  Filled 2012-11-20: qty 2

## 2012-11-20 MED ORDER — IOHEXOL 300 MG/ML  SOLN
50.0000 mL | Freq: Once | INTRAMUSCULAR | Status: AC | PRN
  Administered 2012-11-20: 50 mL via ORAL

## 2012-11-20 MED ORDER — HYDROMORPHONE HCL PF 1 MG/ML IJ SOLN
1.0000 mg | Freq: Once | INTRAMUSCULAR | Status: AC
  Administered 2012-11-20: 1 mg via INTRAVENOUS
  Filled 2012-11-20: qty 1

## 2012-11-20 MED ORDER — KETOROLAC TROMETHAMINE 30 MG/ML IJ SOLN
30.0000 mg | Freq: Once | INTRAMUSCULAR | Status: AC
  Administered 2012-11-20: 30 mg via INTRAVENOUS
  Filled 2012-11-20: qty 1

## 2012-11-20 MED ORDER — SODIUM CHLORIDE 0.9 % IV BOLUS (SEPSIS)
1000.0000 mL | Freq: Once | INTRAVENOUS | Status: AC
  Administered 2012-11-20: 1000 mL via INTRAVENOUS

## 2012-11-20 MED ORDER — ONDANSETRON HCL 4 MG/2ML IJ SOLN: 4.0000 mg | Freq: Once | INTRAMUSCULAR | Status: DC

## 2012-11-20 MED ORDER — IOHEXOL 300 MG/ML  SOLN
100.0000 mL | Freq: Once | INTRAMUSCULAR | Status: AC | PRN
  Administered 2012-11-20: 100 mL via INTRAVENOUS

## 2012-11-20 MED ORDER — TRAMADOL HCL 50 MG PO TABS: 50.0000 mg | ORAL_TABLET | Freq: Four times a day (QID) | ORAL | Status: DC | PRN

## 2012-11-20 NOTE — ED Provider Notes (Signed)
CSN: [id number]     Arrival date & time 11/20/12  1503 History   First MD Initiated Contact with Patient 11/20/12 1544    Scribed for Raeford Razor, MD, the patient was seen in room APA18/APA18. This chart was scribed by Lewanda Rife, ED scribe. Patient's care was started at 4:00 PM  Chief Complaint  Patient presents with  . Abdominal Pain   (Consider location/radiation/quality/duration/timing/severity/associated sxs/prior Treatment) The history is provided by the patient. No language interpreter was used.   HPI Comments: [name] is a 62 y.o. female who presents to the Emergency Department complaining of waxing and waning low back pain onset this morning. Reports associated abdominal pain, burning with urination (2 weeks), constipation, and dysuria. Reports pain is exacerbated when sitting up. Denies associated nausea, diarrhea, and emesis. Reports trying 800 mg of ibuprofen, and BC powders with mild relief of symptoms. Reports PMHx of cholecystectomy and abdominal hysterectomy.       Past Medical History  Diagnosis Date  . Hypercholesteremia   . Hypothyroid   . Hypertension   . Menopausal symptoms   . Vertigo 05/13/2012  . Headache(784.0) 05/13/2012  . Neck pain 05/13/2012   Past Surgical History  Procedure Laterality Date  . Thyroid surgery    . Cholecystectomy    . Abdominal hysterectomy     Family History  Problem Relation Age of Onset  . Lupus Sister   . Hypertension Brother   . Diabetes Brother   . Hypertension Brother   . Diabetes Brother   . Multiple sclerosis Mother   . Cancer Father    History  Substance Use Topics  . Smoking status: Never Smoker   . Smokeless tobacco: Never Used  . Alcohol Use: No   OB History   Grav Para Term Preterm Abortions TAB SAB Ect Mult Living   4 3             Review of Systems  Gastrointestinal: Positive for abdominal pain.  Musculoskeletal: Positive for back pain.  A complete 10 system review of systems was  obtained and all systems are negative except as noted in the HPI and PMHx.     Allergies  Codeine and Percocet  Home Medications   Current Outpatient Rx  Name  Route  Sig  Dispense  Refill  . aspirin 81 MG tablet   Oral   Take 81 mg by mouth daily.           . fluticasone (FLONASE) 50 MCG/ACT nasal spray   Nasal   Place 50 sprays into the nose daily as needed.          . gabapentin (NEURONTIN) 100 MG capsule   Oral   Take 100 mg by mouth every morning.         . hydrochlorothiazide (HYDRODIURIL) 25 MG tablet   Oral   Take 25 mg by mouth daily.           Marland Kitchen ibuprofen (ADVIL,MOTRIN) 800 MG tablet   Oral   Take 800 mg by mouth every 8 (eight) hours as needed.           Marland Kitchen levothyroxine (SYNTHROID, LEVOTHROID) 88 MCG tablet   Oral   Take 88 mcg by mouth daily.           . multivitamin (PROSIGHT) TABS   Oral   Take 1 tablet by mouth daily.         Marland Kitchen omeprazole (PRILOSEC) 20 MG capsule   Oral  Take 20 mg by mouth daily.          . rizatriptan (MAXALT-MLT) 10 MG disintegrating tablet   Oral   Take 1 tablet (10 mg total) by mouth 3 (three) times daily as needed for migraine. May repeat in 2 hours if needed   12 tablet   3   . rosuvastatin (CRESTOR) 5 MG tablet   Oral   Take 5 mg by mouth daily.           Marland Kitchen zolpidem (AMBIEN) 10 MG tablet   Oral   Take 10 mg by mouth at bedtime as needed.            BP 137/65  Pulse 66  Temp(Src) 98.5 F (36.9 C) (Oral)  Resp 18  Ht 5\' 4"  (1.626 m)  Wt 169 lb (76.658 kg)  BMI 28.99 kg/m2  SpO2 99% Physical Exam  Nursing note and vitals reviewed. Constitutional: She appears well-developed and well-nourished. No distress.  HENT:  Head: Normocephalic and atraumatic.  Eyes: Conjunctivae are normal. Right eye exhibits no discharge. Left eye exhibits no discharge.  Neck: Neck supple.  Cardiovascular: Normal rate, regular rhythm and normal heart sounds.  Exam reveals no gallop and no friction rub.   No  murmur heard. Pulmonary/Chest: Effort normal and breath sounds normal. No respiratory distress.  Abdominal: Soft. She exhibits no distension. There is no tenderness. There is no rebound and no guarding.  Mild Right CVA tenderness   Right sided abdominal tenderness   Musculoskeletal: She exhibits no edema and no tenderness.  Neurological: She is alert.  Skin: Skin is warm and dry.  Psychiatric: She has a normal mood and affect. Her behavior is normal. Thought content normal.    ED Course  Procedures (including critical care time) COORDINATION OF CARE:  Nursing notes reviewed. Vital signs reviewed. Initial pt interview and examination performed.   4:04 PM-Discussed work up plan with pt at bedside, which includes CBC with diff panel, UA, and CMP. Pt agrees with plan.  8:13 PM Nursing Notes Reviewed/ Care Coordinated Applicable Imaging Reviewed  Interpretation of Laboratory Data incorporated into ED treatment Discussed results and treatment plan with pt. Pt demonstrates understanding and agrees with plan.  Pt informed of return precautions and is comfortable with discharge at this time.      Treatment plan initiated: Medications  sodium chloride 0.9 % bolus 1,000 mL (1,000 mLs Intravenous New Bag/Given 11/20/12 1622)  HYDROmorphone (DILAUDID) injection 1 mg (1 mg Intravenous Given 11/20/12 1623)  ondansetron (ZOFRAN) injection 4 mg (4 mg Intravenous Given 11/20/12 1622)  HYDROmorphone (DILAUDID) injection 1 mg (1 mg Intravenous Given 11/20/12 1717)  ketorolac (TORADOL) 30 MG/ML injection 30 mg (30 mg Intravenous Given 11/20/12 1717)  iohexol (OMNIPAQUE) 300 MG/ML solution 50 mL (50 mLs Oral Contrast Given 11/20/12 1850)  iohexol (OMNIPAQUE) 300 MG/ML solution 100 mL (100 mLs Intravenous Contrast Given 11/20/12 1927)     Initial diagnostic testing ordered.    Labs Review Labs Reviewed  COMPREHENSIVE METABOLIC PANEL - Abnormal; Notable for the following:    Potassium 3.4 (*)     Glucose, Bld 102 (*)    GFR calc non Af Amer 69 (*)    GFR calc Af Amer 80 (*)    All other components within normal limits  CBC WITH DIFFERENTIAL - Abnormal; Notable for the following:    WBC 11.2 (*)    Neutrophils Relative % 78 (*)    Neutro Abs 8.8 (*)    All  other components within normal limits  URINALYSIS, ROUTINE W REFLEX MICROSCOPIC - Abnormal; Notable for the following:    Hgb urine dipstick SMALL (*)    All other components within normal limits  URINE MICROSCOPIC-ADD ON - Abnormal; Notable for the following:    Squamous Epithelial / LPF FEW (*)    All other components within normal limits  LIPASE, BLOOD   Imaging Review Ct Abdomen Pelvis W Contrast  11/20/2012   CLINICAL DATA:  Abdominal pain, right-sided abdominal pain. Back pain.  EXAM: CT ABDOMEN AND PELVIS WITH CONTRAST  TECHNIQUE: Multidetector CT imaging of the abdomen and pelvis was performed using the standard protocol following bolus administration of intravenous contrast.  CONTRAST:  50mL OMNIPAQUE IOHEXOL 300 MG/ML SOLN, OMNIPAQUE IOHEXOL 300 MG/ML SOLN  COMPARISON:  None.  FINDINGS: Dependent atelectasis or scarring in the lung bases. No effusions. Heart is normal size.  Prior cholecystectomy. Calcifications within the spleen compatible with old granulomatous disease. Liver, pancreas, adrenals, left kidney unremarkable. Small cyst in the midpole of the right kidney. No hydronephrosis. No ureteral stones. Multiple calcified phleboliths within the gonadal veins and pelvic veins. Prior hysterectomy. No adnexal masses. Urinary bladder is unremarkable.  Descending colonic and sigmoid diverticulosis. No active diverticulitis. Appendix is visualized and is normal. Small bowel is decompressed. No free fluid, free air or adenopathy.  Aorta and iliac vessels are non aneurysmal.  No acute bony abnormality.  IMPRESSION: No acute findings in the abdomen or pelvis.  Normal appendix.  Descending colonic and sigmoid  diverticulosis.   Electronically Signed   By: Charlett Nose M.D.   On: 11/20/2012 19:50    EKG Interpretation   None       MDM   1. Abdominal pain    62yf with abdominal pain. Unremarkable w/u. Low suspicion for emergent process. Symptomatic tx. Return precautions discussed. outpt FU otherwise.   I personally preformed the services scribed in my presence. The recorded information has been reviewed is accurate. Raeford Razor, MD.    Raeford Razor, MD 11/21/12 1440

## 2012-11-20 NOTE — ED Notes (Signed)
abd and back pain ,onset this am.  NO NV D.  Sl dysuria.

## 2012-11-20 NOTE — ED Notes (Signed)
States pain is reduced after getting morphine.

## 2013-01-07 ENCOUNTER — Other Ambulatory Visit: Payer: Self-pay | Admitting: Obstetrics and Gynecology

## 2013-01-07 DIAGNOSIS — Z139 Encounter for screening, unspecified: Secondary | ICD-10-CM

## 2013-01-22 ENCOUNTER — Ambulatory Visit (INDEPENDENT_AMBULATORY_CARE_PROVIDER_SITE_OTHER): Payer: BC Managed Care – PPO | Admitting: Gastroenterology

## 2013-01-22 ENCOUNTER — Encounter: Payer: Self-pay | Admitting: Gastroenterology

## 2013-01-22 VITALS — BP 131/77 | HR 69 | Temp 97.9°F | Wt 164.8 lb

## 2013-01-22 DIAGNOSIS — R1310 Dysphagia, unspecified: Secondary | ICD-10-CM

## 2013-01-22 DIAGNOSIS — K5900 Constipation, unspecified: Secondary | ICD-10-CM | POA: Insufficient documentation

## 2013-01-22 DIAGNOSIS — R1013 Epigastric pain: Secondary | ICD-10-CM | POA: Insufficient documentation

## 2013-01-22 DIAGNOSIS — K3189 Other diseases of stomach and duodenum: Secondary | ICD-10-CM

## 2013-01-22 MED ORDER — PANTOPRAZOLE SODIUM 40 MG PO TBEC: 40.0000 mg | DELAYED_RELEASE_TABLET | Freq: Every day | ORAL | Status: DC

## 2013-01-22 MED ORDER — LINACLOTIDE 145 MCG PO CAPS: 145.0000 ug | ORAL_CAPSULE | Freq: Every day | ORAL | Status: DC

## 2013-01-22 NOTE — Assessment & Plan Note (Signed)
EGD as planned. Avoid NSAIDs, aspirin powders. Trial of Protonix.

## 2013-01-22 NOTE — Progress Notes (Signed)
   Primary Care Physician:  FAGAN,ROY, MD Primary Gastroenterologist:  Dr. Rourk   Chief Complaint  Patient presents with  . Dysphagia    HPI:   [name] presents today with reports of odynophagia and dysphagia. Onset a few weeks ago. Lost 10 lbs in one week. Felt like she couldn't eat. Food would "go down" but then had burning in esophagus with swallowing and epigastric discomfort. Had been on Prilosec without any improvement. Prevacid 30 mg OTC. No Ibuprofen in past few weeks but does take as needed for back or head discomfort. Feels globus sensation. Burning in esophagus and upper abdomen. No appetite. No melena. No rectal bleeding. No nausea or vomiting. BC powders in the past but last dose a few months ago. No nocturnal reflux. Constipation noted. BM every few days. Would like to trial prescription. Colonoscopy on file.   Prior EGD by our office in 2010 with Dr. Rourk noting 2  mid esophageal diverticula/Small benign cystic mucosal lesions distal esophagus of doubtful  clinical significance, stable for least 5 years/ Small hiatal hernia, otherwise normal stomach D1, D2.   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   Last empiric dilation in 2005. Prior notes in epic reveal patient was treated for H.pylori gastritis in 2005.    Past Medical History  Diagnosis Date  . Hypercholesteremia   . Hypothyroid   . Hypertension   . Menopausal symptoms   . Vertigo 05/13/2012  . Headache(784.0) 05/13/2012  . Neck pain 05/13/2012    Past Surgical History  Procedure Laterality Date  . Thyroid surgery    . Cholecystectomy    . Abdominal hysterectomy    . Esophagogastroduodenoscopy  09/17/2008    RMR:Two mid esophageal diverticula/Small benign cystic mucosal lesions distal esophagus of doubtful  clinical significance, stable for least 5 years/ Small hiatal hernia, otherwise normal stomach D1, D2  . Colonoscopy  09/17/2008    RMR:Long tortuous, but otherwise normal-appearing  colon/. Scattered diverticula  . Esophagogastroduodenoscopy    02/10/2003    NUR:Distal esophageal 3 cystic lesions without luminal compromise or evidence/Nonerosive antral gastritis/The esophagus was dilated by passing 56 French Maloney dilator.. Epic notes states +H.pylori gastritis. treatment completed per epic notes.   . Eus  Aug 2011    Dr. Conway: EGD with multiple lower esophageal submucosal nodules, stomach and duodenum normal, EUS with esophageal duplication cysts    Current Outpatient Prescriptions  Medication Sig Dispense Refill  . aspirin EC 81 MG tablet Take 81 mg by mouth daily.      . fluticasone (FLONASE) 50 MCG/ACT nasal spray Place 50 sprays into the nose daily as needed for allergies.       . hydrochlorothiazide (HYDRODIURIL) 25 MG tablet Take 25 mg by mouth daily.        . ibuprofen (ADVIL,MOTRIN) 800 MG tablet Take 800 mg by mouth every 8 (eight) hours as needed for pain.       . lansoprazole (PREVACID) 15 MG capsule Take 15 mg by mouth daily at 12 noon.      . levothyroxine (SYNTHROID, LEVOTHROID) 88 MCG tablet Take 88 mcg by mouth daily.        . multivitamin (PROSIGHT) TABS Take 1 tablet by mouth daily.      . rizatriptan (MAXALT-MLT) 10 MG disintegrating tablet Take 1 tablet (10 mg total) by mouth 3 (three) times daily as needed for migraine. May repeat in 2 hours if needed  12 tablet  3  . rosuvastatin (CRESTOR) 5   MG tablet Take 5 mg by mouth daily.        . zolpidem (AMBIEN) 10 MG tablet Take 10 mg by mouth at bedtime as needed for sleep.       . Linaclotide (LINZESS) 145 MCG CAPS capsule Take 1 capsule (145 mcg total) by mouth daily. 30 minutes before breakfast  30 capsule  3  . pantoprazole (PROTONIX) 40 MG tablet Take 1 tablet (40 mg total) by mouth daily. Take 30 minutes before first meal of the day  30 tablet  3   No current facility-administered medications for this visit.    Allergies as of 01/22/2013 - Review Complete 01/22/2013  Allergen Reaction Noted    . Codeine    . Percocet [oxycodone-acetaminophen]  12/04/2010    Family History  Problem Relation Age of Onset  . Lupus Sister   . Hypertension Brother   . Diabetes Brother   . Hypertension Brother   . Diabetes Brother   . Multiple sclerosis Mother   . Cancer Father   . Colon cancer Neg Hx     History   Social History  . Marital Status: Married    Spouse Name: N/A    Number of Children: N/A  . Years of Education: N/A   Occupational History  . Not on file.   Social History Main Topics  . Smoking status: Never Smoker   . Smokeless tobacco: Never Used  . Alcohol Use: No  . Drug Use: No  . Sexual Activity: Yes    Birth Control/ Protection: None, Surgical     Comment: HYST   Other Topics Concern  . Not on file   Social History Narrative  . No narrative on file    Review of Systems: Gen: Denies any fever, chills, fatigue, weight loss, lack of appetite.  CV: Denies chest pain, heart palpitations, peripheral edema, syncope.  Resp: Denies shortness of breath at rest or with exertion. Denies wheezing or cough.  GI: See HPI GU : Denies urinary burning, urinary frequency, urinary hesitancy MS: back pain Derm: Denies rash, itching, dry skin Psych: Denies depression, anxiety, memory loss, and confusion Heme: Denies bruising, bleeding, and enlarged lymph nodes.  Physical Exam: BP 131/77  Pulse 69  Temp(Src) 97.9 F (36.6 C) (Oral)  Wt 164 lb 12.8 oz (74.753 kg) General:   Alert and oriented. Pleasant and cooperative. Well-nourished and well-developed.  Head:  Normocephalic and atraumatic. Eyes:  Without icterus, sclera clear and conjunctiva pink.  Ears:  Normal auditory acuity. Nose:  No deformity, discharge,  or lesions. Mouth:  No deformity or lesions, oral mucosa pink.  Neck:  Supple, without mass or thyromegaly. Lungs:  Clear to auscultation bilaterally. No wheezes, rales, or rhonchi. No distress.  Heart:  S1, S2 present without murmurs appreciated.   Abdomen:  +BS, soft, non-tender and non-distended. No HSM noted. No guarding or rebound. No masses appreciated.  Rectal:  Deferred  Msk:  Symmetrical without gross deformities. Normal posture. Pulses:  Normal pulses noted. Extremities:  Without clubbing or edema. Neurologic:  Alert and  oriented x4;  grossly normal neurologically. Skin:  Intact without significant lesions or rashes. Cervical Nodes:  No significant cervical adenopathy. Psych:  Alert and cooperative. Normal mood and affect.    

## 2013-01-22 NOTE — Assessment & Plan Note (Addendum)
Colonoscopy on file. No rectal bleeding. No concerning signs. Start Linzess 145 mcg daily. Supplemental fiber daily. Voucher and prescription provided.

## 2013-01-22 NOTE — Patient Instructions (Addendum)
We have set you up for an upper endoscopy with dilation if needed with Dr. Jena Gauss.   For reflux: stop Prevacid. Start taking Protonix once each morning, 30 minutes before breakfast. I sent this to your pharmacy.  For constipation: start taking Metamucil or Benefiber daily. These are fiber supplements. I have also provided a voucher for a constipation medication called Linzess. You take 1 capsule each morning on an empty stomach, at least 30 minutes before breakfast. The one side effect to watch for is diarrhea. Sometimes, after a few days, diarrhea will stop. If you like this medication, you can continue to have it filled at your pharmacy. I have sent refills.   Have a [employer] Christmas!  Diet for Gastroesophageal Reflux Disease, Adult Reflux (acid reflux) is when acid from your stomach flows up into the esophagus. When acid comes in contact with the esophagus, the acid causes irritation and soreness (inflammation) in the esophagus. When reflux happens often or so severely that it causes damage to the esophagus, it is called gastroesophageal reflux disease (GERD). Nutrition therapy can help ease the discomfort of GERD. FOODS OR DRINKS TO AVOID OR LIMIT  Smoking or chewing tobacco. Nicotine is one of the most potent stimulants to acid production in the gastrointestinal tract.  Caffeinated and decaffeinated coffee and black tea.  Regular or low-calorie carbonated beverages or energy drinks (caffeine-free carbonated beverages are allowed).   Strong spices, such as black pepper, white pepper, red pepper, cayenne, curry powder, and chili powder.  Peppermint or spearmint.  Chocolate.  High-fat foods, including meats and fried foods. Extra added fats including oils, butter, salad dressings, and nuts. Limit these to less than 8 tsp per day.  Fruits and vegetables if they are not tolerated, such as citrus fruits or tomatoes.  Alcohol.  Any food that seems to aggravate your condition. If you have  questions regarding your diet, call your caregiver or a registered dietitian. OTHER THINGS THAT MAY HELP GERD INCLUDE:   Eating your meals slowly, in a relaxed setting.  Eating 5 to 6 small meals per day instead of 3 large meals.  Eliminating food for a period of time if it causes distress.  Not lying down until 3 hours after eating a meal.  Keeping the head of your bed raised 6 to 9 inches (15 to 23 cm) by using a foam wedge or blocks under the legs of the bed. Lying flat may make symptoms worse.  Being physically active. Weight loss may be helpful in reducing reflux in overweight or obese adults.  Wear loose fitting clothing EXAMPLE MEAL PLAN This meal plan is approximately 2,000 calories based on https://www.[facility].org/ meal planning guidelines. Breakfast   cup cooked oatmeal.  1 cup strawberries.  1 cup low-fat milk.  1 oz almonds. Snack  1 cup cucumber slices.  6 oz yogurt (made from low-fat or fat-free milk). Lunch  2 slice whole-wheat bread.  2 oz sliced [place].  2 tsp mayonnaise.  1 cup blueberries.  1 cup snap peas. Snack  6 whole-wheat crackers.  1 oz string cheese. Dinner   cup brown rice.  1 cup mixed veggies.  1 tsp olive oil.  3 oz grilled fish. Document Released: 01/16/2005 Document Revised: 04/10/2011 Document Reviewed: 12/02/2010 [facility] Patient Information 2014 [place], [place].

## 2013-01-22 NOTE — Assessment & Plan Note (Signed)
62 year old female with new onset dysphagia and odynophagia, no improvement with Prilosec or Prevacid. Historically has taken Ibuprofen and BC powders but has cut back on this. Globus sensation also noted. 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 Last dilation done empirically in 2005. Needs EGD/ED in near future with Dr. Jena Gauss. Consider BPE as next step, possible referral to ENT if persistent globus sensation.   Proceed with upper endoscopy/dilation in the near future with Dr. Jena Gauss. The risks, benefits, and alternatives have been discussed in detail with patient. They have stated understanding and desire to proceed.  Trial of Protonix daily; sent [facility]

## 2013-01-27 NOTE — Progress Notes (Signed)
cc'd to pcp 

## 2013-01-28 ENCOUNTER — Encounter (HOSPITAL_COMMUNITY): Payer: Self-pay | Admitting: Pharmacy Technician

## 2013-02-03 ENCOUNTER — Ambulatory Visit (HOSPITAL_COMMUNITY)
Admission: RE | Admit: 2013-02-03 | Discharge: 2013-02-03 | Disposition: A | Discharge status: Home / Self Care | Payer: BC Managed Care – PPO | Source: Ambulatory Visit | Attending: Internal Medicine | Admitting: Internal Medicine

## 2013-02-03 ENCOUNTER — Encounter (HOSPITAL_COMMUNITY): Payer: Self-pay | Admitting: *Deleted

## 2013-02-03 ENCOUNTER — Encounter (HOSPITAL_COMMUNITY)
Admission: RE | Disposition: A | Discharge status: Home / Self Care | Payer: Self-pay | Source: Ambulatory Visit | Attending: Internal Medicine

## 2013-02-03 DIAGNOSIS — K2289 Other specified disease of esophagus: Secondary | ICD-10-CM | POA: Insufficient documentation

## 2013-02-03 DIAGNOSIS — K228 Other specified diseases of esophagus: Secondary | ICD-10-CM | POA: Insufficient documentation

## 2013-02-03 DIAGNOSIS — K225 Diverticulum of esophagus, acquired: Secondary | ICD-10-CM | POA: Insufficient documentation

## 2013-02-03 DIAGNOSIS — Z79899 Other long term (current) drug therapy: Secondary | ICD-10-CM | POA: Insufficient documentation

## 2013-02-03 DIAGNOSIS — R1310 Dysphagia, unspecified: Secondary | ICD-10-CM | POA: Insufficient documentation

## 2013-02-03 DIAGNOSIS — R1013 Epigastric pain: Secondary | ICD-10-CM

## 2013-02-03 DIAGNOSIS — I10 Essential (primary) hypertension: Secondary | ICD-10-CM | POA: Insufficient documentation

## 2013-02-03 HISTORY — PX: ESOPHAGOGASTRODUODENOSCOPY (EGD) WITH ESOPHAGEAL DILATION: SHX5812

## 2013-02-03 SURGERY — ESOPHAGOGASTRODUODENOSCOPY (EGD) WITH ESOPHAGEAL DILATION
Anesthesia: Moderate Sedation

## 2013-02-03 MED ORDER — STERILE WATER FOR IRRIGATION IR SOLN
Status: DC | PRN
  Administered 2013-02-03: 09:00:00

## 2013-02-03 MED ORDER — SODIUM CHLORIDE 0.9 % IV SOLN
INTRAVENOUS | Status: DC
  Administered 2013-02-03: 08:00:00 via INTRAVENOUS

## 2013-02-03 MED ORDER — MIDAZOLAM HCL 5 MG/5ML IJ SOLN
INTRAMUSCULAR | Status: AC
  Filled 2013-02-03: qty 10

## 2013-02-03 MED ORDER — BUTAMBEN-TETRACAINE-BENZOCAINE 2-2-14 % EX AERO
INHALATION_SPRAY | CUTANEOUS | Status: DC | PRN
  Administered 2013-02-03: 2 via TOPICAL

## 2013-02-03 MED ORDER — MIDAZOLAM HCL 5 MG/5ML IJ SOLN
INTRAMUSCULAR | Status: DC | PRN
  Administered 2013-02-03: 1 mg via INTRAVENOUS
  Administered 2013-02-03 (×2): 2 mg via INTRAVENOUS

## 2013-02-03 MED ORDER — ONDANSETRON HCL 4 MG/2ML IJ SOLN
INTRAMUSCULAR | Status: AC
  Filled 2013-02-03: qty 2

## 2013-02-03 MED ORDER — ONDANSETRON HCL 4 MG/2ML IJ SOLN
INTRAMUSCULAR | Status: DC | PRN
  Administered 2013-02-03: 4 mg via INTRAVENOUS

## 2013-02-03 MED ORDER — MEPERIDINE HCL 100 MG/ML IJ SOLN
INTRAMUSCULAR | Status: DC | PRN
  Administered 2013-02-03: 50 mg via INTRAVENOUS
  Administered 2013-02-03: 25 mg via INTRAVENOUS

## 2013-02-03 MED ORDER — MEPERIDINE HCL 100 MG/ML IJ SOLN
INTRAMUSCULAR | Status: AC
  Filled 2013-02-03: qty 2

## 2013-02-03 NOTE — H&P (View-Only) (Signed)
Primary Care Physician:  Asencion Noble, MD Primary Gastroenterologist:  Dr. Gala Romney   Chief Complaint  Patient presents with  . Dysphagia    HPI:   [name] presents today with reports of odynophagia and dysphagia. Onset a few weeks ago. Lost 10 lbs in one week. Felt like she couldn't eat. Food would "go down" but then had burning in esophagus with swallowing and epigastric discomfort. Had been on Prilosec without any improvement. Prevacid 30 mg OTC. No Ibuprofen in past few weeks but does take as needed for back or head discomfort. Feels globus sensation. Burning in esophagus and upper abdomen. No appetite. No melena. No rectal bleeding. No nausea or vomiting. BC powders in the past but last dose a few months ago. No nocturnal reflux. Constipation noted. BM every few days. Would like to trial prescription. Colonoscopy on file.   Prior EGD by our office in 2010 with Dr. Gala Romney noting 2  mid esophageal diverticula/Small benign cystic mucosal lesions distal esophagus of doubtful  clinical significance, stable for least 5 years/ Small hiatal hernia, otherwise normal stomach D1, D2.   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   Last empiric dilation in 2005. Prior notes in epic reveal patient was treated for H.pylori gastritis in 2005.    Past Medical History  Diagnosis Date  . Hypercholesteremia   . Hypothyroid   . Hypertension   . Menopausal symptoms   . Vertigo 05/13/2012  . Headache(784.0) 05/13/2012  . Neck pain 05/13/2012    Past Surgical History  Procedure Laterality Date  . Thyroid surgery    . Cholecystectomy    . Abdominal hysterectomy    . Esophagogastroduodenoscopy  09/17/2008    RMR:Two mid esophageal diverticula/Small benign cystic mucosal lesions distal esophagus of doubtful  clinical significance, stable for least 5 years/ Small hiatal hernia, otherwise normal stomach D1, D2  . Colonoscopy  09/17/2008    ZSW:FUXN tortuous, but otherwise normal-appearing  colon/. Scattered diverticula  . Esophagogastroduodenoscopy    02/10/2003    ATF:TDDUKG esophageal 3 cystic lesions without luminal compromise or evidence/Nonerosive antral gastritis/The esophagus was dilated by passing 56 [place] Maloney dilator.Marland Kitchen Epic notes states +H.pylori gastritis. treatment completed per epic notes.   . Eus  Aug 2011    Dr. Newman Pies: EGD with multiple lower esophageal submucosal nodules, stomach and duodenum normal, EUS with esophageal duplication cysts    Current Outpatient Prescriptions  Medication Sig Dispense Refill  . aspirin EC 81 MG tablet Take 81 mg by mouth daily.      . fluticasone (FLONASE) 50 MCG/ACT nasal spray Place 50 sprays into the nose daily as needed for allergies.       . hydrochlorothiazide (HYDRODIURIL) 25 MG tablet Take 25 mg by mouth daily.        Marland Kitchen ibuprofen (ADVIL,MOTRIN) 800 MG tablet Take 800 mg by mouth every 8 (eight) hours as needed for pain.       Marland Kitchen lansoprazole (PREVACID) 15 MG capsule Take 15 mg by mouth daily at 12 noon.      Marland Kitchen levothyroxine (SYNTHROID, LEVOTHROID) 88 MCG tablet Take 88 mcg by mouth daily.        . multivitamin (PROSIGHT) TABS Take 1 tablet by mouth daily.      . rizatriptan (MAXALT-MLT) 10 MG disintegrating tablet Take 1 tablet (10 mg total) by mouth 3 (three) times daily as needed for migraine. May repeat in 2 hours if needed  12 tablet  3  . rosuvastatin (CRESTOR) 5  MG tablet Take 5 mg by mouth daily.        Marland Kitchen zolpidem (AMBIEN) 10 MG tablet Take 10 mg by mouth at bedtime as needed for sleep.       . Linaclotide (LINZESS) 145 MCG CAPS capsule Take 1 capsule (145 mcg total) by mouth daily. 30 minutes before breakfast  30 capsule  3  . pantoprazole (PROTONIX) 40 MG tablet Take 1 tablet (40 mg total) by mouth daily. Take 30 minutes before first meal of the day  30 tablet  3   No current facility-administered medications for this visit.    Allergies as of 01/22/2013 - Review Complete 01/22/2013  Allergen Reaction Noted    . Codeine    . Percocet [oxycodone-acetaminophen]  12/04/2010    Family History  Problem Relation Age of Onset  . Lupus Sister   . Hypertension Brother   . Diabetes Brother   . Hypertension Brother   . Diabetes Brother   . Multiple sclerosis Mother   . Cancer Father   . Colon cancer Neg Hx     History   Social History  . Marital Status: Married    Spouse Name: N/A    Number of Children: N/A  . Years of Education: N/A   Occupational History  . Not on file.   Social History Main Topics  . Smoking status: Never Smoker   . Smokeless tobacco: Never Used  . Alcohol Use: No  . Drug Use: No  . Sexual Activity: Yes    Birth Control/ Protection: None, Surgical     Comment: HYST   Other Topics Concern  . Not on file   Social History Narrative  . No narrative on file    Review of Systems: Gen: Denies any fever, chills, fatigue, weight loss, lack of appetite.  CV: Denies chest pain, heart palpitations, peripheral edema, syncope.  Resp: Denies shortness of breath at rest or with exertion. Denies wheezing or cough.  GI: See HPI GU : Denies urinary burning, urinary frequency, urinary hesitancy MS: back pain Derm: Denies rash, itching, dry skin Psych: Denies depression, anxiety, memory loss, and confusion Heme: Denies bruising, bleeding, and enlarged lymph nodes.  Physical Exam: BP 131/77  Pulse 69  Temp(Src) 97.9 F (36.6 C) (Oral)  Wt 164 lb 12.8 oz (74.753 kg) General:   Alert and oriented. Pleasant and cooperative. Well-nourished and well-developed.  Head:  Normocephalic and atraumatic. Eyes:  Without icterus, sclera clear and conjunctiva pink.  Ears:  Normal auditory acuity. Nose:  No deformity, discharge,  or lesions. Mouth:  No deformity or lesions, oral mucosa pink.  Neck:  Supple, without mass or thyromegaly. Lungs:  Clear to auscultation bilaterally. No wheezes, rales, or rhonchi. No distress.  Heart:  S1, S2 present without murmurs appreciated.   Abdomen:  +BS, soft, non-tender and non-distended. No HSM noted. No guarding or rebound. No masses appreciated.  Rectal:  Deferred  Msk:  Symmetrical without gross deformities. Normal posture. Pulses:  Normal pulses noted. Extremities:  Without clubbing or edema. Neurologic:  Alert and  oriented x4;  grossly normal neurologically. Skin:  Intact without significant lesions or rashes. Cervical Nodes:  No significant cervical adenopathy. Psych:  Alert and cooperative. Normal mood and affect.

## 2013-02-03 NOTE — Interval H&P Note (Signed)
History and Physical Interval Note:  02/03/2013 8:40 AM  [name]  has presented today for surgery, with the diagnosis of DYSPHAGIA  AND DYSPEPSIA  The various methods of treatment have been discussed with the patient and family. After consideration of risks, benefits and other options for treatment, the patient has consented to  Procedure(s) with comments: ESOPHAGOGASTRODUODENOSCOPY (EGD) WITH ESOPHAGEAL DILATION (N/A) - 8:45 as a surgical intervention .  The patient's history has been reviewed, patient examined, no change in status, stable for surgery.  I have reviewed the patient's chart and labs.  Questions were answered to the patient's satisfaction.     Chris Narasimhan   Dysphagia and odynophagia not much changed. EGD with possible esophageal dilation per plan.  The risks, benefits, limitations, alternatives and imponderables have been reviewed with the patient. Potential for esophageal dilation, biopsy, etc. have also been reviewed.  Questions have been answered. All parties agreeable.

## 2013-02-03 NOTE — Op Note (Signed)
[facility] [address] [place], [address]   ENDOSCOPY PROCEDURE REPORT  PATIENT: [name], [name]  MR#: [id number] BIRTHDATE: September 24, 1950 , 30  yrs. old GENDER: Female ENDOSCOPIST: R.  Garfield Cornea, MD FACP FACG REFERRED BY:  Asencion Noble, M.D. PROCEDURE DATE:  02/03/2013 PROCEDURE:     EGD with Venia Minks dilation  INDICATIONS:     Recurrent esophageal dysphagia and odynophagia  INFORMED CONSENT:   The risks, benefits, limitations, alternatives and imponderables have been discussed.  The potential for biopsy, esophogeal dilation, etc. have also been reviewed.  Questions have been answered.  All parties agreeable.  Please see the history and physical in the medical record for more information.  MEDICATIONS:    Versed 5 mg IVand Demerol 75 mg IV in divided doses. Zofran 4 mg IV.   Cetacaine spray  DESCRIPTION OF PROCEDURE:   The EG-[id number] ([id number])  endoscope was introduced through the mouth and advanced to the second portion of the duodenum without difficulty or limitations.  The mucosal surfaces were surveyed very carefully during advancement of the scope and upon withdrawal.  Retroflexion view of the proximal stomach and esophagogastric junction was performed.      FINDINGS: (3) 2-3 mm  cystic lesions in the distal esophagus as previously noted. I Identified 1 shallow mid-esophageal diverticulum; otherwise, the esophageal mucosa appeared normal. The tubular esophagus appeared patent throughout its course.  Stomach empty. Normal gastric mucosa. Patent pylorus. Normal first and second portion of the duodenum  THERAPEUTIC / DIAGNOSTIC MANEUVERS PERFORMED:  A 54 French Maloney dilator was passed carefully to full insertion without resistance. A look back revealed no apparent complication related to this maneuver.   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 Otherwise;  EGD normal - Widely patent  tubular esophagus before and after dilation.  Status post passage of a Maloney dilator.  RECOMMENDATIONS:   Continue Protonix 40 mg daily. Office followup 3 months    _______________________________ R. Garfield Cornea, MD FACP [facility] eSigned:  R. Garfield Cornea, MD FACP [facility] 02/03/2013 9:18 AM     CC:

## 2013-02-03 NOTE — Discharge Instructions (Addendum)
EGD Discharge instructions Please read the instructions outlined below and refer to this sheet in the next few weeks. These discharge instructions provide you with general information on caring for yourself after you leave the hospital. Your doctor may also give you specific instructions. While your treatment has been planned according to the most current medical practices available, unavoidable complications occasionally occur. If you have any problems or questions after discharge, please call your doctor. ACTIVITY  You may resume your regular activity but move at a slower pace for the next 24 hours.   Take frequent rest periods for the next 24 hours.   Walking will help expel (get rid of) the air and reduce the bloated feeling in your abdomen.   No driving for 24 hours (because of the anesthesia (medicine) used during the test).   You may shower.   Do not sign any important legal documents or operate any machinery for 24 hours (because of the anesthesia used during the test).  NUTRITION  Drink plenty of fluids.   You may resume your normal diet.   Begin with a light meal and progress to your normal diet.   Avoid alcoholic beverages for 24 hours or as instructed by your caregiver.  MEDICATIONS  You may resume your normal medications unless your caregiver tells you otherwise.  WHAT YOU CAN EXPECT TODAY  You may experience abdominal discomfort such as a feeling of fullness or gas pains.  FOLLOW-UP  Your doctor will discuss the results of your test with you.  SEEK IMMEDIATE MEDICAL ATTENTION IF ANY OF THE FOLLOWING OCCUR:  Excessive nausea (feeling sick to your stomach) and/or vomiting.   Severe abdominal pain and distention (swelling).   Trouble swallowing.   Temperature over 101 F (37.8 C).   Rectal bleeding or vomiting of blood.    Office visit with [place] in 3 months  Continue Protonix daily

## 2013-02-05 ENCOUNTER — Encounter (HOSPITAL_COMMUNITY): Payer: Self-pay | Admitting: Internal Medicine

## 2013-02-13 ENCOUNTER — Ambulatory Visit (HOSPITAL_COMMUNITY)
Admission: RE | Admit: 2013-02-13 | Discharge: 2013-02-13 | Disposition: A | Discharge status: Home / Self Care | Payer: BC Managed Care – PPO | Source: Ambulatory Visit | Attending: Obstetrics and Gynecology | Admitting: Obstetrics and Gynecology

## 2013-02-13 ENCOUNTER — Other Ambulatory Visit: Payer: Self-pay | Admitting: Obstetrics and Gynecology

## 2013-02-13 DIAGNOSIS — Z139 Encounter for screening, unspecified: Secondary | ICD-10-CM

## 2013-02-13 DIAGNOSIS — N63 Unspecified lump in unspecified breast: Secondary | ICD-10-CM

## 2013-02-18 ENCOUNTER — Ambulatory Visit: Payer: BC Managed Care – PPO | Admitting: Gastroenterology

## 2013-02-19 ENCOUNTER — Ambulatory Visit (HOSPITAL_COMMUNITY)
Admission: RE | Admit: 2013-02-19 | Discharge: 2013-02-19 | Disposition: A | Discharge status: Home / Self Care | Payer: BC Managed Care – PPO | Source: Ambulatory Visit | Attending: Obstetrics and Gynecology | Admitting: Obstetrics and Gynecology

## 2013-02-19 ENCOUNTER — Other Ambulatory Visit: Payer: Self-pay | Admitting: Obstetrics and Gynecology

## 2013-02-19 ENCOUNTER — Other Ambulatory Visit (HOSPITAL_COMMUNITY): Payer: Self-pay | Admitting: Obstetrics and Gynecology

## 2013-02-19 ENCOUNTER — Encounter (HOSPITAL_COMMUNITY): Payer: Self-pay

## 2013-02-19 DIAGNOSIS — N63 Unspecified lump in unspecified breast: Secondary | ICD-10-CM

## 2013-02-19 DIAGNOSIS — Z1231 Encounter for screening mammogram for malignant neoplasm of breast: Secondary | ICD-10-CM | POA: Insufficient documentation

## 2013-02-19 DIAGNOSIS — C50919 Malignant neoplasm of unspecified site of unspecified female breast: Secondary | ICD-10-CM

## 2013-02-19 HISTORY — DX: Malignant neoplasm of unspecified site of unspecified female breast: C50.919

## 2013-02-19 MED ORDER — LIDOCAINE HCL (PF) 2 % IJ SOLN
10.0000 mL | Freq: Once | INTRAMUSCULAR | Status: AC
  Administered 2013-02-19: 10 mL

## 2013-02-19 MED ORDER — LIDOCAINE HCL (PF) 2 % IJ SOLN
INTRAMUSCULAR | Status: AC
Start: 2013-02-19 — End: 2013-02-19
  Filled 2013-02-19: qty 10

## 2013-02-19 NOTE — Discharge Instructions (Signed)
Breast Biopsy A breast biopsy is a test during which a sample of tissue is taken from your breast. The breast tissue is looked at under a microscope for cancer cells.  BEFORE THE PROCEDURE  Make plans to have someone drive you home after the test.  Do not smoke for 2 weeks before the test. Stop smoking, if you smoke.  Do not drink alcohol for 24 hours before the test.  Wear a good support bra to the test. PROCEDURE  You may be given one of the following:  A medicine to numb the breast area (local anesthesia).  A medicine to make you sleep (general anesthesia). There are different types of breast biopsies. They include:  Fine-needle aspiration.  A needle is put into the breast lump.  The needle takes out fluid and cells from the lump.  Ultrasound imaging may be used to help find the lump and to put the needle it the right spot.  Core-needle biopsy.  A needle is put into the breast lump.  The needle is put in your breast 3 6 times.  The needle removes breast tissue.  An ultrasound image or X-ray is often used to find the right spot to put in the needle.  Stereotactic biopsy.  X-rays and a computer are used to study X-ray pictures of the breast lump.  The computer finds where the needle needs to be put into the breast.  Tissue samples are taken out.  Vacuum-assisted biopsy.  A small cut (incision) is made in your breast.  A biopsy device is put through the cut and into the breast tissue.  The biopsy device draws abnormal breast tissue into the biopsy device.  A large tissue sample is often removed.  No stitches are needed.  Ultrasound-guided core-needle biopsy.  Ultrasound imaging helps guide the needle into the area of the breast that is not normal.  A cut is made in the breast. The needle is put in the needle.  Tissue samples are taken out.  Open biopsy.  A large cut is made in the breast.  Your doctor will try to remove the whole breast lump or as  much as possible. All tissue, fluid, or cell samples are looked at under a microscope.  AFTER THE PROCEDURE  You will be taken to an area to recover. You will be able to go home once you are doing well and are without problems.  You may have bruising on your breast. This is normal.  A pressure bandage (dressing) may be put on your breast for 24 8 hours. This type of bandage is wrapped tightly around your chest. It helps stop fluid from building up underneath tissues. Document Released: 04/10/2011 Document Reviewed: 04/10/2011 [facility] Patient Information 2014 [place].  Breast Biopsy Care After These instructions give you information on caring for yourself after your procedure. Your doctor may also give you more specific instructions. Call your doctor if you have any problems or questions after your procedure. HOME CARE  Only take medicine as told by your doctor.  Do not take aspirin.  Keep your sutures (stitches) dry when bathing.  Protect the biopsy area. Do not let the area get bumped.  Avoid activities that could pull the biopsy site open until your doctor approves. This includes:  Stretching.  Reaching.  Exercise.  Sports.  Lifting more than 3lb.  Continue your normal diet.  Wear a good support bra for as long as told by your doctor.  Change any bandages (dressings) as  told by your doctor.  Do not drink alcohol while taking pain medicine.  Keep all doctor visits as told. Ask when your test results will be ready. Make sure you get your test results. GET HELP RIGHT AWAY IF:   You have a fever.  You have more bleeding (more than a small spot) from the biopsy site.  You have trouble breathing.  You have yellowish-white fluid (pus) coming from the biopsy site.  You have redness, puffiness (swelling), or more pain in the biopsy site.  You have a bad smell coming from the biopsy site.  Your biopsy site opens after sutures, staples, or sticky strips  have been removed.  You have a rash.  You need stronger medicine. MAKE SURE YOU:  Understand these instructions.  Will watch your condition.  Will get help right away if you are not doing well or get worse. Document Released: 11/12/2008 Document Revised: 04/10/2011 Document Reviewed: 02/26/2011 [facility] Patient Information 2014 [place].

## 2013-02-21 ENCOUNTER — Other Ambulatory Visit: Payer: Self-pay | Admitting: Obstetrics and Gynecology

## 2013-02-21 DIAGNOSIS — D0510 Intraductal carcinoma in situ of unspecified breast: Secondary | ICD-10-CM

## 2013-02-24 ENCOUNTER — Telehealth: Payer: Self-pay | Admitting: *Deleted

## 2013-02-24 DIAGNOSIS — C50411 Malignant neoplasm of upper-outer quadrant of right female breast: Secondary | ICD-10-CM | POA: Insufficient documentation

## 2013-02-24 NOTE — Telephone Encounter (Signed)
Confirmed [facility] for 03/05/13 at 0800 .  Instructions and contact information given. 

## 2013-02-27 ENCOUNTER — Ambulatory Visit
Admission: RE | Admit: 2013-02-27 | Discharge: 2013-02-27 | Disposition: A | Discharge status: Home / Self Care | Payer: BC Managed Care – PPO | Source: Ambulatory Visit | Attending: Obstetrics and Gynecology | Admitting: Obstetrics and Gynecology

## 2013-02-27 DIAGNOSIS — D0510 Intraductal carcinoma in situ of unspecified breast: Secondary | ICD-10-CM

## 2013-02-27 MED ORDER — GADOBENATE DIMEGLUMINE 529 MG/ML IV SOLN
15.0000 mL | Freq: Once | INTRAVENOUS | Status: AC | PRN
  Administered 2013-02-27: 15 mL via INTRAVENOUS

## 2013-03-03 ENCOUNTER — Other Ambulatory Visit: Payer: Self-pay | Admitting: Obstetrics and Gynecology

## 2013-03-03 DIAGNOSIS — R928 Other abnormal and inconclusive findings on diagnostic imaging of breast: Secondary | ICD-10-CM

## 2013-03-05 ENCOUNTER — Ambulatory Visit
Admission: RE | Admit: 2013-03-05 | Discharge: 2013-03-05 | Disposition: A | Discharge status: Home / Self Care | Payer: BC Managed Care – PPO | Source: Ambulatory Visit | Attending: Radiation Oncology | Admitting: Radiation Oncology

## 2013-03-05 ENCOUNTER — Encounter: Payer: Self-pay | Admitting: Oncology

## 2013-03-05 ENCOUNTER — Ambulatory Visit (HOSPITAL_BASED_OUTPATIENT_CLINIC_OR_DEPARTMENT_OTHER): Payer: Medicare PPO | Admitting: Oncology

## 2013-03-05 ENCOUNTER — Ambulatory Visit (HOSPITAL_BASED_OUTPATIENT_CLINIC_OR_DEPARTMENT_OTHER): Payer: BC Managed Care – PPO

## 2013-03-05 ENCOUNTER — Encounter: Payer: Self-pay | Admitting: *Deleted

## 2013-03-05 ENCOUNTER — Ambulatory Visit (INDEPENDENT_AMBULATORY_CARE_PROVIDER_SITE_OTHER): Payer: Medicare PPO | Admitting: General Surgery

## 2013-03-05 ENCOUNTER — Ambulatory Visit: Payer: Medicare PPO | Attending: General Surgery | Admitting: Physical Therapy

## 2013-03-05 ENCOUNTER — Encounter (INDEPENDENT_AMBULATORY_CARE_PROVIDER_SITE_OTHER): Payer: Self-pay | Admitting: General Surgery

## 2013-03-05 ENCOUNTER — Ambulatory Visit (HOSPITAL_BASED_OUTPATIENT_CLINIC_OR_DEPARTMENT_OTHER): Payer: Medicare PPO | Admitting: General Surgery

## 2013-03-05 ENCOUNTER — Other Ambulatory Visit (HOSPITAL_BASED_OUTPATIENT_CLINIC_OR_DEPARTMENT_OTHER): Payer: Medicare PPO

## 2013-03-05 ENCOUNTER — Encounter: Payer: BC Managed Care – PPO | Admitting: Oncology

## 2013-03-05 ENCOUNTER — Telehealth: Payer: Self-pay | Admitting: *Deleted

## 2013-03-05 VITALS — BP 161/76 | HR 78 | Temp 99.0°F | Resp 19

## 2013-03-05 DIAGNOSIS — C50419 Malignant neoplasm of upper-outer quadrant of unspecified female breast: Secondary | ICD-10-CM

## 2013-03-05 DIAGNOSIS — D0590 Unspecified type of carcinoma in situ of unspecified breast: Secondary | ICD-10-CM | POA: Insufficient documentation

## 2013-03-05 DIAGNOSIS — C50411 Malignant neoplasm of upper-outer quadrant of right female breast: Secondary | ICD-10-CM

## 2013-03-05 DIAGNOSIS — R293 Abnormal posture: Secondary | ICD-10-CM | POA: Insufficient documentation

## 2013-03-05 DIAGNOSIS — Z171 Estrogen receptor negative status [ER-]: Secondary | ICD-10-CM

## 2013-03-05 DIAGNOSIS — I10 Essential (primary) hypertension: Secondary | ICD-10-CM | POA: Insufficient documentation

## 2013-03-05 DIAGNOSIS — IMO0001 Reserved for inherently not codable concepts without codable children: Secondary | ICD-10-CM | POA: Insufficient documentation

## 2013-03-05 LAB — COMPREHENSIVE METABOLIC PANEL (CC13)
ALBUMIN: 3.8 g/dL (ref 3.5–5.0)
ALK PHOS: 65 U/L (ref 40–150)
ALT: 17 U/L (ref 0–55)
AST: 18 U/L (ref 5–34)
Anion Gap: 9 mEq/L (ref 3–11)
BUN: 8.6 mg/dL (ref 7.0–26.0)
CHLORIDE: 103 meq/L (ref 98–109)
CO2: 28 mEq/L (ref 22–29)
Calcium: 9.3 mg/dL (ref 8.4–10.4)
Creatinine: 0.8 mg/dL (ref 0.6–1.1)
Glucose: 108 mg/dl (ref 70–140)
Potassium: 3.1 mEq/L — ABNORMAL LOW (ref 3.5–5.1)
SODIUM: 141 meq/L (ref 136–145)
Total Bilirubin: 0.51 mg/dL (ref 0.20–1.20)
Total Protein: 7.4 g/dL (ref 6.4–8.3)

## 2013-03-05 LAB — CBC WITH DIFFERENTIAL/PLATELET
BASO%: 0.3 % (ref 0.0–2.0)
BASOS ABS: 0 10*3/uL (ref 0.0–0.1)
EOS%: 1.7 % (ref 0.0–7.0)
Eosinophils Absolute: 0.1 10*3/uL (ref 0.0–0.5)
HCT: 38.4 % (ref 34.8–46.6)
HEMOGLOBIN: 12.3 g/dL (ref 11.6–15.9)
LYMPH#: 1.9 10*3/uL (ref 0.9–3.3)
LYMPH%: 23.3 % (ref 14.0–49.7)
MCH: 28.1 pg (ref 25.1–34.0)
MCHC: 32.2 g/dL (ref 31.5–36.0)
MCV: 87.2 fL (ref 79.5–101.0)
MONO#: 0.4 10*3/uL (ref 0.1–0.9)
MONO%: 4.4 % (ref 0.0–14.0)
NEUT%: 70.3 % (ref 38.4–76.8)
NEUTROS ABS: 5.8 10*3/uL (ref 1.5–6.5)
Platelets: 279 10*3/uL (ref 145–400)
RBC: 4.4 10*6/uL (ref 3.70–5.45)
RDW: 13.9 % (ref 11.2–14.5)
WBC: 8.2 10*3/uL (ref 3.9–10.3)

## 2013-03-05 NOTE — Progress Notes (Signed)
Checked in new patient with no financial issues. She has appt card and I have her [facility] packet.

## 2013-03-05 NOTE — Progress Notes (Signed)
[facility] NEW PATIENT EVALUATION  Name: [name] MRN: [id number]  Date:   03/05/2013           DOB: [birth year]  Status: outpatient   CC: Asencion Noble, MD  Edward Jolly, MD    REFERRING PHYSICIAN: Excell Seltzer Darene Lamer, MD   DIAGNOSIS:  Stage 0 (Tis N0 M0) of the right breast  HISTORY OF PRESENT ILLNESS:  [name] is a 63 y.o. female who is seen today at the [facility] through the courtesy of Dr. 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 She noted a palpable mass along the upper-outer quadrant of the right breast. Mammography on 02/19/2013 at [facility] revealed a suspicious mass at 10:00, 6 cm from the right nipple. An ultrasound showed a mass measuring 3.1 x 2.0 x 1.2 cm. There were no abnormal lymph nodes within the axilla. An ultrasound-guided biopsy on 02/19/2013 was diagnostic for high-grade DCIS with comedonecrosis. This was ER/PR negative. Breast MR on 08/27/2013 showed a 3 x 2.4 x 2 cm irregular mass at 10:00 and also non mass like like enhancement anterior and inferior to the mass at 10:00 measuring 7 x 5 x 3 cm. A biopsy was recommended and is scheduled for February 10. She is without complaints today. She seen today with Dr. Marlou Starks and Dr. Humphrey Rolls.  PREVIOUS RADIATION THERAPY: No   PAST MEDICAL HISTORY:  has a past medical history of Hypercholesteremia; Hypothyroid; Hypertension; Menopausal symptoms; Vertigo (05/13/2012); [id number]) (05/13/2012); Neck pain (05/13/2012); and Hot flashes.     PAST SURGICAL HISTORY:  Past Surgical History  Procedure Laterality Date  . Thyroid surgery    . Cholecystectomy    . Abdominal hysterectomy    . Esophagogastroduodenoscopy  09/17/2008    RMR:Two mid esophageal diverticula/Small benign cystic mucosal lesions distal esophagus of doubtful  clinical significance, stable for least 5 years/ Small hiatal hernia, otherwise normal stomach D1, D2  . Colonoscopy  09/17/2008    [id number]  tortuous, but otherwise normal-appearing colon/. Scattered diverticula  . Esophagogastroduodenoscopy    02/10/2003    [id number] esophageal 3 cystic lesions without luminal compromise or evidence/Nonerosive antral gastritis/The esophagus was dilated by passing 56 [place] Maloney dilator.Marland Kitchen Epic notes states +H.pylori gastritis. treatment completed per epic notes.   . Eus  Aug 2011    Dr. Newman Pies: EGD with multiple lower esophageal submucosal nodules, stomach and duodenum normal, EUS with esophageal duplication cysts  . Esophagogastroduodenoscopy (egd) with esophageal dilation N/A 02/03/2013    Procedure: ESOPHAGOGASTRODUODENOSCOPY (EGD) WITH ESOPHAGEAL DILATION;  Surgeon: Daneil Dolin, MD;  Location: [facility];  Service: Endoscopy;  Laterality: N/A;  8:45     FAMILY HISTORY: family history includes Cancer in her father; Diabetes in her brother and brother; Hypertension in her brother and brother; Lupus in her sister; Multiple sclerosis in her mother. There is no history of Colon cancer.   SOCIAL HISTORY:  reports that she has never smoked. She has never used smokeless tobacco. She reports that she does not drink alcohol or use illicit drugs. Married.   ALLERGIES: Codeine and Percocet   MEDICATIONS:  Current Outpatient Prescriptions  Medication Sig Dispense Refill  . aspirin EC 81 MG tablet Take 81 mg by mouth daily.      . fluticasone (FLONASE) 50 MCG/ACT nasal spray Place 50 sprays into the nose daily as needed for allergies.       . hydrochlorothiazide (HYDRODIURIL) 25 MG tablet Take 25 mg by mouth  daily.        . levothyroxine (SYNTHROID, LEVOTHROID) 88 MCG tablet Take 88 mcg by mouth daily.        . Linaclotide (LINZESS) 145 MCG CAPS capsule Take 1 capsule (145 mcg total) by mouth daily. 30 minutes before breakfast  30 capsule  3  . methocarbamol (ROBAXIN) 500 MG tablet Take 500 mg by mouth 4 (four) times daily.      . multivitamin (PROSIGHT) TABS Take 1 tablet by mouth daily.       . pantoprazole (PROTONIX) 40 MG tablet Take 1 tablet (40 mg total) by mouth daily. Take 30 minutes before first meal of the day  30 tablet  3  . rizatriptan (MAXALT-MLT) 10 MG disintegrating tablet Take 1 tablet (10 mg total) by mouth 3 (three) times daily as needed for migraine. May repeat in 2 hours if needed  12 tablet  3  . rosuvastatin (CRESTOR) 5 MG tablet Take 5 mg by mouth daily.        Marland Kitchen zolpidem (AMBIEN) 10 MG tablet Take 10 mg by mouth at bedtime as needed for sleep.        No current facility-administered medications for this encounter.     REVIEW OF SYSTEMS:  Pertinent items are noted in HPI.    PHYSICAL EXAM: Alert and oriented 63 year old African American female appearing her stated age. Wt Readings from Last 3 Encounters:  03/05/13 165 lb 6.4 oz (75.025 kg)  02/03/13 164 lb (74.39 kg)  02/03/13 164 lb (74.39 kg)   Temp Readings from Last 3 Encounters:  03/05/13 99 F (37.2 C) Oral  03/05/13 98.9 F (37.2 C) Oral  02/19/13 98.1 F (36.7 C) Oral   BP Readings from Last 3 Encounters:  03/05/13 161/76  03/05/13 151/79  02/19/13 113/64   Pulse Readings from Last 3 Encounters:  03/05/13 78  03/05/13 69  02/19/13 68    Head and neck examination: Grossly unremarkable. Nodes: Without palpable cervical, supraclavicular, or axillary lymphadenopathy. Chest: Lungs clear. Back: Without spinal or CVA tenderness. Breasts: There is a palpable mass with overlying ecchymosis at 10:00 along the upper-outer quadrant of the right breast measuring approximately 23 cm. Left breast without masses or lesions. Abdomen: Without hepatomegaly. Extremities: Without edema.   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 She will undergo MRI guided right breast biopsy of the suspicious involvement anterior and inferior to the index lesion at 10:00. This is suspicious for DCIS. If this biopsy is positive then she would require a right mastectomy. She should also be considered for a sentinel lymph node biopsy in view of the extent of her disease and also high nuclear grade with comedonecrosis. If this biopsy is negative, then she may be a candidate for breast preservation. We discussed the potential acute and late toxicities of radiation therapy. She is interested in breast preservation if possible.   PLAN: As discussed above.  I spent 30 minutes minutes face to face with the patient and more than 50% of that time was spent in counseling and/or coordination of care.

## 2013-03-05 NOTE — Progress Notes (Signed)
[name] [id number] Feb 08, 1950 63 y.o. 03/05/2013 3:41 PM  CC Dr. Autumn Messing Dr. Erin Sons, MD 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 Patient is seen in the multidisciplinary breast clinic for discussion of treatment options  STAGE:   Breast cancer of upper-outer quadrant of right female breast   Primary site: Breast (Right)   Staging method: AJCC 7th Edition   Clinical: Stage 0 (Tis (DCIS), N0, cM0)   Summary: Stage 0 (Tis (DCIS), N0, cM0)  REFERRING PHYSICIAN: Dr. Autumn Messing  HISTORY OF PRESENT ILLNESS:  [name] is a 63 y.o. female.  Who palpated a mass in the right breast measured 3.1 cm by ultrasound at the 10:00 position. MRI revealed a 3.0 cm area of concern. There was also a second lesion noted that was 9 masslike enhancement anterior inferior to the biopsied lesion. This is to be scheduled for a biopsy on 03/11/2013. The pathology from the original biopsy revealed a high-grade DCIS with comedonecrosis. Measuring 0.7 cm from the glass slide. The tumor was ER negative PR negative. Patient's case was discussed at the multidisciplinary breast conference. She is now seen in the multidisciplinary breast clinic for discussion of treatment options. She is accompanied by her husband. She was also seen by Dr. Autumn Messing and Dr. Arloa Koh. She does have some tenderness in the right breast otherwise she has no other complaints.   Past Medical History: Past Medical History  Diagnosis Date  . Hypercholesteremia   . Hypothyroid   . Hypertension   . Menopausal symptoms   . Vertigo 05/13/2012  . Headache(784.0) 05/13/2012  . Neck pain 05/13/2012  . Hot flashes   . Breast cancer     Past Surgical History: Past Surgical History  Procedure Laterality Date  . Thyroid surgery    . Cholecystectomy     . Abdominal hysterectomy    . Esophagogastroduodenoscopy  09/17/2008    RMR:Two mid esophageal diverticula/Small benign cystic mucosal lesions distal esophagus of doubtful  clinical significance, stable for least 5 years/ Small hiatal hernia, otherwise normal stomach D1, D2  . Colonoscopy  09/17/2008    VOH:YWVP tortuous, but otherwise normal-appearing colon/. Scattered diverticula  . Esophagogastroduodenoscopy    02/10/2003    XTG:GYIRSW esophageal 3 cystic lesions without luminal compromise or evidence/Nonerosive antral gastritis/The esophagus was dilated by passing 56 [place] Maloney dilator.Marland Kitchen Epic notes states +H.pylori gastritis. treatment completed per epic notes.   . Eus  Aug 2011    Dr. Newman Pies: EGD with multiple lower esophageal submucosal nodules, stomach and duodenum normal, EUS with esophageal duplication cysts  . Esophagogastroduodenoscopy (egd) with esophageal dilation N/A 02/03/2013    Procedure: ESOPHAGOGASTRODUODENOSCOPY (EGD) WITH ESOPHAGEAL DILATION;  Surgeon: Daneil Dolin, MD;  Location: [facility];  Service: Endoscopy;  Laterality: N/A;  8:45    Family History: Family History  Problem Relation Age of Onset  . Lupus Sister   . Hypertension Brother   . Diabetes Brother   . Hypertension Brother   . Diabetes Brother   . Multiple sclerosis Mother   . Cancer Father   . Colon cancer Neg Hx     Social History History  Substance Use Topics  . Smoking status: Never Smoker   . Smokeless tobacco: Never Used  . Alcohol Use: No    Allergies: Allergies  Allergen Reactions  . Codeine Nausea And  Vomiting  . Percocet [Oxycodone-Acetaminophen] Rash    Current Medications: Current Outpatient Prescriptions  Medication Sig Dispense Refill  . aspirin EC 81 MG tablet Take 81 mg by mouth daily.      . fluticasone (FLONASE) 50 MCG/ACT nasal spray Place 50 sprays into the nose daily as needed for allergies.       . hydrochlorothiazide (HYDRODIURIL) 25 MG tablet Take 25 mg  by mouth daily.        Marland Kitchen levothyroxine (SYNTHROID, LEVOTHROID) 88 MCG tablet Take 88 mcg by mouth daily.        . Linaclotide (LINZESS) 145 MCG CAPS capsule Take 1 capsule (145 mcg total) by mouth daily. 30 minutes before breakfast  30 capsule  3  . methocarbamol (ROBAXIN) 500 MG tablet Take 500 mg by mouth 4 (four) times daily.      . multivitamin (PROSIGHT) TABS Take 1 tablet by mouth daily.      . pantoprazole (PROTONIX) 40 MG tablet Take 1 tablet (40 mg total) by mouth daily. Take 30 minutes before first meal of the day  30 tablet  3  . rizatriptan (MAXALT-MLT) 10 MG disintegrating tablet Take 1 tablet (10 mg total) by mouth 3 (three) times daily as needed for migraine. May repeat in 2 hours if needed  12 tablet  3  . rosuvastatin (CRESTOR) 5 MG tablet Take 5 mg by mouth daily.        Marland Kitchen zolpidem (AMBIEN) 10 MG tablet Take 10 mg by mouth at bedtime as needed for sleep.        No current facility-administered medications for this visit.    OB/GYN History: Menarche at age 20 she underwent menopause in 31 and her hysterectomy. She said 3 live births first live birth 35. No hormone replacement therapy  Fertility Discussion: Not applicable Prior History of Cancer: No  Health Maintenance:  Colonoscopy August 2011 Bone Density July 2013 and will have it again Feb 2015 Last PAP smear July 2000 410  ECOG PERFORMANCE STATUS: 0 - Asymptomatic  Genetic Counseling/testing: n/a  REVIEW OF SYSTEMS:  A complete 14 point review of systems was obtained and scan separately into the electronic medical record  PHYSICAL EXAMINATION: Blood pressure 161/76, pulse 78, temperature 99 F (37.2 C), temperature source Oral, resp. rate 19.  General:  well-nourished in no acute distress.  Eyes:  no scleral icterus.  ENT:  There were no oropharyngeal lesions.  Neck was without thyromegaly.  Lymphatics:  Negative cervical, supraclavicular or axillary adenopathy.  Respiratory: lungs were clear bilaterally  without wheezing or crackles.  Cardiovascular:  Regular rate and rhythm, S1/S2, without murmur, rub or gallop.  There was no pedal edema.  GI:  abdomen was soft, flat, nontender, nondistended, without organomegaly.  Muscoloskeletal:  no spinal tenderness of palpation of vertebral spine.  Skin exam was without echymosis, petichae.  Neuro exam was nonfocal.  Patient was able to get on and off exam table without assistance.  Gait was normal.  Patient was alerted and oriented.  Attention was good.   Language was appropriate.  Mood was normal without depression.  Speech was not pressured.  Thought content was not tangential.   Breasts: breasts appear normal, no suspicious masses, no skin or nipple changes or axillary nodes, left breast normal without mass, skin or nipple changes or axillary nodes, abnormal mass palpable in the upper outer quadrant with area of ecchymosis in the right breast. 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  EXAM: BILATERAL BREAST MRI WITH AND WITHOUT CONTRAST  LABS:  BUN and creatinine were obtained on site at [place] at  [address]  Results:  BUN 7 mg/dL,  Creatinine 0.8 mg/dL.  TECHNIQUE: Multiplanar, multisequence MR images of both breasts were obtained prior to and following the intravenous administration of 46m of MultiHance.  THREE-DIMENSIONAL MR IMAGE RENDERING ON INDEPENDENT WORKSTATION:  Three-dimensional MR images were rendered by post-processing of the original MR data on an independent workstation. The three-dimensional MR images were interpreted, and findings are reported in the following complete MRI report for this study. Three dimensional images were evaluated at the independent DynaCad workstation  COMPARISON:  Previous exams  FINDINGS: Breast composition: b.  Scattered  fibroglandular tissue.  Background parenchymal enhancement: Moderate  Right breast: At 10 o'clock in the right breast, there is an irregular mass measuring 3 x 2.4 x 2 cm. This corresponds with the biopsy-proven ductal carcinoma in situ. Susceptibility artifact is seen at the lateral margin of the mass, consistent with the biopsy site marker. Immediately anterior and inferior to this mass is a segmental area of non mass enhancement in the lower outer and, to a lesser extent, slightly upper outer breast. In total, the non mass enhancement measures 7 cm AP x 5 cm TR x 3 cm CC. Incidentally noted in the far medial slightly upper right breast is a 0.8 cm T2 hyperintense lesion abutting the skin with minimal thin smooth rim enhancement. This corresponds with a previously evaluated sebaceous cyst.  Left breast: No mass or abnormal enhancement.  Lymph nodes: No abnormal appearing lymph nodes.  Ancillary findings:  None.  IMPRESSION: 1. A 3 x 2.4 x 2 cm irregular mass at 10 o'clock in the right breast, corresponding with the biopsy-proven ductal carcinoma in situ. 2. Anterior and inferior to the mass at 10 o'clock in the right breast is a segmental 7 x 5 x 3 cm area of non mass enhancement, concerning for additional disease. 3. No suspicious findings in the left breast.  RECOMMENDATION: If breast conservation is being considered, an MRI guided biopsy of the non mass enhancement in the right breast is recommended to document extent of disease.  BI-RADS CATEGORY  4: Suspicious abnormality - biopsy should be considered.   Electronically Signed   By: MDonavan BurnetM.D.   On: 02/27/2013 11:33   Mm Digital Diagnostic Bilat  02/19/2013   CLINICAL DATA:  Patient feels a mass in the right upper outer quadrant.  EXAM: DIGITAL DIAGNOSTIC  BILATERAL MAMMOGRAM WITH CAD  ULTRASOUND RIGHT BREAST  COMPARISON:  02/12/2012, 01/26/2011, 01/20/2010, 01/18/2009  ACR Breast Density Category b: There are scattered areas of fibroglandular  density.  FINDINGS: There is a vague irregular focal asymmetry in the right upper outer quadrant which corresponds to the palpable abnormality. No other abnormalities noted in either breast.  Mammographic images were processed with CAD.  On physical exam, there is a firm palpable mass in the right upper outer quadrant at 10 o'clock 6 cm from the right nipple.  Ultrasound is performed, showing an irregular mass at 10 o'clock 6 cm from the right nipple measuring 3.1 x 2.0 x 1.2 cm. No abnormal right axillary lymph nodes are identified.  The appearance is suspicious for invasive mammary carcinoma, possibly lobular carcinoma. Biopsy is suggested. Ultrasound-guided core  needle biopsy was discussed with the patient and she agreed with this plan.  IMPRESSION: Suspicious irregular mass at 10 o'clock 6 cm from the right nipple.  RECOMMENDATION: Ultrasound-guided core needle biopsy is suggested. This will be performed and reported separately.  I have discussed the findings and recommendations with the patient. Results were also provided in writing at the conclusion of the visit.  BI-RADS CATEGORY  4: Suspicious abnormality - biopsy should be considered.   Electronically Signed   By: Ulyess Blossom M.D.   On: 02/19/2013 17:04   US Breast Ltd Uni Right Inc Axilla  02/19/2013   CLINICAL DATA:  Patient feels a mass in the right upper outer quadrant.  EXAM: DIGITAL DIAGNOSTIC  BILATERAL MAMMOGRAM WITH CAD  ULTRASOUND RIGHT BREAST  COMPARISON:  02/12/2012, 01/26/2011, 01/20/2010, 01/18/2009  ACR Breast Density Category b: There are scattered areas of fibroglandular density.  FINDINGS: There is a vague irregular focal asymmetry in the right upper outer quadrant which corresponds to the palpable abnormality. No other abnormalities noted in either breast.  Mammographic images were processed with CAD.  On physical exam, there is a firm palpable mass in the right upper outer quadrant at 10 o'clock 6 cm from the right nipple.   Ultrasound is performed, showing an irregular mass at 10 o'clock 6 cm from the right nipple measuring 3.1 x 2.0 x 1.2 cm. No abnormal right axillary lymph nodes are identified.  The appearance is suspicious for invasive mammary carcinoma, possibly lobular carcinoma. Biopsy is suggested. Ultrasound-guided core needle biopsy was discussed with the patient and she agreed with this plan.  IMPRESSION: Suspicious irregular mass at 10 o'clock 6 cm from the right nipple.  RECOMMENDATION: Ultrasound-guided core needle biopsy is suggested. This will be performed and reported separately.  I have discussed the findings and recommendations with the patient. Results were also provided in writing at the conclusion of the visit.  BI-RADS CATEGORY  4: Suspicious abnormality - biopsy should be considered.   Electronically Signed   By: Ulyess Blossom M.D.   On: 02/19/2013 17:04   [place] Rt Breast Bx W Loc Dev 1st Lesion Img Bx Spec US Guide  02/21/2013   ADDENDUM REPORT: 02/21/2013 11:11  ADDENDUM: PATHOLOGY ADDENDUM:  Pathology right breast:  Diagnosis  Breast, right, needle core biopsy, 10 o'clock 6cm/nipple  - HIGH GRADE DUCTAL CARCINOMA IN SITU WITH ASSOCIATED COMEDO NECROSIS.  Pathology concordance with imaging findings: Yes  Recommendation: Consultation regarding treatment plan. The requested an appointment with the Multidisciplinary Clinic in [place]. An appointment has been made for the patient on March 05, 2013. MRI will be arranged, per protocol.  At the request of the patient, I spoke with her by telephone on 02/21/2013 at 11:10. She reports doing well after the biopsy .   Electronically Signed   By: Shon Hale M.D.   On: 02/21/2013 11:11   02/21/2013   CLINICAL DATA:  Irregular mass at 10 o'clock 6 cm from the right nipple.  EXAM: ULTRASOUND GUIDED RIGHT BREAST CORE NEEDLE BIOPSY WITH VACUUM ASSIST  COMPARISON:  Previous exams.  PROCEDURE: I met with the patient and we discussed the procedure of ultrasound-guided  biopsy, including benefits and alternatives. We discussed the high likelihood of a successful procedure. We discussed the risks of the procedure including infection, bleeding, tissue injury, clip migration, and inadequate sampling. Informed written consent was given. The usual time-out protocol was performed immediately prior to the procedure.  Using sterile technique, 2% Lidocaine as local anesthetic, and  direct ultrasound visualization, a 12 gauge vacuum-assisteddevice was used to perform biopsy of the mass at 10 o'clock 6 cm from the right nipple using a caudocranial approach. At the conclusion of the procedure, a ribbon tissue marker clip was deployed into the biopsy cavity. Follow-up 2-view mammogram was performed demonstrating appropriate placement of the ribbon clip within the area of abnormality.  IMPRESSION: Ultrasound-guided biopsy of a mass at 10 o'clock 6 cm from the right nipple find report. No apparent complications.  Electronically Signed: By: Ulyess Blossom M.D. On: 02/19/2013 17:07     LABS:    Chemistry      Component Value Date/Time   NA 141 03/05/2013 0818   NA 137 11/20/2012 1609   K 3.1* 03/05/2013 0818   K 3.4* 11/20/2012 1609   CL 98 11/20/2012 1609   CO2 28 03/05/2013 0818   CO2 29 11/20/2012 1609   BUN 8.6 03/05/2013 0818   BUN 14 11/20/2012 1609   CREATININE 0.8 03/05/2013 0818   CREATININE 0.87 11/20/2012 1609      Component Value Date/Time   CALCIUM 9.3 03/05/2013 0818   CALCIUM 9.4 11/20/2012 1609   ALKPHOS 65 03/05/2013 0818   ALKPHOS 65 11/20/2012 1609   AST 18 03/05/2013 0818   AST 21 11/20/2012 1609   ALT 17 03/05/2013 0818   ALT 15 11/20/2012 1609   BILITOT 0.51 03/05/2013 0818   BILITOT 0.4 11/20/2012 1609      Lab Results  Component Value Date   WBC 8.2 03/05/2013   HGB 12.3 03/05/2013   HCT 38.4 03/05/2013   MCV 87.2 03/05/2013   PLT 279 03/05/2013   PATHOLOGY 02/19/13  PROGNOSTIC INDICATORS - ACIS Results: IMMUNOHISTOCHEMICAL AND MORPHOMETRIC ANALYSIS BY THE  AUTOMATED CELLULAR IMAGING SYSTEM (ACIS) Estrogen Receptor: 0%, NEGATIVE Progesterone Receptor: 0%, NEGATIVE COMMENT: The negative hormone receptor study(ies) in this case have an internal positive control. REFERENCE RANGE ESTROGEN RECEPTOR NEGATIVE <1% POSITIVE =>1% PROGESTERONE RECEPTOR NEGATIVE <1% POSITIVE =>1% All controls stained appropriately Claudette Laws MD Pathologist, Electronic Signature ( Signed 02/27/2013) FINAL DIAGNOSIS Diagnosis Breast, right, needle core biopsy, 10 o'clock 6cm/nipple - HIGH GRADE DUCTAL CARCINOMA IN SITU WITH ASSOCIATED COMEDO NECROSIS. PLEASE SEE COMMENT. 1 of 2 FINAL for DARLENE, BROZOWSKI ([id number]) Microscopic Comment There is a high grade ductal carcinoma with associated comedo necrosis that measures 0.7 cm from the glass slide. ER and PR will be performed and an addendum report will follow. Dr. Gari Crown agrees.   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 Biopsy performed on 02/19/2013 revealed DCIS that was ER negative PR negative. On the MRI she was also noted to have a second lesion in noted which was non-masslike enhancement. This does need to be biopsied and a biopsy scheduled for February 10. The results of this biopsy would determine whether the patient would be a breast conservation candidate versus a mastectomy.  #2 We spent the better part of today's hour-long appointment discussing the biology of breast cancer in general, and the specifics of the patient's tumor in particular. Patient and I discussed her pathology in detail as well as her radiology. We discussed the diagnosis of noninvasive/DCIS breast cancer treatment options including lumpectomy versus a mastectomy. We discussed  adjuvant/chemoprevention with antiestrogen therapy. We discussed radiation  therapy in case of lumpectomy. Patient's tumor is ER negative so therefore she would not be a candidate for ipsilateral breast cancer prevention however we did discuss use of tamoxifen for chemoprevention for future breast cancer prevention for ER-positive breast cancers. We also discussed risks benefits and side effects of antiestrogen therapy.  #3 patient will proceed with biopsy of the second mass found on MRI. Depending on the results she would either proceed with a lumpectomy or a mastectomy.  #4 I will plan on seeing the patient after her surgery.    Discussion: Patient is being treated per NCCN breast cancer care guidelines appropriate for stage.0   Thank you so much for allowing me to participate in the care of [place]. I will continue to follow up the patient with you and assist in her care.  All questions were answered. The patient knows to call the clinic with any problems, questions or concerns. We can certainly see the patient much sooner if necessary.  I spent 30 minutes counseling the patient face to face. The total time spent in the appointment was 60 minutes.  Marcy Panning, MD Medical/Oncology [facility] [phone] (beeper) [phone] (Office)  03/05/2013, 3:41 PM

## 2013-03-05 NOTE — Progress Notes (Signed)
[place] Psychosocial Distress Screening Clinical Social Work  [place] representative Intern met Patient at breast clinic to assess psychosocial needs and concerns. The patient scored a 5 on the Psychosocial Distress Thermometer which indicates moderate distress. Patient appears to have adequate knowledge of her treatment plan.  CSWI reviewed with Patient, resources/programs at [facility] to assist.  CSWI provided written information and Patient is aware to reach out to [facility] as needed.   Clinical Social Worker follow up needed: no  If yes, follow up plan:   Teo Moede S. [place] Work Intern [employer] [phone]

## 2013-03-05 NOTE — Telephone Encounter (Signed)
Faxed [facility] form to Connie.  Completed ROI and took to Med Rec to scan.  

## 2013-03-05 NOTE — Progress Notes (Signed)
Pt came in for the AM [facility] and when the nurse Amy M. Entered the room the pts husband informed her that they wanted to see Dr. Humphrey Rolls.  They did not inform us of this request until this am.  Amy M brought this information to my attention and I went to [facility] to make her aware.  She informed me to get the ok from both Drs. Magrinat and Humphrey Rolls and if the pt was fine with coming back this afternoon then she could be seen in the PM clinic.  I went to both Drs. Magrinat and Humphrey Rolls and they were ok with it.  Went to speak with the patient and her husband and the pt's husband is a current pt of Dr. Laurelyn Sickle and that is the reason for the request.  They are fine with coming back this afternoon for clinic.  I went to [place] to verify what all I needed to do.  She informed me that I did not need to do anything for the [facility] and the lab appts because we could use those for Dr. Humphrey Rolls as well.  I cancelled Dr. Virgie Dad check in and then cancelled the appt.  I went to schedule an appt for Dr. Humphrey Rolls and was unable because of my access, so I went and got Dawn to enter it for me.  I then called Santiago Glad in [place] to make her aware of this switch and she cancelled check in and changed appt time for Dr. Valere Dross.  I called Marlowe Kays at [facility] to make her aware of this and she will cancel check in, cancel appt w/ Dr. Excell Seltzer and schedule an appt for Dr. Marlou Starks for the PM clinic.  I called Leigh at [facility] to make her aware of the doctors change.  I went to lab and CL changed the physician from Dr. Jana Hakim to Dr. Humphrey Rolls in [place] to registration to make the ladies aware that the pt is coming in the afternoon and she is not required to do any of the extras since she completed it this am.  Made Nurse Techs aware. Made the [place] aware.  Changed all paperwork & charts for the pt to be added for the PM Clinic.  Dawn made the AM [facility] team aware.

## 2013-03-05 NOTE — Progress Notes (Signed)
Patient ID: [name], female   DOB: Feb 17, 1950, 63 y.o.   MRN: [id number]  No chief complaint on file.   HPI [name] is a 63 y.o. female.  We're asked to see the patient in consultation by Dr. 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 The patient is a 63 year old black female who was scheduled for a routine screening mammogram. About a week before her study was supposed to be done she did feel a mass in the outer aspect of the right breast. She denies any breast pain. She denies any discharge or nipple. The mass measured 3 cm by ultrasound and MRI but also had an area of non-mass enhancement inferior to it. The total area covered about 7 cm.  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 The non-mass enhancement is scheduled to be biopsied next week.  HPI  Past Medical History  Diagnosis Date  . Hypercholesteremia   . Hypothyroid   . Hypertension   . Menopausal symptoms   . Vertigo 05/13/2012  . Headache(784.0) 05/13/2012  . Neck pain 05/13/2012  . Hot flashes     Past Surgical History  Procedure Laterality Date  . Thyroid surgery    . Cholecystectomy    . Abdominal hysterectomy    . Esophagogastroduodenoscopy  09/17/2008    RMR:Two mid esophageal diverticula/Small benign cystic mucosal lesions distal esophagus of doubtful  clinical significance, stable for least 5 years/ Small hiatal hernia, otherwise normal stomach D1, D2  . Colonoscopy  09/17/2008    PZW:CHEN tortuous, but otherwise normal-appearing colon/. Scattered diverticula  . Esophagogastroduodenoscopy    02/10/2003    IDP:OEUMPN esophageal 3 cystic lesions without luminal compromise or evidence/Nonerosive antral gastritis/The esophagus was dilated by passing 56 [place] Maloney dilator.Marland Kitchen Epic notes states +H.pylori gastritis. treatment completed per epic notes.   . Eus  Aug 2011    Dr. Newman Pies: EGD with multiple lower esophageal submucosal nodules, stomach and duodenum  normal, EUS with esophageal duplication cysts  . Esophagogastroduodenoscopy (egd) with esophageal dilation N/A 02/03/2013    Procedure: ESOPHAGOGASTRODUODENOSCOPY (EGD) WITH ESOPHAGEAL DILATION;  Surgeon: Daneil Dolin, MD;  [facility];  Service: Endoscopy;  Laterality: N/A;  8:45    Family History  Problem Relation Age of Onset  . Lupus Sister   . Hypertension Brother   . Diabetes Brother   . Hypertension Brother   . Diabetes Brother   . Multiple sclerosis Mother   . Cancer Father   . Colon cancer Neg Hx     Social History History  Substance Use Topics  . Smoking status: Never Smoker   . Smokeless tobacco: Never Used  . Alcohol Use: No    Allergies  Allergen Reactions  . Codeine Nausea And Vomiting  . Percocet [Oxycodone-Acetaminophen] Rash    Current Outpatient Prescriptions  Medication Sig Dispense Refill  . aspirin EC 81 MG tablet Take 81 mg by mouth daily.      . fluticasone (FLONASE) 50 MCG/ACT nasal spray Place 50 sprays into the nose daily as needed for allergies.       . hydrochlorothiazide (HYDRODIURIL) 25 MG tablet Take 25 mg by mouth daily.        Marland Kitchen levothyroxine (SYNTHROID, LEVOTHROID) 88 MCG tablet Take 88 mcg by mouth daily.        . Linaclotide (LINZESS) 145 MCG CAPS capsule Take 1 capsule (145 mcg total) by mouth daily. 30 minutes before breakfast  30  capsule  3  . methocarbamol (ROBAXIN) 500 MG tablet Take 500 mg by mouth 4 (four) times daily.      . multivitamin (PROSIGHT) TABS Take 1 tablet by mouth daily.      . pantoprazole (PROTONIX) 40 MG tablet Take 1 tablet (40 mg total) by mouth daily. Take 30 minutes before first meal of the day  30 tablet  3  . rizatriptan (MAXALT-MLT) 10 MG disintegrating tablet Take 1 tablet (10 mg total) by mouth 3 (three) times daily as needed for migraine. May repeat in 2 hours if needed  12 tablet  3  . rosuvastatin (CRESTOR) 5 MG tablet Take 5 mg by mouth daily.        Marland Kitchen zolpidem (AMBIEN) 10 MG tablet Take 10  mg by mouth at bedtime as needed for sleep.        No current facility-administered medications for this visit.    Review of Systems Review of Systems  Constitutional: Negative.   HENT: Negative.   Eyes: Negative.   Respiratory: Negative.   Cardiovascular: Negative.   Gastrointestinal: Negative.   Endocrine: Negative.   Genitourinary: Negative.   Musculoskeletal: Negative.   Skin: Negative.   Allergic/Immunologic: Negative.   Neurological: Negative.   Hematological: Negative.   Psychiatric/Behavioral: Negative.     There were no vitals taken for this visit.  Physical Exam Physical Exam  Constitutional: She is oriented to person, place, and time. She appears well-developed and well-nourished.  HENT:  Head: Normocephalic and atraumatic.  Eyes: Conjunctivae and EOM are normal. Pupils are equal, round, and reactive to light.  Neck: Normal range of motion. Neck supple.  Cardiovascular: Normal rate, regular rhythm and normal heart sounds.   Pulmonary/Chest: Effort normal and breath sounds normal.  There is a palpable fullness in the lateral aspect of the right breast that measures approximately 3 cm. It is mobile and not tethered to the skin of the chest wall. There is no palpable mass in the left breast. There is no palpable axillary, supraclavicular, or cervical lymphadenopathy  Abdominal: Soft. Bowel sounds are normal.  Musculoskeletal: Normal range of motion.  Lymphadenopathy:    She has no cervical adenopathy.  Neurological: She is alert and oriented to person, place, and time.  Skin: Skin is warm and dry.  Psychiatric: She has a normal mood and affect. Her behavior is normal.    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 She is scheduled to have another area of this biopsied next week. If this biopsy is negative then she may be a candidate for breast  conservation if she so chooses. If this biopsy is positive then I think her best option would be a mastectomy and sentinel node mapping. I discussed with her in detail the risk and benefits of these operations as well as some of the technical aspects and she understands.     Plan    Plan for biopsy of a second area in the right breast next week and then the patient will decide what definitive surgery she would like to have. If she chooses mastectomies she might also like to meet with the plastic surgeons to discuss the possibility of reconstruction        TOTH III,Ainsley Sanguinetti S 03/05/2013, 2:24 PM

## 2013-03-06 ENCOUNTER — Other Ambulatory Visit: Payer: Self-pay | Admitting: Oncology

## 2013-03-06 ENCOUNTER — Telehealth: Payer: Self-pay | Admitting: Oncology

## 2013-03-06 NOTE — Telephone Encounter (Signed)
, °

## 2013-03-07 ENCOUNTER — Ambulatory Visit
Admission: RE | Admit: 2013-03-07 | Discharge: 2013-03-07 | Disposition: A | Discharge status: Home / Self Care | Payer: Medicare PPO | Source: Ambulatory Visit | Attending: Obstetrics and Gynecology | Admitting: Obstetrics and Gynecology

## 2013-03-07 ENCOUNTER — Other Ambulatory Visit: Payer: Self-pay | Admitting: Obstetrics and Gynecology

## 2013-03-07 ENCOUNTER — Ambulatory Visit: Payer: BC Managed Care – PPO

## 2013-03-07 DIAGNOSIS — R928 Other abnormal and inconclusive findings on diagnostic imaging of breast: Secondary | ICD-10-CM

## 2013-03-07 MED ORDER — GADOBENATE DIMEGLUMINE 529 MG/ML IV SOLN
14.0000 mL | Freq: Once | INTRAVENOUS | Status: AC | PRN
  Administered 2013-03-07: 14 mL via INTRAVENOUS

## 2013-03-11 ENCOUNTER — Other Ambulatory Visit: Payer: BC Managed Care – PPO

## 2013-03-13 ENCOUNTER — Telehealth: Payer: Self-pay | Admitting: *Deleted

## 2013-03-13 ENCOUNTER — Telehealth (INDEPENDENT_AMBULATORY_CARE_PROVIDER_SITE_OTHER): Payer: Self-pay

## 2013-03-13 ENCOUNTER — Other Ambulatory Visit (INDEPENDENT_AMBULATORY_CARE_PROVIDER_SITE_OTHER): Payer: Self-pay | Admitting: General Surgery

## 2013-03-13 DIAGNOSIS — D0511 Intraductal carcinoma in situ of right breast: Secondary | ICD-10-CM

## 2013-03-13 NOTE — Telephone Encounter (Signed)
Message copied by Carlene Coria on Thu Mar 13, 2013  9:40 AM ------      Message from: Humphrey Rolls K      Created: Wed Mar 12, 2013 10:23 AM      Regarding: Dr Marlou Starks      Contact: [phone]       Want MRI results done on 03/07/13. Cell# [phone] ------

## 2013-03-13 NOTE — Telephone Encounter (Signed)
Called pt with MR results. She wises to proceed with lumpectomy. Advised we will get orders this afternoon and our schedulers will call her.

## 2013-03-13 NOTE — Telephone Encounter (Signed)
Called and spoke with patient from [facility] 03/05/13.  No questions or concerns at this time.  Encouraged patient to call with any needs.

## 2013-03-14 ENCOUNTER — Encounter (INDEPENDENT_AMBULATORY_CARE_PROVIDER_SITE_OTHER): Payer: Self-pay

## 2013-03-14 ENCOUNTER — Ambulatory Visit (INDEPENDENT_AMBULATORY_CARE_PROVIDER_SITE_OTHER): Payer: Medicare PPO | Admitting: Nurse Practitioner

## 2013-03-14 ENCOUNTER — Encounter: Payer: Self-pay | Admitting: Nurse Practitioner

## 2013-03-14 VITALS — BP 129/75 | HR 79 | Ht 65.5 in | Wt 166.0 lb

## 2013-03-14 DIAGNOSIS — R42 Dizziness and giddiness: Secondary | ICD-10-CM

## 2013-03-14 DIAGNOSIS — R51 Headache: Secondary | ICD-10-CM

## 2013-03-14 MED ORDER — GABAPENTIN 100 MG PO CAPS: 200.0000 mg | ORAL_CAPSULE | Freq: Every day | ORAL | Status: DC

## 2013-03-14 NOTE — Progress Notes (Signed)
I have read the note, and I agree with the clinical assessment and plan.  Yara Tomkinson KEITH   

## 2013-03-14 NOTE — Patient Instructions (Signed)
Try Gabapentin 200mg  po at hs  F/U in 6 months

## 2013-03-14 NOTE — Progress Notes (Signed)
[facility]  PATIENT: [name] DOB: March 23, 1950   REASON FOR VISIT: follow up for vertigo and headche   HISTORY OF PRESENT ILLNESS: [name], 63 year old female returns for followup she was last seen in the office by Dr. Jannifer Franklin 10/08/2012 and is a previous patient of Dr. Erling Cruz. She continues to have intermittent headaches, and Maxalt was not  very beneficial for her. She had been on gabapentin from the pain center however she ran out of her prescription and she no longer sees Dr. Ace Gins. She felt gabapentin works better for her headaches than anything that has been tried. She has recent diagnosis of high grade ductal carcinoma right breast and is due to have surgery next month. She has been told recently that she has arthritis by Dr. Willey Blade .She has not been having recent episodes of vertigo She returns for followup and reevaluation   HISTORY: of episodes of vertigo and episodes of headache. The patient does not relate the headache and the vertigo together. The patient indicates that she may have 2 or 3 headaches a month, and the headaches may last 2 or 3 hours. The patient will take Advil or other over-the-counter medications for her headache. The patient indicates that the vertigo is less common, but she indicates that chocolate will bring on vertigo. The patient also has some discomfort on her right arm, and right lower extremity. The patient has some left leg pain as well. The patient is followed by Dr. Ace Gins from the pain center for this pain issue. MRI evaluation of the cervical spine shows minimal disc bulges at the C5-6 and C6-7 levels. The patient indicates that she has undergone EMG and nerve conduction study evaluation previously, and this was unremarkable. The patient reports low back pain. The patient has chronic insomnia, and she has difficulty sleeping on her right side because of pain. The patient indicates that the right side of her neck is uncomfortable, and  she has an itching sensation on the back of the head on the right. The patient has refused injections through the pain center. The patient returns for an evaluation. The patient notes that if she takes Ambien at night for sleep, she may have some dizziness the next morning when she first wakes up.  REVIEW OF SYSTEMS: Full 14 system review of systems performed and notable only for those listed, all others are neg:  Constitutional: N/A  Cardiovascular: N/A  Ear/Nose/Throat: neck pain Skin: N/A  Eyes: N/A  Respiratory: N/A  Gastroitestinal: Constipation  Hematology/Lymphatic: N/A  Endocrine: N/A Musculoskeletal: Joint pain Allergy/Immunology: N/A  Neurological: Occasional headache  Psychiatric: Occasional depression  ALLERGIES: Allergies  Allergen Reactions  . Codeine Nausea And Vomiting  . Percocet [Oxycodone-Acetaminophen] Rash    HOME MEDICATIONS: Outpatient Prescriptions Prior to Visit  Medication Sig Dispense Refill  . aspirin EC 81 MG tablet Take 81 mg by mouth daily.      . fluticasone (FLONASE) 50 MCG/ACT nasal spray Place 50 sprays into the nose daily as needed for allergies.       . hydrochlorothiazide (HYDRODIURIL) 25 MG tablet Take 25 mg by mouth daily.        Marland Kitchen levothyroxine (SYNTHROID, LEVOTHROID) 88 MCG tablet Take 88 mcg by mouth daily.        . pantoprazole (PROTONIX) 40 MG tablet Take 1 tablet (40 mg total) by mouth daily. Take 30 minutes before first meal of the day  30 tablet  3  . rosuvastatin (CRESTOR) 5 MG tablet Take  5 mg by mouth daily.        Marland Kitchen zolpidem (AMBIEN) 10 MG tablet Take 10 mg by mouth at bedtime as needed for sleep.       . Linaclotide (LINZESS) 145 MCG CAPS capsule Take 1 capsule (145 mcg total) by mouth daily. 30 minutes before breakfast  30 capsule  3  . methocarbamol (ROBAXIN) 500 MG tablet Take 500 mg by mouth 4 (four) times daily.      . multivitamin (PROSIGHT) TABS Take 1 tablet by mouth daily.      . rizatriptan (MAXALT-MLT) 10 MG  disintegrating tablet Take 1 tablet (10 mg total) by mouth 3 (three) times daily as needed for migraine. May repeat in 2 hours if needed  12 tablet  3   No facility-administered medications prior to visit.    PAST MEDICAL HISTORY: Past Medical History  Diagnosis Date  . Hypercholesteremia   . Hypothyroid   . Hypertension   . Menopausal symptoms   . Vertigo 05/13/2012  . Headache(784.0) 05/13/2012  . Neck pain 05/13/2012  . Hot flashes   . Breast cancer     PAST SURGICAL HISTORY: Past Surgical History  Procedure Laterality Date  . Thyroid surgery    . Cholecystectomy    . Abdominal hysterectomy    . Esophagogastroduodenoscopy  09/17/2008    RMR:Two mid esophageal diverticula/Small benign cystic mucosal lesions distal esophagus of doubtful  clinical significance, stable for least 5 years/ Small hiatal hernia, otherwise normal stomach D1, D2  . Colonoscopy  09/17/2008    CNO:BSJG tortuous, but otherwise normal-appearing colon/. Scattered diverticula  . Esophagogastroduodenoscopy    02/10/2003    GEZ:MOQHUT esophageal 3 cystic lesions without luminal compromise or evidence/Nonerosive antral gastritis/The esophagus was dilated by passing 56 [place] Maloney dilator.Marland Kitchen Epic notes states +H.pylori gastritis. treatment completed per epic notes.   . Eus  Aug 2011    Dr. Newman Pies: EGD with multiple lower esophageal submucosal nodules, stomach and duodenum normal, EUS with esophageal duplication cysts  . Esophagogastroduodenoscopy (egd) with esophageal dilation N/A 02/03/2013    Procedure: ESOPHAGOGASTRODUODENOSCOPY (EGD) WITH ESOPHAGEAL DILATION;  Surgeon: Daneil Dolin, MD;  Location: [facility];  Service: Endoscopy;  Laterality: N/A;  8:45    FAMILY HISTORY: Family History  Problem Relation Age of Onset  . Lupus Sister   . Hypertension Brother   . Diabetes Brother   . Hypertension Brother   . Diabetes Brother   . Multiple sclerosis Mother   . Cancer Father   . Colon cancer Neg Hx      SOCIAL HISTORY: History   Social History  . Marital Status: Married    Spouse Name: Joe    Number of Children: 3  . Years of Education: 11   Occupational History  . Retired    Social History Main Topics  . Smoking status: Never Smoker   . Smokeless tobacco: Never Used  . Alcohol Use: No  . Drug Use: No  . Sexual Activity: Yes    Birth Control/ Protection: None, Surgical     Comment: HYST   Other Topics Concern  . Not on file   Social History Narrative   Patient lives at home with her husband Audelia Knape.    Patient has 3 children.    Patient is retired.    Patient has an 11th grade education.            PHYSICAL EXAM  Filed Vitals:   03/14/13 0950 03/14/13 0954  BP: 134/75  129/75  Pulse: 74 79  Height: 5' 5.5" (1.664 m)   Weight: 166 lb (75.297 kg)    Body mass index is 27.19 kg/(m^2).  Generalized: Well developed, in no acute distress  Head: normocephalic and atraumatic,. Oropharynx benign  Neck: Supple, no carotid bruits  Cardiac: Regular rate rhythm, no murmur  Skin  No significant peripheral edema  Neurological examination   Mentation: Alert oriented to time, place, history taking. Follows all commands speech and language fluent  Cranial nerve II-XII: Pupils were equal round reactive to light extraocular movements were full, visual field were full on confrontational test. Facial sensation and strength were normal. hearing was intact to finger rubbing bilaterally. Uvula tongue midline. head turning and shoulder shrug were normal and symmetric.Tongue protrusion into cheek strength was normal. Motor: normal bulk and tone, full strength in the BUE, BLE, fine finger movements normal, no pronator drift. No focal weakness Sensory: normal and symmetric to light touch, pinprick, and  vibration  Coordination: finger-nose-finger, heel-to-shin bilaterally, no dysmetria Reflexes: Brachioradialis 2/2, biceps 2/2, triceps 2/2, patellar 2/2, Achilles 2/2, plantar  responses were flexor bilaterally. Gait and Station: Rising up from seated position without assistance, normal stance,  moderate stride, good arm swing, smooth turning, able to perform tiptoe, and heel walking without difficulty. Tandem gait is steady  DIAGNOSTIC DATA (LABS, IMAGING, TESTING) - I reviewed patient records, labs, notes, testing and imaging myself where available.  Lab Results  Component Value Date   WBC 8.2 03/05/2013   HGB 12.3 03/05/2013   HCT 38.4 03/05/2013   MCV 87.2 03/05/2013   PLT 279 03/05/2013      Component Value Date/Time   NA 141 03/05/2013 0818   NA 137 11/20/2012 1609   K 3.1* 03/05/2013 0818   K 3.4* 11/20/2012 1609   CL 98 11/20/2012 1609   CO2 28 03/05/2013 0818   CO2 29 11/20/2012 1609   GLUCOSE 108 03/05/2013 0818   GLUCOSE 102* 11/20/2012 1609   BUN 8.6 03/05/2013 0818   BUN 14 11/20/2012 1609   CREATININE 0.8 03/05/2013 0818   CREATININE 0.87 11/20/2012 1609   CALCIUM 9.3 03/05/2013 0818   CALCIUM 9.4 11/20/2012 1609   PROT 7.4 03/05/2013 0818   PROT 8.1 11/20/2012 1609   ALBUMIN 3.8 03/05/2013 0818   ALBUMIN 3.9 11/20/2012 1609   AST 18 03/05/2013 0818   AST 21 11/20/2012 1609   ALT 17 03/05/2013 0818   ALT 15 11/20/2012 1609   ALKPHOS 65 03/05/2013 0818   ALKPHOS 65 11/20/2012 1609   BILITOT 0.51 03/05/2013 0818   BILITOT 0.4 11/20/2012 1609   GFRNONAA 69* 11/20/2012 1609   GFRAA 80* 11/20/2012 1609       ASSESSMENT AND PLAN  63 y.o. year old female  has a past medical history of Vertigo (05/13/2012); UUVOZDGU(440.3) (05/13/2012); Neck pain (05/13/2012);  and Breast cancer. here to followup. Gabapentin has been beneficial for her headache and neck pain according to the patient however she stopped the medication because she ran out of refills. She is not going to pain management at present  Try Gabapentin  100 to 200mg  po at hs  F/U in 6 months Dennie Bible, [facility], [facility], APRN  [facility] [address] [place], [place]  [address] [phone]

## 2013-03-25 NOTE — Pre-Procedure Instructions (Signed)
Trivia LORRANE MCCAY  03/25/2013   Your procedure is scheduled on:  Thurs, Mar 5 @ 12:00 PM  Report to [facility] Stay Entrance A  at 10:00 AM.  Call this number if you have problems the morning of surgery: [phone]   Remember:   Do not eat food or drink liquids after midnight.   Take these medicines the morning of surgery with A SIP OF WATER: Flonase(Fluticasone),Synthroid(Levothyroxine),and Pantoprazole(Protonix)               Stop taking your Aspirin and Ibuprofen. No Goody's,BC's,Aleve,Fish Oil,or any Herbal Medications   Do not wear jewelry, make-up or nail polish.  Do not wear lotions, powders, or perfumes. You may wear deodorant.  Do not shave 48 hours prior to surgery.   Do not bring valuables to the hospital.  [facility] is not responsible                  for any belongings or valuables.               Contacts, dentures or bridgework may not be worn into surgery.  Leave suitcase in the car. After surgery it may be brought to your room.  For patients admitted to the hospital, discharge time is determined by your                treatment team.               Patients discharged the day of surgery will not be allowed to drive  home.    Special Instructions:  [facility] - Preparing for Surgery  Before surgery, you can play an important role.  Because skin is not sterile, your skin needs to be as free of germs as possible.  You can reduce the number of germs on you skin by washing with CHG (chlorahexidine gluconate) soap before surgery.  CHG is an antiseptic cleaner which kills germs and bonds with the skin to continue killing germs even after washing.  Please DO NOT use if you have an allergy to CHG or antibacterial soaps.  If your skin becomes reddened/irritated stop using the CHG and inform your nurse when you arrive at Short Stay.  Do not shave (including legs and underarms) for at least 48 hours prior to the first CHG shower.  You may shave your face.  Please follow  these instructions carefully:   1.  Shower with CHG Soap the night before surgery and the                                morning of Surgery.  2.  If you choose to wash your hair, wash your hair first as usual with your       normal shampoo.  3.  After you shampoo, rinse your hair and body thoroughly to remove the                      Shampoo.  4.  Use CHG as you would any other liquid soap.  You can apply chg directly       to the skin and wash gently with scrungie or a clean washcloth.  5.  Apply the CHG Soap to your body ONLY FROM THE NECK DOWN.        Do not use on open wounds or open sores.  Avoid contact with your eyes,  ears, mouth and genitals (private parts).  Wash genitals (private parts)       with your normal soap.  6.  Wash thoroughly, paying special attention to the area where your surgery        will be performed.  7.  Thoroughly rinse your body with warm water from the neck down.  8.  DO NOT shower/wash with your normal soap after using and rinsing off       the CHG Soap.  9.  Pat yourself dry with a clean towel.            10.  Wear clean pajamas.            11.  Place clean sheets on your bed the night of your first shower and do not        sleep with pets.  Day of Surgery  Do not apply any lotions/deoderants the morning of surgery.  Please wear clean clothes to the hospital/surgery center.     Please read over the following fact sheets that you were given: Pain Booklet, Coughing and Deep Breathing and Surgical Site Infection Prevention

## 2013-03-26 ENCOUNTER — Encounter (HOSPITAL_COMMUNITY)
Admission: RE | Admit: 2013-03-26 | Discharge: 2013-03-26 | Disposition: A | Discharge status: Home / Self Care | Payer: Medicare PPO | Source: Ambulatory Visit | Attending: General Surgery | Admitting: General Surgery

## 2013-03-26 ENCOUNTER — Encounter (HOSPITAL_COMMUNITY): Payer: Self-pay

## 2013-03-26 ENCOUNTER — Ambulatory Visit (HOSPITAL_COMMUNITY)
Admission: RE | Admit: 2013-03-26 | Discharge: 2013-03-26 | Disposition: A | Discharge status: Home / Self Care | Payer: Medicare PPO | Source: Ambulatory Visit | Attending: Anesthesiology | Admitting: Anesthesiology

## 2013-03-26 DIAGNOSIS — Z01818 Encounter for other preprocedural examination: Secondary | ICD-10-CM | POA: Insufficient documentation

## 2013-03-26 DIAGNOSIS — Z01812 Encounter for preprocedural laboratory examination: Secondary | ICD-10-CM | POA: Insufficient documentation

## 2013-03-26 DIAGNOSIS — Z01810 Encounter for preprocedural cardiovascular examination: Secondary | ICD-10-CM | POA: Insufficient documentation

## 2013-03-26 HISTORY — DX: Diverticulosis of intestine, part unspecified, without perforation or abscess without bleeding: K57.90

## 2013-03-26 HISTORY — DX: Unspecified osteoarthritis, unspecified site: M19.90

## 2013-03-26 HISTORY — DX: Personal history of other medical treatment: Z92.89

## 2013-03-26 HISTORY — DX: Personal history of colon polyps, unspecified: Z86.0100

## 2013-03-26 HISTORY — DX: Constipation, unspecified: K59.00

## 2013-03-26 HISTORY — DX: Personal history of colonic polyps: Z86.010

## 2013-03-26 HISTORY — DX: Insomnia, unspecified: G47.00

## 2013-03-26 HISTORY — DX: Personal history of urinary (tract) infections: Z87.440

## 2013-03-26 HISTORY — DX: Polyneuropathy, unspecified: G62.9

## 2013-03-26 LAB — BASIC METABOLIC PANEL
BUN: 10 mg/dL (ref 6–23)
CO2: 25 mEq/L (ref 19–32)
CREATININE: 0.79 mg/dL (ref 0.50–1.10)
Calcium: 9.2 mg/dL (ref 8.4–10.5)
Chloride: 101 mEq/L (ref 96–112)
GFR calc non Af Amer: 86 mL/min — ABNORMAL LOW (ref >90)
Glucose, Bld: 103 mg/dL — ABNORMAL HIGH (ref 70–99)
POTASSIUM: 3.6 meq/L — AB (ref 3.7–5.3)
Sodium: 140 mEq/L (ref 137–147)

## 2013-03-26 LAB — CBC
HCT: 37.4 % (ref 36.0–46.0)
Hemoglobin: 12 g/dL (ref 12.0–15.0)
MCH: 28 pg (ref 26.0–34.0)
MCHC: 32.1 g/dL (ref 30.0–36.0)
MCV: 87.4 fL (ref 78.0–100.0)
Platelets: 310 10*3/uL (ref 150–400)
RBC: 4.28 MIL/uL (ref 3.87–5.11)
RDW: 13.1 % (ref 11.5–15.5)
WBC: 6 10*3/uL (ref 4.0–10.5)

## 2013-03-26 MED ORDER — CHLORHEXIDINE GLUCONATE 4 % EX LIQD: 1.0000 "application " | Freq: Once | CUTANEOUS | Status: DC

## 2013-03-26 NOTE — Progress Notes (Addendum)
Pt doesn't have a cardiologist  Denies ever having an echo/stress test/heart cath  Denies EKG or CXR in past yr    Medical Md is Dr.Roy Willey Blade

## 2013-04-02 MED ORDER — CEFAZOLIN SODIUM-DEXTROSE 2-3 GM-% IV SOLR
2.0000 g | INTRAVENOUS | Status: AC
  Administered 2013-04-03: 2 g via INTRAVENOUS
  Filled 2013-04-02: qty 50

## 2013-04-03 ENCOUNTER — Ambulatory Visit (HOSPITAL_COMMUNITY)
Admission: RE | Admit: 2013-04-03 | Discharge: 2013-04-03 | Disposition: A | Discharge status: Home / Self Care | Payer: Medicare PPO | Source: Ambulatory Visit | Attending: General Surgery | Admitting: General Surgery

## 2013-04-03 ENCOUNTER — Encounter (HOSPITAL_COMMUNITY): Payer: Self-pay | Admitting: *Deleted

## 2013-04-03 ENCOUNTER — Other Ambulatory Visit (INDEPENDENT_AMBULATORY_CARE_PROVIDER_SITE_OTHER): Payer: Self-pay | Admitting: General Surgery

## 2013-04-03 ENCOUNTER — Ambulatory Visit
Admission: RE | Admit: 2013-04-03 | Discharge: 2013-04-03 | Disposition: A | Discharge status: Home / Self Care | Payer: Medicare PPO | Source: Ambulatory Visit | Attending: General Surgery | Admitting: General Surgery

## 2013-04-03 ENCOUNTER — Encounter (HOSPITAL_COMMUNITY)
Admission: RE | Disposition: A | Discharge status: Home / Self Care | Payer: Self-pay | Source: Ambulatory Visit | Attending: General Surgery

## 2013-04-03 ENCOUNTER — Encounter (HOSPITAL_COMMUNITY): Payer: Medicare PPO | Admitting: Anesthesiology

## 2013-04-03 ENCOUNTER — Ambulatory Visit (HOSPITAL_COMMUNITY): Payer: Medicare PPO | Admitting: Anesthesiology

## 2013-04-03 DIAGNOSIS — D0590 Unspecified type of carcinoma in situ of unspecified breast: Secondary | ICD-10-CM | POA: Insufficient documentation

## 2013-04-03 DIAGNOSIS — Z885 Allergy status to narcotic agent status: Secondary | ICD-10-CM | POA: Insufficient documentation

## 2013-04-03 DIAGNOSIS — E039 Hypothyroidism, unspecified: Secondary | ICD-10-CM | POA: Insufficient documentation

## 2013-04-03 DIAGNOSIS — D0511 Intraductal carcinoma in situ of right breast: Secondary | ICD-10-CM

## 2013-04-03 DIAGNOSIS — Z79899 Other long term (current) drug therapy: Secondary | ICD-10-CM | POA: Insufficient documentation

## 2013-04-03 DIAGNOSIS — C50411 Malignant neoplasm of upper-outer quadrant of right female breast: Secondary | ICD-10-CM

## 2013-04-03 DIAGNOSIS — Z7982 Long term (current) use of aspirin: Secondary | ICD-10-CM | POA: Insufficient documentation

## 2013-04-03 DIAGNOSIS — I10 Essential (primary) hypertension: Secondary | ICD-10-CM | POA: Insufficient documentation

## 2013-04-03 HISTORY — PX: BREAST LUMPECTOMY WITH NEEDLE LOCALIZATION AND AXILLARY SENTINEL LYMPH NODE BX: SHX5760

## 2013-04-03 HISTORY — PX: BREAST LUMPECTOMY: SHX2

## 2013-04-03 SURGERY — BREAST LUMPECTOMY WITH NEEDLE LOCALIZATION AND AXILLARY SENTINEL LYMPH NODE BX
Anesthesia: General | Site: Breast | Laterality: Right

## 2013-04-03 MED ORDER — BUPIVACAINE-EPINEPHRINE 0.25% -1:200000 IJ SOLN
INTRAMUSCULAR | Status: DC | PRN
  Administered 2013-04-03: 27 mL

## 2013-04-03 MED ORDER — TRAMADOL HCL 50 MG PO TABS
ORAL_TABLET | ORAL | Status: AC
  Filled 2013-04-03: qty 2

## 2013-04-03 MED ORDER — FENTANYL CITRATE 0.05 MG/ML IJ SOLN
INTRAMUSCULAR | Status: AC
  Filled 2013-04-03: qty 5

## 2013-04-03 MED ORDER — DEXTROSE 5 % IV SOLN
INTRAVENOUS | Status: DC | PRN
  Administered 2013-04-03: 12:00:00 via INTRAVENOUS

## 2013-04-03 MED ORDER — ONDANSETRON HCL 4 MG/2ML IJ SOLN: 4.0000 mg | Freq: Once | INTRAMUSCULAR | Status: DC | PRN

## 2013-04-03 MED ORDER — LIDOCAINE HCL (CARDIAC) 20 MG/ML IV SOLN
INTRAVENOUS | Status: AC
  Filled 2013-04-03: qty 5

## 2013-04-03 MED ORDER — TECHNETIUM TC 99M SULFUR COLLOID FILTERED
1.0000 | Freq: Once | INTRAVENOUS | Status: AC | PRN
  Administered 2013-04-03: 1 via INTRADERMAL

## 2013-04-03 MED ORDER — BUPIVACAINE-EPINEPHRINE (PF) 0.25% -1:200000 IJ SOLN
INTRAMUSCULAR | Status: AC
  Filled 2013-04-03: qty 30

## 2013-04-03 MED ORDER — OXYCODONE HCL 5 MG PO TABS: 5.0000 mg | ORAL_TABLET | Freq: Once | ORAL | Status: DC | PRN

## 2013-04-03 MED ORDER — ONDANSETRON HCL 4 MG/2ML IJ SOLN
INTRAMUSCULAR | Status: DC | PRN
  Administered 2013-04-03: 4 mg via INTRAVENOUS

## 2013-04-03 MED ORDER — PHENYLEPHRINE 40 MCG/ML (10ML) SYRINGE FOR IV PUSH (FOR BLOOD PRESSURE SUPPORT)
PREFILLED_SYRINGE | INTRAVENOUS | Status: AC
  Filled 2013-04-03: qty 10

## 2013-04-03 MED ORDER — FENTANYL CITRATE 0.05 MG/ML IJ SOLN
50.0000 ug | INTRAMUSCULAR | Status: DC | PRN
  Administered 2013-04-03: 50 ug via INTRAVENOUS

## 2013-04-03 MED ORDER — OXYCODONE HCL 5 MG/5ML PO SOLN: 5.0000 mg | Freq: Once | ORAL | Status: DC | PRN

## 2013-04-03 MED ORDER — DEXAMETHASONE SODIUM PHOSPHATE 4 MG/ML IJ SOLN
INTRAMUSCULAR | Status: DC | PRN
  Administered 2013-04-03: 4 mg via INTRAVENOUS

## 2013-04-03 MED ORDER — SODIUM CHLORIDE 0.9 % IJ SOLN
INTRAMUSCULAR | Status: DC | PRN
  Administered 2013-04-03: 13:00:00 via INTRAMUSCULAR

## 2013-04-03 MED ORDER — METHYLENE BLUE 1 % INJ SOLN
INTRAMUSCULAR | Status: AC
  Filled 2013-04-03: qty 10

## 2013-04-03 MED ORDER — HYDROMORPHONE HCL PF 1 MG/ML IJ SOLN
0.2500 mg | INTRAMUSCULAR | Status: DC | PRN
  Administered 2013-04-03 (×2): 0.25 mg via INTRAVENOUS

## 2013-04-03 MED ORDER — EPHEDRINE SULFATE 50 MG/ML IJ SOLN: INTRAMUSCULAR | Status: DC | PRN

## 2013-04-03 MED ORDER — PROPOFOL 10 MG/ML IV BOLUS
INTRAVENOUS | Status: DC | PRN
  Administered 2013-04-03: 50 mg via INTRAVENOUS
  Administered 2013-04-03: 150 mg via INTRAVENOUS

## 2013-04-03 MED ORDER — MEPERIDINE HCL 25 MG/ML IJ SOLN: 6.2500 mg | INTRAMUSCULAR | Status: DC | PRN

## 2013-04-03 MED ORDER — PHENYLEPHRINE HCL 10 MG/ML IJ SOLN
INTRAMUSCULAR | Status: DC | PRN
  Administered 2013-04-03: 80 ug via INTRAVENOUS
  Administered 2013-04-03 (×3): 40 ug via INTRAVENOUS

## 2013-04-03 MED ORDER — PROPOFOL 10 MG/ML IV BOLUS
INTRAVENOUS | Status: AC
  Filled 2013-04-03: qty 20

## 2013-04-03 MED ORDER — TRAMADOL HCL 50 MG PO TABS
50.0000 mg | ORAL_TABLET | Freq: Once | ORAL | Status: AC
  Administered 2013-04-03: 100 mg via ORAL

## 2013-04-03 MED ORDER — SUCCINYLCHOLINE CHLORIDE 20 MG/ML IJ SOLN
INTRAMUSCULAR | Status: DC | PRN
  Administered 2013-04-03: 100 mg via INTRAVENOUS

## 2013-04-03 MED ORDER — LACTATED RINGERS IV SOLN
INTRAVENOUS | Status: DC | PRN
  Administered 2013-04-03 (×2): via INTRAVENOUS

## 2013-04-03 MED ORDER — MIDAZOLAM HCL 2 MG/2ML IJ SOLN
INTRAMUSCULAR | Status: AC
  Filled 2013-04-03: qty 2

## 2013-04-03 MED ORDER — FENTANYL CITRATE 0.05 MG/ML IJ SOLN
INTRAMUSCULAR | Status: DC | PRN
  Administered 2013-04-03: 50 ug via INTRAVENOUS

## 2013-04-03 MED ORDER — SUCCINYLCHOLINE CHLORIDE 20 MG/ML IJ SOLN
INTRAMUSCULAR | Status: AC
  Filled 2013-04-03: qty 1

## 2013-04-03 MED ORDER — 0.9 % SODIUM CHLORIDE (POUR BTL) OPTIME
TOPICAL | Status: DC | PRN
  Administered 2013-04-03: 1000 mL

## 2013-04-03 MED ORDER — LACTATED RINGERS IV SOLN
INTRAVENOUS | Status: DC
  Administered 2013-04-03: 11:00:00 via INTRAVENOUS

## 2013-04-03 MED ORDER — FENTANYL CITRATE 0.05 MG/ML IJ SOLN
INTRAMUSCULAR | Status: AC
  Filled 2013-04-03: qty 2

## 2013-04-03 MED ORDER — ARTIFICIAL TEARS OP OINT
TOPICAL_OINTMENT | OPHTHALMIC | Status: AC
  Filled 2013-04-03: qty 3.5

## 2013-04-03 MED ORDER — TRAMADOL HCL 50 MG PO TABS: 50.0000 mg | ORAL_TABLET | Freq: Once | ORAL | Status: DC

## 2013-04-03 MED ORDER — MIDAZOLAM HCL 2 MG/2ML IJ SOLN
1.0000 mg | INTRAMUSCULAR | Status: DC | PRN
  Administered 2013-04-03: 1 mg via INTRAVENOUS

## 2013-04-03 MED ORDER — HYDROMORPHONE HCL PF 1 MG/ML IJ SOLN
INTRAMUSCULAR | Status: AC
  Administered 2013-04-03: 0.25 mg via INTRAVENOUS
  Filled 2013-04-03: qty 1

## 2013-04-03 MED ORDER — LIDOCAINE HCL (CARDIAC) 20 MG/ML IV SOLN
INTRAVENOUS | Status: DC | PRN
  Administered 2013-04-03: 70 mg via INTRAVENOUS

## 2013-04-03 MED ORDER — HYDROCODONE-ACETAMINOPHEN 5-325 MG PO TABS
1.0000 | ORAL_TABLET | ORAL | Status: DC | PRN
Start: 2013-04-03 — End: 2014-03-27

## 2013-04-03 MED ORDER — ONDANSETRON HCL 4 MG/2ML IJ SOLN
INTRAMUSCULAR | Status: AC
  Filled 2013-04-03: qty 2

## 2013-04-03 MED ORDER — TRAMADOL HCL 50 MG PO TABS: 50.0000 mg | ORAL_TABLET | Freq: Four times a day (QID) | ORAL | Status: DC | PRN

## 2013-04-03 SURGICAL SUPPLY — 51 items
ADH SKN CLS APL DERMABOND .7 (GAUZE/BANDAGES/DRESSINGS) ×1
APPLIER CLIP 9.375 MED OPEN (MISCELLANEOUS) ×3
APR CLP MED 9.3 20 MLT OPN (MISCELLANEOUS) ×1
BINDER BREAST LRG (GAUZE/BANDAGES/DRESSINGS) IMPLANT
BINDER BREAST XLRG (GAUZE/BANDAGES/DRESSINGS) ×2 IMPLANT
BLADE SURG 10 STRL SS (BLADE) ×3 IMPLANT
BLADE SURG 15 STRL LF DISP TIS (BLADE) ×1 IMPLANT
BLADE SURG 15 STRL SS (BLADE) ×3
CANISTER SUCTION 2500CC (MISCELLANEOUS) ×3 IMPLANT
CHLORAPREP W/TINT 26ML (MISCELLANEOUS) ×3 IMPLANT
CLIP APPLIE 9.375 MED OPEN (MISCELLANEOUS) IMPLANT
CONT SPEC 4OZ CLIKSEAL STRL BL (MISCELLANEOUS) ×11 IMPLANT
COVER PROBE W GEL 5X96 (DRAPES) ×3 IMPLANT
COVER SURGICAL LIGHT HANDLE (MISCELLANEOUS) ×3 IMPLANT
DERMABOND ADVANCED (GAUZE/BANDAGES/DRESSINGS) ×2
DERMABOND ADVANCED .7 DNX12 (GAUZE/BANDAGES/DRESSINGS) ×1 IMPLANT
DEVICE DUBIN SPECIMEN MAMMOGRA (MISCELLANEOUS) ×3 IMPLANT
DRAPE CHEST BREAST 15X10 FENES (DRAPES) ×3 IMPLANT
DRAPE UTILITY 15X26 W/TAPE STR (DRAPE) ×6 IMPLANT
ELECT COATED BLADE 2.86 ST (ELECTRODE) ×3 IMPLANT
ELECT REM PT RETURN 9FT ADLT (ELECTROSURGICAL) ×3
ELECTRODE REM PT RTRN 9FT ADLT (ELECTROSURGICAL) ×1 IMPLANT
GLOVE BIO SURGEON STRL SZ7.5 (GLOVE) ×5 IMPLANT
GLOVE BIOGEL PI IND STRL 7.0 (GLOVE) IMPLANT
GLOVE BIOGEL PI INDICATOR 7.0 (GLOVE) ×2
GLOVE SURG SS PI 7.0 STRL IVOR (GLOVE) ×2 IMPLANT
GOWN STRL NON-REIN LRG LVL3 (GOWN DISPOSABLE) ×6 IMPLANT
KIT BASIN OR (CUSTOM PROCEDURE TRAY) ×3 IMPLANT
KIT MARKER MARGIN INK (KITS) ×2 IMPLANT
KIT ROOM TURNOVER OR (KITS) ×3 IMPLANT
NDL 18GX1X1/2 (RX/OR ONLY) (NEEDLE) ×1 IMPLANT
NDL HYPO 25GX1X1/2 BEV (NEEDLE) ×2 IMPLANT
NEEDLE 18GX1X1/2 (RX/OR ONLY) (NEEDLE) ×3 IMPLANT
NEEDLE HYPO 25GX1X1/2 BEV (NEEDLE) ×6 IMPLANT
NS IRRIG 1000ML POUR BTL (IV SOLUTION) ×3 IMPLANT
PACK SURGICAL SETUP 50X90 (CUSTOM PROCEDURE TRAY) ×3 IMPLANT
PAD ARMBOARD 7.5X6 YLW CONV (MISCELLANEOUS) ×3 IMPLANT
PENCIL BUTTON HOLSTER BLD 10FT (ELECTRODE) ×3 IMPLANT
SPONGE LAP 18X18 X RAY DECT (DISPOSABLE) ×3 IMPLANT
SUT MNCRL AB 4-0 PS2 18 (SUTURE) ×5 IMPLANT
SUT SILK 2 0 SH (SUTURE) IMPLANT
SUT VIC AB 3-0 54X BRD REEL (SUTURE) ×1 IMPLANT
SUT VIC AB 3-0 BRD 54 (SUTURE) ×6
SUT VIC AB 3-0 SH 18 (SUTURE) ×3 IMPLANT
SYR BULB 3OZ (MISCELLANEOUS) ×3 IMPLANT
SYR CONTROL 10ML LL (SYRINGE) ×6 IMPLANT
TOWEL OR 17X24 6PK STRL BLUE (TOWEL DISPOSABLE) ×3 IMPLANT
TOWEL OR 17X26 10 PK STRL BLUE (TOWEL DISPOSABLE) ×3 IMPLANT
TUBE CONNECTING 12'X1/4 (SUCTIONS) ×1
TUBE CONNECTING 12X1/4 (SUCTIONS) ×2 IMPLANT
YANKAUER SUCT BULB TIP NO VENT (SUCTIONS) ×3 IMPLANT

## 2013-04-03 NOTE — Anesthesia Postprocedure Evaluation (Signed)
Anesthesia Post Note  Patient: [name]  Procedure(s) Performed: Procedure(s) (LRB): BREAST LUMPECTOMY WITH NEEDLE LOCALIZATION AND AXILLARY SENTINEL LYMPH NODE BX (Right)  Anesthesia type: general  Patient location: PACU  Post pain: Pain level controlled  Post assessment: Patient's Cardiovascular Status Stable  Last Vitals:  Filed Vitals:   04/03/13 1615  BP: 120/63  Pulse: 72  Temp: 37.2 C  Resp: 20    Post vital signs: Reviewed and stable  Level of consciousness: sedated  Complications: No apparent anesthesia complications

## 2013-04-03 NOTE — Anesthesia Preprocedure Evaluation (Addendum)
Anesthesia Evaluation  Patient identified by MRN, date of birth, ID band Patient awake    Reviewed: Allergy & Precautions, H&P , NPO status , Patient's Chart, lab work & pertinent test results, reviewed documented beta blocker date and time   Airway Mallampati: I TM Distance: >3 FB Neck ROM: Full    Dental  (+) Edentulous Upper, Partial Lower, Dental Advisory Given   Pulmonary          Cardiovascular hypertension, Pt. on medications     Neuro/Psych    GI/Hepatic   Endo/Other    Renal/GU      Musculoskeletal   Abdominal   Peds  Hematology   Anesthesia Other Findings   Reproductive/Obstetrics                        Anesthesia Physical Anesthesia Plan  ASA: II  Anesthesia Plan: General   Post-op Pain Management:    Induction: Intravenous  Airway Management Planned: LMA  Additional Equipment:   Intra-op Plan:   Post-operative Plan: Extubation in OR  Informed Consent: I have reviewed the patients History and Physical, chart, labs and discussed the procedure including the risks, benefits and alternatives for the proposed anesthesia with the patient or authorized representative who has indicated his/her understanding and acceptance.     Plan Discussed with: CRNA and Surgeon  Anesthesia Plan Comments:       Anesthesia Quick Evaluation

## 2013-04-03 NOTE — Transfer of Care (Signed)
Immediate Anesthesia Transfer of Care Note  Patient: [name]  Procedure(s) Performed: Procedure(s): BREAST LUMPECTOMY WITH NEEDLE LOCALIZATION AND AXILLARY SENTINEL LYMPH NODE BX (Right)  Patient Location: PACU  Anesthesia Type:General  Level of Consciousness: awake and patient cooperative  Airway & Oxygen Therapy: Patient Spontanous Breathing and Patient connected to nasal cannula oxygen  Post-op Assessment: Report given to PACU RN and Post -op Vital signs reviewed and stable  Post vital signs: Reviewed and stable  Complications: No apparent anesthesia complications

## 2013-04-03 NOTE — Preoperative (Signed)
Beta Blockers   Reason not to administer Beta Blockers:Not Applicable 

## 2013-04-03 NOTE — Anesthesia Procedure Notes (Addendum)
Procedure Name: Intubation Date/Time: 04/03/2013 12:22 PM Performed by: Williemae Area B Pre-anesthesia Checklist: Patient identified, Emergency Drugs available, Timeout performed, Suction available and Patient being monitored Patient Re-evaluated:Patient Re-evaluated prior to inductionOxygen Delivery Method: Circle system utilized Preoxygenation: Pre-oxygenation with 100% oxygen Intubation Type: IV induction Ventilation: Mask ventilation without difficulty Laryngoscope Size: Miller and 2 Grade View: Grade I Tube type: Oral Tube size: 7.0 mm Number of attempts: 1 Airway Equipment and Method: Stylet Placement Confirmation: ETT inserted through vocal cords under direct vision,  breath sounds checked- equal and bilateral and positive ETCO2 Secured at: 22 cm Tube secured with: Tape Dental Injury: Teeth and Oropharynx as per pre-operative assessment  Comments: IV induction Singer- intubation AM CRNA atraumatic- no upper teeth prior to laryngoscopy     Procedure Name: LMA Insertion Date/Time: 04/03/2013 12:07 PM Performed by: Williemae Area B Pre-anesthesia Checklist: Emergency Drugs available, Suction available, Patient being monitored and Patient identified Patient Re-evaluated:Patient Re-evaluated prior to inductionOxygen Delivery Method: Circle system utilized Preoxygenation: Pre-oxygenation with 100% oxygen Intubation Type: IV induction Ventilation: Mask ventilation without difficulty LMA: LMA inserted LMA Size: 4.0 Number of attempts: 1 Placement Confirmation: positive ETCO2 and breath sounds checked- equal and bilateral Tube secured with: taped across cheeks. Dental Injury: Teeth and Oropharynx as per pre-operative assessment

## 2013-04-03 NOTE — H&P (View-Only) (Signed)
Patient ID: [name], female   DOB: Feb 17, 1950, 63 y.o.   MRN: [id number]  No chief complaint on file.   HPI [name] is a 63 y.o. female.  We're asked to see the patient in consultation by Dr. 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 The patient is a 63 year old black female who was scheduled for a routine screening mammogram. About a week before her study was supposed to be done she did feel a mass in the outer aspect of the right breast. She denies any breast pain. She denies any discharge or nipple. The mass measured 3 cm by ultrasound and MRI but also had an area of non-mass enhancement inferior to it. The total area covered about 7 cm.  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 The non-mass enhancement is scheduled to be biopsied next week.  HPI  Past Medical History  Diagnosis Date  . Hypercholesteremia   . Hypothyroid   . Hypertension   . Menopausal symptoms   . Vertigo 05/13/2012  . Headache(784.0) 05/13/2012  . Neck pain 05/13/2012  . Hot flashes     Past Surgical History  Procedure Laterality Date  . Thyroid surgery    . Cholecystectomy    . Abdominal hysterectomy    . Esophagogastroduodenoscopy  09/17/2008    RMR:Two mid esophageal diverticula/Small benign cystic mucosal lesions distal esophagus of doubtful  clinical significance, stable for least 5 years/ Small hiatal hernia, otherwise normal stomach D1, D2  . Colonoscopy  09/17/2008    PZW:CHEN tortuous, but otherwise normal-appearing colon/. Scattered diverticula  . Esophagogastroduodenoscopy    02/10/2003    IDP:OEUMPN esophageal 3 cystic lesions without luminal compromise or evidence/Nonerosive antral gastritis/The esophagus was dilated by passing 56 [place] Maloney dilator.Marland Kitchen Epic notes states +H.pylori gastritis. treatment completed per epic notes.   . Eus  Aug 2011    Dr. Newman Pies: EGD with multiple lower esophageal submucosal nodules, stomach and duodenum  normal, EUS with esophageal duplication cysts  . Esophagogastroduodenoscopy (egd) with esophageal dilation N/A 02/03/2013    Procedure: ESOPHAGOGASTRODUODENOSCOPY (EGD) WITH ESOPHAGEAL DILATION;  Surgeon: Daneil Dolin, MD;  Location: [facility];  Service: Endoscopy;  Laterality: N/A;  8:45    Family History  Problem Relation Age of Onset  . Lupus Sister   . Hypertension Brother   . Diabetes Brother   . Hypertension Brother   . Diabetes Brother   . Multiple sclerosis Mother   . Cancer Father   . Colon cancer Neg Hx     Social History History  Substance Use Topics  . Smoking status: Never Smoker   . Smokeless tobacco: Never Used  . Alcohol Use: No    Allergies  Allergen Reactions  . Codeine Nausea And Vomiting  . Percocet [Oxycodone-Acetaminophen] Rash    Current Outpatient Prescriptions  Medication Sig Dispense Refill  . aspirin EC 81 MG tablet Take 81 mg by mouth daily.      . fluticasone (FLONASE) 50 MCG/ACT nasal spray Place 50 sprays into the nose daily as needed for allergies.       . hydrochlorothiazide (HYDRODIURIL) 25 MG tablet Take 25 mg by mouth daily.        Marland Kitchen levothyroxine (SYNTHROID, LEVOTHROID) 88 MCG tablet Take 88 mcg by mouth daily.        . Linaclotide (LINZESS) 145 MCG CAPS capsule Take 1 capsule (145 mcg total) by mouth daily. 30 minutes before breakfast  30  capsule  3  . methocarbamol (ROBAXIN) 500 MG tablet Take 500 mg by mouth 4 (four) times daily.      . multivitamin (PROSIGHT) TABS Take 1 tablet by mouth daily.      . pantoprazole (PROTONIX) 40 MG tablet Take 1 tablet (40 mg total) by mouth daily. Take 30 minutes before first meal of the day  30 tablet  3  . rizatriptan (MAXALT-MLT) 10 MG disintegrating tablet Take 1 tablet (10 mg total) by mouth 3 (three) times daily as needed for migraine. May repeat in 2 hours if needed  12 tablet  3  . rosuvastatin (CRESTOR) 5 MG tablet Take 5 mg by mouth daily.        . zolpidem (AMBIEN) 10 MG tablet Take 10  mg by mouth at bedtime as needed for sleep.        No current facility-administered medications for this visit.    Review of Systems Review of Systems  Constitutional: Negative.   HENT: Negative.   Eyes: Negative.   Respiratory: Negative.   Cardiovascular: Negative.   Gastrointestinal: Negative.   Endocrine: Negative.   Genitourinary: Negative.   Musculoskeletal: Negative.   Skin: Negative.   Allergic/Immunologic: Negative.   Neurological: Negative.   Hematological: Negative.   Psychiatric/Behavioral: Negative.     There were no vitals taken for this visit.  Physical Exam Physical Exam  Constitutional: She is oriented to person, place, and time. She appears well-developed and well-nourished.  HENT:  Head: Normocephalic and atraumatic.  Eyes: Conjunctivae and EOM are normal. Pupils are equal, round, and reactive to light.  Neck: Normal range of motion. Neck supple.  Cardiovascular: Normal rate, regular rhythm and normal heart sounds.   Pulmonary/Chest: Effort normal and breath sounds normal.  There is a palpable fullness in the lateral aspect of the right breast that measures approximately 3 cm. It is mobile and not tethered to the skin of the chest wall. There is no palpable mass in the left breast. There is no palpable axillary, supraclavicular, or cervical lymphadenopathy  Abdominal: Soft. Bowel sounds are normal.  Musculoskeletal: Normal range of motion.  Lymphadenopathy:    She has no cervical adenopathy.  Neurological: She is alert and oriented to person, place, and time.  Skin: Skin is warm and dry.  Psychiatric: She has a normal mood and affect. Her behavior is normal.    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 She is scheduled to have another area of this biopsied next week. If this biopsy is negative then she may be a candidate for breast  conservation if she so chooses. If this biopsy is positive then I think her best option would be a mastectomy and sentinel node mapping. I discussed with her in detail the risk and benefits of these operations as well as some of the technical aspects and she understands.     Plan    Plan for biopsy of a second area in the right breast next week and then the patient will decide what definitive surgery she would like to have. If she chooses mastectomies she might also like to meet with the plastic surgeons to discuss the possibility of reconstruction        TOTH III,Rolondo Pierre S 03/05/2013, 2:24 PM    

## 2013-04-03 NOTE — Op Note (Signed)
04/03/2013  1:57 PM  PATIENT:  [name]  63 y.o. female  PRE-OPERATIVE DIAGNOSIS:  RIGHT BREAST DCIS   POST-OPERATIVE DIAGNOSIS:  RIGHT BREAST DCIS   PROCEDURE:  Procedure(s): BREAST LUMPECTOMY WITH NEEDLE LOCALIZATION AND AXILLARY SENTINEL LYMPH NODE BX (Right)  SURGEON:  Surgeon(s) and Role:    * Merrie Roof, MD - Primary  PHYSICIAN ASSISTANT:   ASSISTANTS: none   ANESTHESIA:   general  EBL:  Total I/O In: 1050 [I.V.:1050] Out: -   BLOOD ADMINISTERED:none  DRAINS: none   LOCAL MEDICATIONS USED:  MARCAINE     SPECIMEN:  Source of Specimen:  right breast tissue and sentinel node X 2 with additional inferior and superior margins  DISPOSITION OF SPECIMEN:  PATHOLOGY  COUNTS:  YES  TOURNIQUET:  * No tourniquets in log *  DICTATION: .Dragon Dictation After informed consent was obtained the patient was brought to the operating room placed in the supine position on the operating room table. After adequate induction of general anesthesia the patient's right breast, chest, and axillary areas were prepped with ChloraPrep, allowed to dry, and draped in usual sterile manner. Earlier in the day the patient underwent injection a 1 mCi of technetium sulfur colloid in the subareolar position on the right. Also earlier in the day the patient underwent a wire localization procedure and the wire was entering the right breast in the lateral aspect and headed medially. At this point, 2 cc of methylene blue 3 cc of injectable saline were also injected in the subareolar tissue on the right breast. The breast was massaged for several minutes. A neoprobe was used to identify a hot spot in the right axilla. A small transverse incision was made with a 15 blade knife. This incision was carried through the skin and subcutaneous tissue sharply with the electrocautery until the axilla was entered. We then retractor was deployed. Using the neoprobe blunt dissection was carried out in the right  axilla until a hot blue lymph node was identified. An adjacent additional blue lymph node was also identified. Both of these lymph nodes were excised by a combination of sharp Bovie dissection and then the lymphatics were controlled with clips. Ex vivo counts on sentinel node #1 were approximately 600. Sentinel node #2 was blue but not hot. No other hot, blue, or palpable lymph nodes were identified in the right axilla. The wound was infiltrated with quarter percent Marcaine. The deep layer the wound was then closed with interrupted 3-0 Vicryl stitches. The skin was then closed with a running 4-0 Monocryl subcuticular stitch. Attention was then turned to the right breast. An elliptical incision was made overlying the path of the wire with a 15 blade knife. This incision was carried through the skin and subcutaneous tissue sharply with electrocautery. Once into the breast tissue the path of the wire could be palpated. A circular portion of breast tissue was excised sharply around the path of the wire. Once this was accomplished the specimen was removed from the breast. The specimen was oriented with the appropriate paint colors. A specimen radiograph showed the clip to be in the center the specimen. Gross evaluation of pathology felt like there was abnormal tissue at the superior and inferior margins. Additional superior and inferior margins were removed sharply with Metzenbaum scissors and marked with a stitch on the nutrition surgical margin. Hemostasis was achieved using the Bovie electrocautery. The wound was infiltrated with quarter percent Marcaine. The breast tissue along the chest wall and  beneath the skin was mobilized sharply with the electrocautery so that the breast tissue could be closed into the cavity. The cavity was marked with clips. The deep layer the wound was closed with interrupted 3-0 Vicryl stitches. The skin was then closed with interrupted 4-0 Monocryl subcuticular stitches. Dermabond  dressings were applied. The patient tolerated procedure well. At the end of the case all needle sponge and instrument counts were correct. The patient was then awakened and taken to recovery in stable condition.  PLAN OF CARE: Discharge to home after PACU  PATIENT DISPOSITION:  PACU - hemodynamically stable.   Delay start of Pharmacological VTE agent (>24hrs) due to surgical blood loss or risk of bleeding: not applicable

## 2013-04-03 NOTE — Interval H&P Note (Signed)
History and Physical Interval Note: The second area disappeared on repeat mri. She has elected breast conservation with sentinel node biopsy.  04/03/2013 11:22 AM  [name]  has presented today for surgery, with the diagnosis of RIGHT BREAST DCIS   The various methods of treatment have been discussed with the patient and family. After consideration of risks, benefits and other options for treatment, the patient has consented to  Procedure(s): BREAST LUMPECTOMY WITH NEEDLE LOCALIZATION AND AXILLARY SENTINEL LYMPH NODE BX (Right) as a surgical intervention .  The patient's history has been reviewed, patient examined, no change in status, stable for surgery.  I have reviewed the patient's chart and labs.  Questions were answered to the patient's satisfaction.     TOTH III,PAUL S

## 2013-04-04 ENCOUNTER — Encounter (HOSPITAL_COMMUNITY): Payer: Self-pay | Admitting: General Surgery

## 2013-04-08 ENCOUNTER — Ambulatory Visit (HOSPITAL_BASED_OUTPATIENT_CLINIC_OR_DEPARTMENT_OTHER): Payer: Medicare PPO | Admitting: Oncology

## 2013-04-08 ENCOUNTER — Encounter: Payer: Self-pay | Admitting: Oncology

## 2013-04-08 ENCOUNTER — Telehealth: Payer: Self-pay | Admitting: *Deleted

## 2013-04-08 VITALS — BP 142/80 | HR 78 | Temp 98.9°F | Resp 18 | Ht 65.0 in | Wt 162.4 lb

## 2013-04-08 DIAGNOSIS — C50411 Malignant neoplasm of upper-outer quadrant of right female breast: Secondary | ICD-10-CM

## 2013-04-08 DIAGNOSIS — Z171 Estrogen receptor negative status [ER-]: Secondary | ICD-10-CM

## 2013-04-08 DIAGNOSIS — D0590 Unspecified type of carcinoma in situ of unspecified breast: Secondary | ICD-10-CM

## 2013-04-08 NOTE — Patient Instructions (Signed)
Keep your appointment with Dr. Marlou Starks and Dr. Valere Dross  I will see you back in 6 months

## 2013-04-08 NOTE — Telephone Encounter (Signed)
appts made and printed...td 

## 2013-04-13 NOTE — Progress Notes (Signed)
OFFICE PROGRESS NOTE  CC  Asencion Noble, MD [address] [place] [address] Dr. Autumn Messing Dr. 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  Breast cancer of upper-outer quadrant of right female breast   Primary site: Breast (Right)   Staging method: AJCC 7th Edition   Clinical: Stage 0 (Tis (DCIS), N0, cM0)   Summary: Stage 0 (Tis (DCIS), N0, cM0)  PRIOR THERAPY:  #1 patient palpated a right breast mass measuring 3.1 cm by ultrasound at the 10:00 position. She subsequently had MRI that revealed a 3.0 cm area of concern. There was also a second lesion noted that was mass like enhancement anterior inferior to the biopsied lesion. She subsequently had a biopsy performed of this lesion. This was negative. Her original pathology revealed a high-grade DCIS with comedonecrosis. Tumor was ER negative PR negative.  #2 patient is status post lumpectomy of the right breast performed on 04/03/2013. 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 SEE COMMENT. - IN SITU CARCINOMA IS 2 MM FROM NEAREST SUPERIOR MARGIN. - IN SITU CARCINOMA IS 5 MM FROM NEAREST INFERIOR MARGIN. - PREVIOUS BIOPSY SITE. - SEE TUMOR SYNOPTIC TEMPLATE BELOW. 2. Lymph node, sentinel, biopsy, Right axillary #1 - ONE LYMPH NODE, NEGATIVE FOR TUMOR (0/1). 3. Lymph node, sentinel, biopsy, Right axillary #2 - ONE LYMPH NODE, NEGATIVE FOR TUMOR (0/1). 4. Breast, excision, right, inferior margin - BENIGN BREAST TISSUE, SEE COMMENT. - NEGATIVE FOR ATYPIA OR MALIGNANCY. - MICROCALCIFICATIONS IDENTIFIED. 5. Breast, excision, right, superior margin - BENIGN BREAST TISSUE, SEE COMMENT. - NEGATIVE FOR ATYPIA OR MALIGNANCY. - SURGICAL MARGIN, NEGATIVE FOR ATYPIA OR MALIGNANCY. 6. Breast, excision, right, additional inferior margin - BENIGN BREAST TISSUE, SEE COMMENT. -  NEGATIVE FOR ATYPIA OR MALIGNANCY. - SURGICAL MARGIN, NEGATIVE FOR ATYPIA OR MALIGNANCY.  3.  CURRENT THERAPY: Proceed with radiation therapy  INTERVAL HISTORY: [name] Resides 63 y.o. female returns for followup visit after the lumpectomy. She tolerated the procedure very well without any problems. She is healing very nicely. She and I and her husband went over the final pathology results. She only has a stage 0 disease. There was no evidence of invasive cancer in the final pathology specimen. Clinically patient seems to be doing well. Today she denies any headaches double vision blurring of vision fevers chills night sweats. No shortness of breath chest pains palpitations. No abdominal pain no diarrhea or constipation. She has no easy bruising or bleeding. She has no myalgias and arthralgias. No peripheral paresthesias or gait disturbances. Remainder of the 10 point review of systems is negative.   MEDICAL HISTORY: Past Medical History  Diagnosis Date  . Hypercholesteremia     takes Crestor daily  . Vertigo     doesn't take any meds  . Neck pain 05/13/2012  . Breast cancer   . Hypertension     takes HCTZ daily  . Hypothyroid     takes Synthroid daily  . Insomnia     takes Ambien nightly as needed  . Headache(784.0)     occasionally  . Neuropathy   . Arthritis   . Constipation   . History of colon polyps   . Diverticulosis   . History of bladder infections   . History of blood transfusion     no abnormal reaction noted    ALLERGIES:  is allergic to codeine and percocet.  MEDICATIONS:  Current Outpatient Prescriptions  Medication Sig Dispense Refill  . aspirin EC 81 MG tablet Take 81 mg by mouth daily.      . fluticasone (FLONASE) 50 MCG/ACT nasal spray Place 50 sprays into the nose daily as needed for allergies.       Marland Kitchen gabapentin (NEURONTIN) 100 MG capsule Take 2 capsules (200 mg total) by mouth at bedtime.  60 capsule  5  . hydrochlorothiazide (HYDRODIURIL) 25 MG tablet  Take 25 mg by mouth daily.        Marland Kitchen HYDROcodone-acetaminophen (NORCO/VICODIN) 5-325 MG per tablet Take 1-2 tablets by mouth every 4 (four) hours as needed.  50 tablet  0  . ibuprofen (ADVIL,MOTRIN) 800 MG tablet Take 800 mg by mouth every 8 (eight) hours as needed for fever, headache or mild pain.       Marland Kitchen levothyroxine (SYNTHROID, LEVOTHROID) 88 MCG tablet Take 88 mcg by mouth daily.        . rosuvastatin (CRESTOR) 5 MG tablet Take 5 mg by mouth daily.        . traMADol (ULTRAM) 50 MG tablet Take 1-2 tablets (50-100 mg total) by mouth every 6 (six) hours as needed.  50 tablet  0  . zolpidem (AMBIEN) 10 MG tablet Take 10 mg by mouth at bedtime as needed for sleep.        No current facility-administered medications for this visit.    SURGICAL HISTORY:  Past Surgical History  Procedure Laterality Date  . Thyroid surgery    . Cholecystectomy    . Esophagogastroduodenoscopy  09/17/2008    RMR:Two mid esophageal diverticula/Small benign cystic mucosal lesions distal esophagus of doubtful  clinical significance, stable for least 5 years/ Small hiatal hernia, otherwise normal stomach D1, D2  . Colonoscopy  09/17/2008    VFI:EPPI tortuous, but otherwise normal-appearing colon/. Scattered diverticula  . Esophagogastroduodenoscopy    02/10/2003    RJJ:OACZYS esophageal 3 cystic lesions without luminal compromise or evidence/Nonerosive antral gastritis/The esophagus was dilated by passing 56 [place] Maloney dilator.Marland Kitchen Epic notes states +H.pylori gastritis. treatment completed per epic notes.   . Eus  Aug 2011    Dr. Newman Pies: EGD with multiple lower esophageal submucosal nodules, stomach and duodenum normal, EUS with esophageal duplication cysts  . Esophagogastroduodenoscopy (egd) with esophageal dilation N/A 02/03/2013    Procedure: ESOPHAGOGASTRODUODENOSCOPY (EGD) WITH ESOPHAGEAL DILATION;  Surgeon: Daneil Dolin, MD;  Location: [facility];  Service: Endoscopy;  Laterality: N/A;  8:45  . Abdominal  hysterectomy      partial  . Breast lumpectomy with needle localization and axillary sentinel lymph node bx Right 04/03/2013    Procedure: BREAST LUMPECTOMY WITH NEEDLE LOCALIZATION AND AXILLARY SENTINEL LYMPH NODE BX;  Surgeon: Merrie Roof, MD;  Location: [place];  Service: General;  Laterality: Right;    REVIEW OF SYSTEMS:  Pertinent items are noted in HPI.     PHYSICAL EXAMINATION: Blood pressure 142/80, pulse 78, temperature 98.9 F (37.2 C), temperature source Oral, resp. rate 18, height 5' 5"  (1.651 m), weight 162 lb 6.4 oz (73.664 kg). Body mass index is 27.02 kg/(m^2). ECOG PERFORMANCE STATUS: 0 - Asymptomatic   General appearance: alert, cooperative and appears stated age Lymph nodes: Cervical, supraclavicular, and axillary nodes normal. Resp: clear to auscultation bilaterally Back: symmetric, no curvature. ROM normal. No CVA tenderness. Cardio: regular rate and rhythm GI: soft, non-tender; bowel sounds normal; no masses,  no organomegaly Extremities: extremities normal, atraumatic, no cyanosis or edema Neurologic: Grossly normal Breasts:  right breast normal without mass, skin or nipple changes or axillary nodes surgical scars noted in the right breast., left breast normal without mass, skin or nipple changes or axillary nodes, .   LABORATORY DATA: Lab Results  Component Value Date   WBC 6.0 03/26/2013   HGB 12.0 03/26/2013   HCT 37.4 03/26/2013   MCV 87.4 03/26/2013   PLT 310 03/26/2013      Chemistry      Component Value Date/Time   NA 140 03/26/2013 0941   NA 141 03/05/2013 0818   K 3.6* 03/26/2013 0941   K 3.1* 03/05/2013 0818   CL 101 03/26/2013 0941   CO2 25 03/26/2013 0941   CO2 28 03/05/2013 0818   BUN 10 03/26/2013 0941   BUN 8.6 03/05/2013 0818   CREATININE 0.79 03/26/2013 0941   CREATININE 0.8 03/05/2013 0818      Component Value Date/Time   CALCIUM 9.2 03/26/2013 0941   CALCIUM 9.3 03/05/2013 0818   ALKPHOS 65 03/05/2013 0818   ALKPHOS 65 11/20/2012 1609   AST 18  03/05/2013 0818   AST 21 11/20/2012 1609   ALT 17 03/05/2013 0818   ALT 15 11/20/2012 1609   BILITOT 0.51 03/05/2013 0818   BILITOT 0.4 11/20/2012 1609     Diagnosis 1. 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 SEE COMMENT. - IN SITU CARCINOMA IS 2 MM FROM NEAREST SUPERIOR MARGIN. - IN SITU CARCINOMA IS 5 MM FROM NEAREST INFERIOR MARGIN. - PREVIOUS BIOPSY SITE. - SEE TUMOR SYNOPTIC TEMPLATE BELOW. 2. Lymph node, sentinel, biopsy, Right axillary #1 - ONE LYMPH NODE, NEGATIVE FOR TUMOR (0/1). 3. Lymph node, sentinel, biopsy, Right axillary #2 - ONE LYMPH NODE, NEGATIVE FOR TUMOR (0/1). 4. Breast, excision, right, inferior margin - BENIGN BREAST TISSUE, SEE COMMENT. - NEGATIVE FOR ATYPIA OR MALIGNANCY. - MICROCALCIFICATIONS IDENTIFIED. 5. Breast, excision, right, superior margin - BENIGN BREAST TISSUE, SEE COMMENT. - NEGATIVE FOR ATYPIA OR MALIGNANCY. - SURGICAL MARGIN, NEGATIVE FOR ATYPIA OR MALIGNANCY. 6. Breast, excision, right, additional inferior margin - BENIGN BREAST TISSUE, SEE COMMENT. - NEGATIVE FOR ATYPIA OR MALIGNANCY. - SURGICAL MARGIN, NEGATIVE FOR ATYPIA OR MALIGNANCY. Microscopic Comment 1. BREAST, IN SITU CARCINOMA Specimen, including laterality: Right breast. Procedure (include lymph node sampling sentinel-non-sentinel: Lumpectomy with sentinel lymph node sampling. Grade of carcinoma: III of III. Necrosis: Present. Estimated tumor size: (gross measurement or glass slide measurement): 3.0 cm, see comment 1 of 4 FINAL for Jaquess, Aylee S ([id number]) Microscopic Comment(continued) Treatment effect: None. If present, treatment effect in breast tissue, lymph nodes or both: N/A Distance to closest margin: 3 mm If margin positive, focally or broadly: N/A Breast prognostic profile: Estrogen receptor: Repeated, previous study demonstrated 0% positivity ([id number]) Progesterone receptor: Repeated, previous  study demonstrated 0% positivity ([id number]) Lymph nodes: Examined: 2 Sentinel 0 Non-sentinel 2 Total Lymph nodes with metastasis: 0 Isolated tumor cells (< 0.2 mm): N/A Micrometastasis ( > 0.2 mm and < 2.0 mm): N/A Macrometastasis (> 2.0 mm): N/A Extranodal extension:N/A TNM: pTis, pN0   RADIOGRAPHIC STUDIES:  Dg Chest 2 View  03/26/2013   CLINICAL DATA:  Pre-admission for breast surgery  EXAM: CHEST  2 VIEW  COMPARISON:  05/02/2010  FINDINGS: Cardiomediastinal silhouette is stable. Mild lower thoracic dextroscoliosis. No acute infiltrate or pleural effusion. No pulmonary edema.  IMPRESSION: No active cardiopulmonary disease.   Electronically Signed   By: Lahoma Crocker M.D.   On: 03/26/2013 09:50   Nm Sentinel Node  Inj-no Rpt (breast)  04/03/2013   CLINICAL DATA: right breast dcis   Sulfur colloid was injected intradermally by the nuclear medicine  technologist for breast cancer sentinel node localization.    Mm Rt Plc Breast Loc Dev   1st Lesion  Inc Mammo Guide  04/03/2013   CLINICAL DATA:  DCIS right breast  EXAM: NEEDLE LOCALIZATION OF THE RIGHT BREAST WITH MAMMO GUIDANCE  COMPARISON:  Previous exams.  FINDINGS: Patient presents for needle localization prior to a pack. I met with the patient and we discussed the procedure of needle localization including benefits and alternatives. We discussed the high likelihood of a successful procedure. We discussed the risks of the procedure, including infection, bleeding, tissue injury, and further surgery. Informed, written consent was given. The usual time-out protocol was performed immediately prior to the procedure.  Using mammographic guidance, sterile technique, 2% lidocaine and a 5 cm modified Kopans needle, a marker clip was localized using lateral medial approach. The films were marked for Dr. Marlou Starks.  Specimen radiograph was performed at surgery center and confirms marker clip and wire present in the tissue sample. The specimen was marked for  pathology.  IMPRESSION: Needle localization right breast. No apparent complications.   Electronically Signed   By: Skipper Cliche M.D.   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 Patient is status post right lumpectomy performed on 04/03/2013. Final pathology as noted above. Postoperatively patient is doing well and is healing very nicely. Her final pathology did reveal an ER-negative PR-negative disease. All nodes were negative there was no evidence of invasive disease.  #2 radiation therapy: Patient is recommended to proceed with adjuvant radiation therapy. This will be performed by Dr. Arloa Koh. I have sent the patient back to him.  #3 patient and I discussed role of antiestrogen therapy. She understands that tamoxifen only prevents ER-positive breast cancers. We certainly could use this for her for future breast cancer prevention but again it is only for ER-positive breast cancers. She and her husband want to think about this. She certainly would not be a candidate for the present breast cancer treatment with tamoxifen since this was an ER negative disease.  #4 patient will be seen back after completion of radiation therapy.      All questions were answered. The patient knows to call the clinic with any problems, questions or concerns. We can certainly see the patient much sooner if necessary.  I spent 15 minutes counseling the patient face to face. The total time spent in the appointment was 20 minutes.    Marcy Panning, MD Medical/Oncology [facility] [phone] (beeper) [phone] (Office)

## 2013-04-14 ENCOUNTER — Encounter (INDEPENDENT_AMBULATORY_CARE_PROVIDER_SITE_OTHER): Payer: Self-pay | Admitting: General Surgery

## 2013-04-14 ENCOUNTER — Ambulatory Visit: Payer: BC Managed Care – PPO | Admitting: Nurse Practitioner

## 2013-04-14 ENCOUNTER — Ambulatory Visit (INDEPENDENT_AMBULATORY_CARE_PROVIDER_SITE_OTHER): Payer: Medicare PPO | Admitting: General Surgery

## 2013-04-14 VITALS — BP 130/74 | HR 75 | Temp 97.8°F | Resp 14 | Ht 65.0 in | Wt 162.0 lb

## 2013-04-14 DIAGNOSIS — D0510 Intraductal carcinoma in situ of unspecified breast: Secondary | ICD-10-CM | POA: Insufficient documentation

## 2013-04-14 DIAGNOSIS — D0590 Unspecified type of carcinoma in situ of unspecified breast: Secondary | ICD-10-CM

## 2013-04-14 NOTE — Progress Notes (Signed)
Subjective:     Patient ID: [name], female   DOB: Jun 13, 1950, 63 y.o.   MRN: [id number]  HPI The patient is a 63 year old black female who is 2 weeks status post right lumpectomy and negative sentinel node biopsy for DCIS of the right breast. Her lymph nodes were negative and her margins were clean. She still complains of some soreness in the right breast  Review of Systems     Objective:   Physical Exam On exam her right breast and axillary incisions are healing nicely with no sign of infection or significant seroma    Assessment:     The patient is 2 weeks status post right lumpectomy for DCIS     Plan:     At this point she has appointments later this week with medical and radiation oncology. I will plan to see her back in 2 months to check her progress

## 2013-04-14 NOTE — Patient Instructions (Signed)
Keep appt with medical and radiation oncology

## 2013-04-15 ENCOUNTER — Encounter: Payer: Self-pay | Admitting: Radiation Oncology

## 2013-04-15 DIAGNOSIS — C50919 Malignant neoplasm of unspecified site of unspecified female breast: Secondary | ICD-10-CM | POA: Insufficient documentation

## 2013-04-15 NOTE — Progress Notes (Signed)
Location of Breast Cancer: right, 10:00 o'clock  Histology per Pathology Report:  04/10/13 Diagnosis 1. 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 SEE COMMENT. - IN SITU CARCINOMA IS 2 MM FROM NEAREST SUPERIOR MARGIN. - IN SITU CARCINOMA IS 5 MM FROM NEAREST INFERIOR MARGIN. - PREVIOUS BIOPSY SITE. - SEE TUMOR SYNOPTIC TEMPLATE BELOW. 2. Lymph node, sentinel, biopsy, Right axillary #1 - ONE LYMPH NODE, NEGATIVE FOR TUMOR (0/1). 3. Lymph node, sentinel, biopsy, Right axillary #2 - ONE LYMPH NODE, NEGATIVE FOR TUMOR (0/1). 4. Breast, excision, right, inferior margin - BENIGN BREAST TISSUE, SEE COMMENT. - NEGATIVE FOR ATYPIA OR MALIGNANCY. - MICROCALCIFICATIONS IDENTIFIED. 5. Breast, excision, right, superior margin - BENIGN BREAST TISSUE, SEE COMMENT. - NEGATIVE FOR ATYPIA OR MALIGNANCY. - SURGICAL MARGIN, NEGATIVE FOR ATYPIA OR MALIGNANCY. 6. Breast, excision, right, additional inferior margin - BENIGN BREAST TISSUE, SEE COMMENT. - NEGATIVE FOR ATYPIA OR MALIGNANCY. - SURGICAL MARGIN, NEGATIVE FOR ATYPIA OR MALIGNANCY.  02/19/13 Diagnosis Breast, right, needle core biopsy, 10 o'clock 6cm/nipple - HIGH GRADE DUCTAL CARCINOMA IN SITU WITH ASSOCIATED COMEDO NECROSIS. PLEASE SEE COMMENT. Microscopic Comment There is a high grade ductal carcinoma with associated comedo necrosis that measures 0.7 cm from the glass slide. ER and PR will be performed and an addendum report will follow. Dr. Gari Crown agrees.  Receptor Status: ER(0), PR (0), Her2-neu ()  Did patient present with symptoms (if so, please note symptoms) or was this found on screening mammography?: pt noted a palpable mass along upper outer quadrant of right breast.  Past/Anticipated interventions by surgeon, if any: 04/03/13 right lumpectomy, node sampling, additional margins excised Dr Excell Seltzer  Past/Anticipated interventions by medical oncology, if any: Chemotherapy  -Dr  Humphrey Rolls: #3 patient and I discussed role of antiestrogen therapy. She understands that tamoxifen only prevents ER-positive breast cancers. We certainly could use this for her for future breast cancer prevention but again it is only for ER-positive breast cancers. She and her husband want to think about this. She certainly would not be a candidate for the present breast cancer treatment with tamoxifen since this was an ER negative disease.  #4 patient will be seen back after completion of radiation therapy.  Lymphedema issues, if any:  no  Pain issues, if any:  Post op pain, tenderness of right breast, axilla. Tramadol 1 tab prn, takes approximately 1 tab daily w/good relief.  SAFETY ISSUES:  Prior radiation? no  Pacemaker/ICD? no  Possible current pregnancy? no  Is the patient on methotrexate? no  Current Complaints / other details:  married    Andria Rhein, [place] 04/15/2013,3:12 PM

## 2013-04-17 ENCOUNTER — Ambulatory Visit
Admission: RE | Admit: 2013-04-17 | Discharge: 2013-04-17 | Disposition: A | Discharge status: Home / Self Care | Payer: Medicare PPO | Source: Ambulatory Visit | Attending: Radiation Oncology | Admitting: Radiation Oncology

## 2013-04-17 ENCOUNTER — Encounter: Payer: Self-pay | Admitting: Radiation Oncology

## 2013-04-17 VITALS — BP 135/82 | HR 79 | Temp 99.4°F | Resp 20 | Wt 161.0 lb

## 2013-04-17 DIAGNOSIS — D0510 Intraductal carcinoma in situ of unspecified breast: Secondary | ICD-10-CM

## 2013-04-17 DIAGNOSIS — D0590 Unspecified type of carcinoma in situ of unspecified breast: Secondary | ICD-10-CM | POA: Insufficient documentation

## 2013-04-17 DIAGNOSIS — C50411 Malignant neoplasm of upper-outer quadrant of right female breast: Secondary | ICD-10-CM

## 2013-04-17 DIAGNOSIS — Z9010 Acquired absence of unspecified breast and nipple: Secondary | ICD-10-CM | POA: Insufficient documentation

## 2013-04-17 NOTE — Addendum Note (Signed)
Encounter addended by: Rexene Edison, MD on: 04/17/2013  9:57 AM<BR>     Documentation filed: Visit Diagnoses, Follow-up Section, LOS Section, Clinical Notes, Notes Section, Problem List

## 2013-04-17 NOTE — Progress Notes (Addendum)
CC: Dr. Asencion Noble, Dr. Autumn Messing III  Followup note: [name] is a pleasant 63 year old female who is seen today for review and scheduling of her radiation therapy in the management of her high-grade DCIS of the right breast. I first saw her at the multidisciplinary breast clinic on 03/05/2013.She noted a palpable mass along the upper-outer quadrant of the right breast. Mammography on 02/19/2013 at [facility] revealed a suspicious mass at 10:00, 6 cm from the right nipple. An ultrasound showed a mass measuring 3.1 x 2.0 x 1.2 cm. There were no abnormal lymph nodes within the axilla. An ultrasound-guided biopsy on 02/19/2013 was diagnostic for high-grade DCIS with comedonecrosis. This was ER/PR negative. Breast MR on 08/27/2013 showed a 3 x 2.4 x 2 cm irregular mass at 10:00 and also non mass like like enhancement anterior and inferior to the mass at 10:00 measuring 7 x 5 x 3 cm. a biopsy was recommended but there was interval resolution of the previously concerning non masslike enhancement felt to represent biopsy changes. She underwent a right partial mastectomy and sentinel lymph node biopsy on 04/03/2013. She is found to have a 3.0 cm high-grade DCIS with comedonecrosis and calcification. Of note is that there were no calcifications seen on her diagnostic mammograms. The closest margin was 2 mm superiorly and 5 mm inferiorly, but additional tissue was taken superiorly which was negative. 2 sentinel lymph nodes were free of metastatic disease. As mentioned previously, her DCIS was hormone receptor negative.  Physical examination: Alert and oriented. Filed Vitals:   04/17/13 0852  BP: 135/82  Pulse: 79  Temp: 99.4 F (37.4 C)  Resp: 20   Head and neck examination: Grossly unremarkable. Nodes: There is no palpable cervical, supraclavicular, or axillary lymphadenopathy. Her right axillary wound is healing well. Chest: Lungs clear. Breasts: There is a partial mastectomy wound along the upper-outer  quadrant of the right breast centered at 10:00. There is surrounding induration as expected. Extremities: Without edema.  Impression: Stage 0 (Tis N0 M0) high-grade DCIS with comedonecrosis of the right breast. I feel that she should receive standard fractionation radiation therapy, 5000 cGy in 25 sessions (no boost). We discussed the potential acute and late toxicities of radiation therapy. Consent is signed today. There is no role for a  preradiation mammogram since there are no calcifications seen on initial diagnostic mammography. Dr. Humphrey Rolls may discuss with her chemo preventative antiestrogen therapy  following radiation therapy.  Plan: As discussed above. We will tentatively get her scheduled for simulation/treatment planning on Monday, April 6.  30 minutes was spent face-to-face with the patient, primarily counseling patient and coordinating her care.

## 2013-04-17 NOTE — Progress Notes (Signed)
Please see the Nurse Progress Note in the MD Initial Consult Encounter for this patient. 

## 2013-05-02 ENCOUNTER — Telehealth: Payer: Self-pay | Admitting: *Deleted

## 2013-05-02 NOTE — Telephone Encounter (Signed)
Mailed after appt letter to pt. 

## 2013-05-05 ENCOUNTER — Ambulatory Visit
Admission: RE | Admit: 2013-05-05 | Discharge: 2013-05-05 | Disposition: A | Discharge status: Home / Self Care | Payer: Medicare PPO | Source: Ambulatory Visit | Attending: Radiation Oncology | Admitting: Radiation Oncology

## 2013-05-05 ENCOUNTER — Ambulatory Visit: Payer: BC Managed Care – PPO | Admitting: Gastroenterology

## 2013-05-05 DIAGNOSIS — C50411 Malignant neoplasm of upper-outer quadrant of right female breast: Secondary | ICD-10-CM

## 2013-05-05 DIAGNOSIS — Z510 Encounter for antineoplastic radiation therapy: Secondary | ICD-10-CM | POA: Insufficient documentation

## 2013-05-05 DIAGNOSIS — C50419 Malignant neoplasm of upper-outer quadrant of unspecified female breast: Secondary | ICD-10-CM | POA: Insufficient documentation

## 2013-05-05 NOTE — Progress Notes (Signed)
Complex simulation/treatment planning note: The patient underwent simulation/treatment planning today in the management of her DCIS of the right breast. She was taken to the CT simulator and placed supine. A custom vac lock immobilization device was constructed on a custom breast board. Her right breast field borders were marked with radiopaque wires along with her partial mastectomy scar. She was then scanned. I contoured her tumor bed and also partial mastectomy scar. She was set up to medial and lateral right breast tangents. I'm prescribing 5000 cGy 25 sessions utilizing 6 MV or mixed energy photons. She is now ready for 3-D simulation.

## 2013-05-08 ENCOUNTER — Encounter: Payer: Self-pay | Admitting: Radiation Oncology

## 2013-05-08 NOTE — Progress Notes (Signed)
  Radiation Oncology         [phone] ________________________________  Name: [name] MRN: [id number]  Date: 05/08/2013  DOB: 04-28-1950  Optical Surface Tracking Plan:  Since intensity modulated radiotherapy (IMRT) and 3D conformal radiation treatment methods are predicated on accurate and precise positioning for treatment, intrafraction motion monitoring is medically necessary to ensure accurate and safe treatment delivery.  The ability to quantify intrafraction motion without excessive ionizing radiation dose can only be performed with optical surface tracking. Accordingly, surface imaging offers the opportunity to obtain 3D measurements of patient position throughout IMRT and 3D treatments without excessive radiation exposure.  I am ordering optical surface tracking for this patient's upcoming course of radiotherapy. ________________________________  Rexene Edison, MD 05/08/2013 8:49 PM    Reference:   Particia Jasper, et al. Surface imaging-based analysis of intrafraction motion for breast radiotherapy patients.Journal of [place], n. 6, nov. 2014. ISSN [id number].   Available at: <http://www.jacmp.org/index.php/jacmp/article/view/4957>.

## 2013-05-08 NOTE — Progress Notes (Signed)
3-D simulation note: The patient underwent 3-D simulation for treatment to her right breast. Dose volume histograms were obtained for the heart, and lungs. We met our departmental guidelines. She was set up to medial and lateral right breast tangents. She was set up to 3 fields on the medial aspect, and 2 fields on the lateral aspect for 5 sets of multileaf collimators/complex treatment devices. I prescribing 5000 cGy in 25 sessions utilizing mixed 6 MV and 10 MV photons.

## 2013-05-12 ENCOUNTER — Ambulatory Visit
Admission: RE | Admit: 2013-05-12 | Discharge: 2013-05-12 | Disposition: A | Discharge status: Home / Self Care | Payer: Medicare PPO | Source: Ambulatory Visit | Attending: Radiation Oncology | Admitting: Radiation Oncology

## 2013-05-12 DIAGNOSIS — C50411 Malignant neoplasm of upper-outer quadrant of right female breast: Secondary | ICD-10-CM

## 2013-05-12 NOTE — Progress Notes (Signed)
Simulation verification note: The patient underwent simulation verification for treatment to her right breast. Her isocenter is in good position and the multileaf collimators contoured the treatment volume appropriately.

## 2013-05-13 ENCOUNTER — Ambulatory Visit
Admission: RE | Admit: 2013-05-13 | Discharge: 2013-05-13 | Disposition: A | Discharge status: Home / Self Care | Payer: Medicare PPO | Source: Ambulatory Visit | Attending: Radiation Oncology | Admitting: Radiation Oncology

## 2013-05-14 ENCOUNTER — Ambulatory Visit
Admission: RE | Admit: 2013-05-14 | Discharge: 2013-05-14 | Disposition: A | Discharge status: Home / Self Care | Payer: Medicare PPO | Source: Ambulatory Visit | Attending: Radiation Oncology | Admitting: Radiation Oncology

## 2013-05-15 ENCOUNTER — Ambulatory Visit
Admission: RE | Admit: 2013-05-15 | Discharge: 2013-05-15 | Disposition: A | Discharge status: Home / Self Care | Payer: Medicare PPO | Source: Ambulatory Visit | Attending: Radiation Oncology | Admitting: Radiation Oncology

## 2013-05-15 DIAGNOSIS — D0510 Intraductal carcinoma in situ of unspecified breast: Secondary | ICD-10-CM

## 2013-05-15 MED ORDER — ALRA NON-METALLIC DEODORANT (RAD-ONC)
1.0000 "application " | Freq: Once | TOPICAL | Status: AC
  Administered 2013-05-15: 1 via TOPICAL

## 2013-05-15 MED ORDER — RADIAPLEXRX EX GEL
Freq: Once | CUTANEOUS | Status: AC
  Administered 2013-05-15: 16:00:00 via TOPICAL

## 2013-05-15 NOTE — Progress Notes (Signed)
Post sim ed completed w/pt who has friend present with her. Gave pt "Radiation and You" booklet w/all pertinent information marked and discussed, re: fatigue, skin irritation/care, nutrition, pain. Gave pt Radiaplex, [place] w/instructions for proper use. Pt verbalized understanding.

## 2013-05-16 ENCOUNTER — Ambulatory Visit
Admission: RE | Admit: 2013-05-16 | Discharge: 2013-05-16 | Disposition: A | Discharge status: Home / Self Care | Payer: Medicare PPO | Source: Ambulatory Visit | Attending: Radiation Oncology | Admitting: Radiation Oncology

## 2013-05-19 ENCOUNTER — Encounter: Payer: Self-pay | Admitting: Radiation Oncology

## 2013-05-19 ENCOUNTER — Ambulatory Visit
Admission: RE | Admit: 2013-05-19 | Discharge: 2013-05-19 | Disposition: A | Discharge status: Home / Self Care | Payer: Medicare PPO | Source: Ambulatory Visit | Attending: Radiation Oncology | Admitting: Radiation Oncology

## 2013-05-19 VITALS — BP 123/78 | HR 63 | Temp 98.8°F | Resp 20 | Wt 160.5 lb

## 2013-05-19 DIAGNOSIS — C50411 Malignant neoplasm of upper-outer quadrant of right female breast: Secondary | ICD-10-CM

## 2013-05-19 NOTE — Progress Notes (Signed)
Weekly Management Note:  Site: Right breast Current Dose:  1000  cGy Projected Dose: 5000  cGy, no boost  Narrative: The patient is seen today for routine under treatment assessment. CBCT/MVCT images/port films were reviewed. The chart was reviewed.   No complaints today except for slight stiffness along her right axilla. She uses Radioplex gel.  Physical Examination:  Filed Vitals:   05/19/13 1111  BP: 123/78  Pulse: 63  Temp: 98.8 F (37.1 C)  Resp: 20  .  Weight: 160 lb 8 oz (72.802 kg). No significant skin changes.  Impression: Tolerating radiation therapy well.  Plan: Continue radiation therapy as planned.

## 2013-05-19 NOTE — Progress Notes (Signed)
Pt denies pain, fatigue, loss of appetite. She is applying Radiaplex to right breast treatment area.

## 2013-05-20 ENCOUNTER — Ambulatory Visit
Admission: RE | Admit: 2013-05-20 | Discharge: 2013-05-20 | Disposition: A | Discharge status: Home / Self Care | Payer: Medicare PPO | Source: Ambulatory Visit | Attending: Radiation Oncology | Admitting: Radiation Oncology

## 2013-05-21 ENCOUNTER — Ambulatory Visit
Admission: RE | Admit: 2013-05-21 | Discharge: 2013-05-21 | Disposition: A | Discharge status: Home / Self Care | Payer: Medicare PPO | Source: Ambulatory Visit | Attending: Radiation Oncology | Admitting: Radiation Oncology

## 2013-05-22 ENCOUNTER — Ambulatory Visit
Admission: RE | Admit: 2013-05-22 | Discharge: 2013-05-22 | Disposition: A | Discharge status: Home / Self Care | Payer: Medicare PPO | Source: Ambulatory Visit | Attending: Radiation Oncology | Admitting: Radiation Oncology

## 2013-05-23 ENCOUNTER — Ambulatory Visit
Admission: RE | Admit: 2013-05-23 | Discharge: 2013-05-23 | Disposition: A | Discharge status: Home / Self Care | Payer: Medicare PPO | Source: Ambulatory Visit | Attending: Radiation Oncology | Admitting: Radiation Oncology

## 2013-05-26 ENCOUNTER — Ambulatory Visit
Admission: RE | Admit: 2013-05-26 | Discharge: 2013-05-26 | Disposition: A | Discharge status: Home / Self Care | Payer: Medicare PPO | Source: Ambulatory Visit | Attending: Radiation Oncology | Admitting: Radiation Oncology

## 2013-05-26 VITALS — BP 130/68 | HR 73 | Temp 98.7°F | Resp 20 | Wt 162.2 lb

## 2013-05-26 DIAGNOSIS — C50411 Malignant neoplasm of upper-outer quadrant of right female breast: Secondary | ICD-10-CM

## 2013-05-26 NOTE — Progress Notes (Signed)
Weekly Management Note:  Site: Right breast Current Dose:  2000  cGy Projected Dose: 5000  cGy, no boost  Narrative: The patient is seen today for routine under treatment assessment. CBCT/MVCT images/port films were reviewed. The chart was reviewed.   She is without complaints today. She uses Radioplex gel.  Physical Examination:  Filed Vitals:   05/26/13 1042  BP: 130/68  Pulse: 73  Temp: 98.7 F (37.1 C)  Resp: 20  .  Weight: 162 lb 3.2 oz (73.573 kg). Very slight hyperpigmentation the skin along the right breast with no areas of desquamation.  Impression: Tolerating radiation therapy well.  Plan: Continue radiation therapy as planned.

## 2013-05-26 NOTE — Progress Notes (Signed)
Pt denies pain, loss of appetite. She states she was fatigued this past Sat. She is applying Radiaplex to right breast treatment area and states she sees very slight darkening of skin in treatment area.

## 2013-05-27 ENCOUNTER — Ambulatory Visit
Admission: RE | Admit: 2013-05-27 | Discharge: 2013-05-27 | Disposition: A | Discharge status: Home / Self Care | Payer: Medicare PPO | Source: Ambulatory Visit | Attending: Radiation Oncology | Admitting: Radiation Oncology

## 2013-05-28 ENCOUNTER — Ambulatory Visit
Admission: RE | Admit: 2013-05-28 | Discharge: 2013-05-28 | Disposition: A | Discharge status: Home / Self Care | Payer: Medicare PPO | Source: Ambulatory Visit | Attending: Radiation Oncology | Admitting: Radiation Oncology

## 2013-05-29 ENCOUNTER — Ambulatory Visit
Admission: RE | Admit: 2013-05-29 | Discharge: 2013-05-29 | Disposition: A | Discharge status: Home / Self Care | Payer: Medicare PPO | Source: Ambulatory Visit | Attending: Radiation Oncology | Admitting: Radiation Oncology

## 2013-05-30 ENCOUNTER — Ambulatory Visit
Admission: RE | Admit: 2013-05-30 | Discharge: 2013-05-30 | Disposition: A | Discharge status: Home / Self Care | Payer: Medicare PPO | Source: Ambulatory Visit | Attending: Radiation Oncology | Admitting: Radiation Oncology

## 2013-06-02 ENCOUNTER — Ambulatory Visit
Admission: RE | Admit: 2013-06-02 | Discharge: 2013-06-02 | Disposition: A | Discharge status: Home / Self Care | Payer: Medicare PPO | Source: Ambulatory Visit | Attending: Radiation Oncology | Admitting: Radiation Oncology

## 2013-06-02 VITALS — BP 138/67 | HR 65 | Temp 98.6°F | Resp 20 | Wt 162.2 lb

## 2013-06-02 DIAGNOSIS — C50411 Malignant neoplasm of upper-outer quadrant of right female breast: Secondary | ICD-10-CM

## 2013-06-02 NOTE — Progress Notes (Signed)
Pt states she is still fatigued, has discomfort in her right axilla. Advised she take Tylenol, Advil prn for axilla discomfort and try to stretch shoulder area at home, positioning her arms as they are for treatment, with support behind shoulders. She is applying Radiaplex to right breast treatment area for darkening of skin.

## 2013-06-02 NOTE — Progress Notes (Signed)
Dr. Ace Gins   Weekly Management Note:  Site: Right breast Current Dose:  3000 and  cGy Projected Dose: 5000  cGy, no boost  Narrative: The patient is seen today for routine under treatment assessment. CBCT/MVCT images/port films were reviewed. The chart was reviewed.   She still doing well except for right shoulder pain which she thinks may be related to a cervical disc. Her right shoulder is extended during her treatment. She takes ibuprofen when necessary. She has seen Dr. Arvilla Market in the past.  Physical Examination:  Filed Vitals:   06/02/13 1103  BP: 138/67  Pulse: 65  Temp: 98.6 F (37 C)  Resp: 20  .  Weight: 162 lb 3.2 oz (73.573 kg). There is mild hyperpigmentation the skin along the right breast. No areas of desquamation. Pain is described along her right shoulder. This is worse with right shoulder extension.  Impression: Tolerating radiation therapy well, however, she does have right shoulder discomfort which may be shoulder related or from a cervical disc. I told her to continue with ibuprofen when necessary, and to see if she can get in to see Dr. Ace Gins in the near future.  Plan: Continue radiation therapy as planned.

## 2013-06-03 ENCOUNTER — Telehealth: Payer: Self-pay | Admitting: Dietician

## 2013-06-03 ENCOUNTER — Ambulatory Visit
Admission: RE | Admit: 2013-06-03 | Discharge: 2013-06-03 | Disposition: A | Discharge status: Home / Self Care | Payer: Medicare PPO | Source: Ambulatory Visit | Attending: Radiation Oncology | Admitting: Radiation Oncology

## 2013-06-03 NOTE — Telephone Encounter (Signed)
Brief Outpatient Oncology Nutrition Note  Patient has been identified to be at risk on malnutrition screen.  Wt Readings from Last 10 Encounters:  06/02/13 162 lb 3.2 oz (73.573 kg)  05/26/13 162 lb 3.2 oz (73.573 kg)  05/19/13 160 lb 8 oz (72.802 kg)  04/17/13 161 lb (73.029 kg)  04/14/13 162 lb (73.483 kg)  04/08/13 162 lb 6.4 oz (73.664 kg)  03/26/13 165 lb 12.8 oz (75.206 kg)  03/14/13 166 lb (75.297 kg)  03/05/13 165 lb 6.4 oz (75.025 kg)  02/03/13 164 lb (74.39 kg)      Dx:  Stage 0 Breast Cancer undergoing XRT.  Patient of Dr. Humphrey Rolls.  Called patient secondary to weight loss.  Patient is eating well.  No concerns or questions at this time.  Patient to call for any nutrition related questions or concerns.  Antonieta Iba, RD, LDN

## 2013-06-04 ENCOUNTER — Ambulatory Visit
Admission: RE | Admit: 2013-06-04 | Discharge: 2013-06-04 | Disposition: A | Discharge status: Home / Self Care | Payer: Medicare PPO | Source: Ambulatory Visit | Attending: Radiation Oncology | Admitting: Radiation Oncology

## 2013-06-05 ENCOUNTER — Ambulatory Visit
Admission: RE | Admit: 2013-06-05 | Discharge: 2013-06-05 | Disposition: A | Discharge status: Home / Self Care | Payer: Medicare PPO | Source: Ambulatory Visit | Attending: Radiation Oncology | Admitting: Radiation Oncology

## 2013-06-06 ENCOUNTER — Ambulatory Visit
Admission: RE | Admit: 2013-06-06 | Discharge: 2013-06-06 | Disposition: A | Discharge status: Home / Self Care | Payer: Medicare PPO | Source: Ambulatory Visit | Attending: Radiation Oncology | Admitting: Radiation Oncology

## 2013-06-09 ENCOUNTER — Telehealth: Payer: Self-pay | Admitting: Neurology

## 2013-06-09 ENCOUNTER — Ambulatory Visit
Admission: RE | Admit: 2013-06-09 | Discharge: 2013-06-09 | Disposition: A | Discharge status: Home / Self Care | Payer: Medicare PPO | Source: Ambulatory Visit | Attending: Radiation Oncology | Admitting: Radiation Oncology

## 2013-06-09 ENCOUNTER — Encounter: Payer: Self-pay | Admitting: Radiation Oncology

## 2013-06-09 VITALS — BP 131/65 | HR 70 | Temp 99.0°F | Resp 20 | Wt 163.9 lb

## 2013-06-09 DIAGNOSIS — C50411 Malignant neoplasm of upper-outer quadrant of right female breast: Secondary | ICD-10-CM

## 2013-06-09 NOTE — Telephone Encounter (Signed)
The patient was last seen in February 2015, okay to work in Within the next 6-8 weeks.

## 2013-06-09 NOTE — Progress Notes (Signed)
Pt continues to have pain in her neck and upper right shoulder, arm. She states Dr Ace Gins no longer takes the pt's insurance. Pt will ask Dr Valere Dross for another referral name. Pt states she takes Motrin 800 mg once daily w/fair relief at times. Advised she may apply ice and/or heat prn as long as she does not apply it to right breast treatment area. Pt has darkening of skin of right breast treatment area, applying Radiaplex. She is fatigued, no loss of appetite.

## 2013-06-09 NOTE — Telephone Encounter (Signed)
Patient has appt w/Carolyn on 8-13--she is having problems with pain shooting from her neck to head--needs soon appt--wants to see Dr. Neta Mends call-thank you.

## 2013-06-09 NOTE — Telephone Encounter (Signed)
Spoke with patient and she said that the pain has been going on for the past two weeks,is currently taking radiation treatments, will complete on 06/16/13

## 2013-06-09 NOTE — Progress Notes (Signed)
Weekly Management Note:  Site: Right breast Current Dose:  4000  cGy Projected Dose: 5000  CGy, no boost  Narrative: The patient is seen today for routine under treatment assessment. CBCT/MVCT images/port films were reviewed. The chart was reviewed.   No new complaints today. She was not able to see Dr. Ace Gins because of her Medicare insurance change. She'll get a new referral through her primary care physician, Dr. Willey Blade.  She does have pain along her right cervical spine radiating to the shoulder. I believe that she has a cervical disc. She uses Radioplex gel.  Physical Examination:  Filed Vitals:   06/09/13 1112  BP: 131/65  Pulse: 70  Temp: 99 F (37.2 C)  Resp: 20  .  Weight: 163 lb 14.4 oz (74.345 kg). There is pain described along the right mid cervical spine. There is hyperpigmentation the skin along the right breast with no areas of desquamation.  Impression: Tolerating radiation therapy well.  Plan: Continue radiation therapy as planned.

## 2013-06-10 ENCOUNTER — Ambulatory Visit
Admission: RE | Admit: 2013-06-10 | Discharge: 2013-06-10 | Disposition: A | Discharge status: Home / Self Care | Payer: Medicare PPO | Source: Ambulatory Visit | Attending: Radiation Oncology | Admitting: Radiation Oncology

## 2013-06-10 ENCOUNTER — Encounter (INDEPENDENT_AMBULATORY_CARE_PROVIDER_SITE_OTHER): Payer: Self-pay

## 2013-06-10 ENCOUNTER — Encounter: Payer: Self-pay | Admitting: Neurology

## 2013-06-10 ENCOUNTER — Ambulatory Visit (INDEPENDENT_AMBULATORY_CARE_PROVIDER_SITE_OTHER): Payer: Medicare PPO | Admitting: Neurology

## 2013-06-10 VITALS — BP 138/77 | HR 80 | Wt 164.0 lb

## 2013-06-10 DIAGNOSIS — R42 Dizziness and giddiness: Secondary | ICD-10-CM

## 2013-06-10 DIAGNOSIS — M542 Cervicalgia: Secondary | ICD-10-CM

## 2013-06-10 DIAGNOSIS — S139XXA Sprain of joints and ligaments of unspecified parts of neck, initial encounter: Secondary | ICD-10-CM

## 2013-06-10 DIAGNOSIS — R51 Headache: Secondary | ICD-10-CM

## 2013-06-10 NOTE — Progress Notes (Signed)
Reason for visit: Headache  [name] is an 63 y.o. female  History of present illness:  [name] is a 63 year old right-handed black female with a history of migraine-type headache, and episodes of vertigo. 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 She had a lumpectomy as well. She has noted a two-month history of some right-sided headaches. She describes some pain and discomfort into the right shoulder, right neck, going up into the right side the head from occipital area to the frontotemporal region with pain behind the right eye. The patient reports a dull achy pain and some sharp shooting pains. She is suffering from chronic insomnia, and she will take Ambien for sleep. She does report some neck stiffness.  BC powders will be helpful her headaches on occasion. She has had an episode of vertigo that occurred one month ago, lasting 2 weeks. She does the Epley maneuvers which seem to help. She denied any nausea and vomiting with the vertigo, but she indicates that she believes that tramadol brought on the vertigo. She returns to this office for an evaluation.  Past Medical History  Diagnosis Date  . Hypercholesteremia     takes Crestor daily  . Vertigo     doesn't take any meds  . Neck pain 05/13/2012  . Hypertension     takes HCTZ daily  . Hypothyroid     takes Synthroid daily  . Insomnia     takes Ambien nightly as needed  . Headache(784.0)     occasionally  . Neuropathy   . Arthritis   . Constipation   . History of colon polyps   . Diverticulosis   . History of bladder infections   . History of blood transfusion     no abnormal reaction noted  . Breast cancer 02/19/13    right, 10 o'clock    Past Surgical History  Procedure Laterality Date  . Thyroid surgery    . Cholecystectomy    . Esophagogastroduodenoscopy  09/17/2008    RMR:Two mid esophageal diverticula/Small benign cystic mucosal lesions  distal esophagus of doubtful  clinical significance, stable for least 5 years/ Small hiatal hernia, otherwise normal stomach D1, D2  . Colonoscopy  09/17/2008    FIE:PPIR tortuous, but otherwise normal-appearing colon/. Scattered diverticula  . Esophagogastroduodenoscopy    02/10/2003    JJO:ACZYSA esophageal 3 cystic lesions without luminal compromise or evidence/Nonerosive antral gastritis/The esophagus was dilated by passing 56 [place] Maloney dilator.Marland Kitchen Epic notes states +H.pylori gastritis. treatment completed per epic notes.   . Eus  Aug 2011    Dr. Newman Pies: EGD with multiple lower esophageal submucosal nodules, stomach and duodenum normal, EUS with esophageal duplication cysts  . Esophagogastroduodenoscopy (egd) with esophageal dilation N/A 02/03/2013    YTK:ZSWFUX esophageal duplication cyst. Small esophageal diverticulum. Otherwise;  EGD normal - Widely patent tubular esophagus before and after dilation.  Status post passage of  a  Maloney dilator.  . Abdominal hysterectomy      partial  . Breast lumpectomy with needle localization and axillary sentinel lymph node bx Right 04/03/2013    Procedure: BREAST LUMPECTOMY WITH NEEDLE LOCALIZATION AND AXILLARY SENTINEL LYMPH NODE BX;  Surgeon: Merrie Roof, MD;  Location: [facility];  Service: General;  Laterality: Right;    Family History  Problem Relation Age of Onset  . Lupus Sister   . Hypertension Brother   . Diabetes Brother   . Hypertension Brother   .  Diabetes Brother   . Multiple sclerosis Mother   . Cancer Father   . Colon cancer Neg Hx     Social history:  reports that she has never smoked. She has never used smokeless tobacco. She reports that she does not drink alcohol or use illicit drugs.    Allergies  Allergen Reactions  . Codeine Nausea And Vomiting  . Percocet [Oxycodone-Acetaminophen] Rash    Medications:  Current Outpatient Prescriptions on File Prior to Visit  Medication Sig Dispense Refill  . aspirin EC 81 MG  tablet Take 81 mg by mouth daily.      . fluticasone (FLONASE) 50 MCG/ACT nasal spray Place 50 sprays into the nose daily as needed for allergies.       . hyaluronate sodium (RADIAPLEXRX) GEL Apply 1 application topically 2 (two) times daily.      . hydrochlorothiazide (HYDRODIURIL) 25 MG tablet Take 25 mg by mouth daily.        Marland Kitchen HYDROcodone-acetaminophen (NORCO/VICODIN) 5-325 MG per tablet Take 1-2 tablets by mouth every 4 (four) hours as needed.  50 tablet  0  . ibuprofen (ADVIL,MOTRIN) 800 MG tablet Take 800 mg by mouth every 8 (eight) hours as needed for fever, headache or mild pain.       Marland Kitchen levothyroxine (SYNTHROID, LEVOTHROID) 88 MCG tablet Take 88 mcg by mouth daily.        . rosuvastatin (CRESTOR) 5 MG tablet Take 5 mg by mouth daily.        Marland Kitchen zolpidem (AMBIEN) 10 MG tablet Take 10 mg by mouth at bedtime as needed for sleep.        No current facility-administered medications on file prior to visit.    ROS:  Out of a complete 14 system review of symptoms, the patient complains only of the following symptoms, and all other reviewed systems are negative.  Fatigue Eye pain Insomnia Back pain, neck pain Headache  Blood pressure 138/77, pulse 80, weight 164 lb (74.39 kg).  Physical Exam  General: The patient is alert and cooperative at the time of the examination.  Neuromuscular: Range of movement of the cervical spine is relatively normal.  Skin: No significant peripheral edema is noted.   Neurologic Exam  Mental status: The patient is oriented x 3.  Cranial nerves: Facial symmetry is present. Speech is normal, no aphasia or dysarthria is noted. Extraocular movements are full. Visual fields are full.  Motor: The patient has good strength in all 4 extremities.  Sensory examination: Soft touch sensation is symmetric on the face, arms, and legs.  Coordination: The patient has good finger-nose-finger and heel-to-shin bilaterally.  Gait and station: The patient has a  normal gait. Tandem gait is normal. Romberg is negative. No drift is seen.  Reflexes: Deep tendon reflexes are symmetric.   Assessment/Plan:  1. History of migraine  2. Right cervicogenic headache  3. Episodic vertigo  The patient likely has cervicogenic headache at this time. We will set her up for neuromuscular therapy of the cervical and shoulder area on the right. She will go back on gabapentin taking 100 mg twice daily. In the future, the use of muscle relaxants may be entertained. She will followup in 4 months.  Jill Alexanders MD 06/10/2013 11:02 AM  [facility] [address] [place], [place] [address]  Phone [phone] Fax [phone]

## 2013-06-10 NOTE — Patient Instructions (Signed)
Cervical Sprain A cervical sprain is an injury in the neck in which the strong, fibrous tissues (ligaments) that connect your neck bones stretch or tear. Cervical sprains can range from mild to severe. Severe cervical sprains can cause the neck vertebrae to be unstable. This can lead to damage of the spinal cord and can result in serious nervous system problems. The amount of time it takes for a cervical sprain to get better depends on the cause and extent of the injury. Most cervical sprains heal in 1 to 3 weeks. CAUSES  Severe cervical sprains may be caused by:   Contact sport injuries (such as from football, rugby, wrestling, hockey, auto racing, gymnastics, diving, martial arts, or boxing).   Motor vehicle collisions.   Whiplash injuries. This is an injury from a sudden forward-and backward whipping movement of the head and neck.  Falls.  Mild cervical sprains may be caused by:   Being in an awkward position, such as while cradling a telephone between your ear and shoulder.   Sitting in a chair that does not offer proper support.   Working at a poorly designed computer station.   Looking up or down for long periods of time.  SYMPTOMS   Pain, soreness, stiffness, or a burning sensation in the front, back, or sides of the neck. This discomfort may develop immediately after the injury or slowly, 24 hours or more after the injury.   Pain or tenderness directly in the middle of the back of the neck.   Shoulder or upper back pain.   Limited ability to move the neck.   Headache.   Dizziness.   Weakness, numbness, or tingling in the hands or arms.   Muscle spasms.   Difficulty swallowing or chewing.   Tenderness and swelling of the neck.  DIAGNOSIS  Most of the time your health care provider can diagnose a cervical sprain by taking your history and doing a physical exam. Your health care provider will ask about previous neck injuries and any known neck  problems, such as arthritis in the neck. X-rays may be taken to find out if there are any other problems, such as with the bones of the neck. Other tests, such as a CT scan or MRI, may also be needed.  TREATMENT  Treatment depends on the severity of the cervical sprain. Mild sprains can be treated with rest, keeping the neck in place (immobilization), and pain medicines. Severe cervical sprains are immediately immobilized. Further treatment is done to help with pain, muscle spasms, and other symptoms and may include:  Medicines, such as pain relievers, numbing medicines, or muscle relaxants.   Physical therapy. This may involve stretching exercises, strengthening exercises, and posture training. Exercises and improved posture can help stabilize the neck, strengthen muscles, and help stop symptoms from returning.  HOME CARE INSTRUCTIONS   Put ice on the injured area.   Put ice in a plastic bag.   Place a towel between your skin and the bag.   Leave the ice on for 15 20 minutes, 3 4 times a day.   If your injury was severe, you may have been given a cervical collar to wear. A cervical collar is a two-piece collar designed to keep your neck from moving while it heals.  Do not remove the collar unless instructed by your health care provider.  If you have long hair, keep it outside of the collar.  Ask your health care provider before making any adjustments to your collar.   Minor adjustments may be required over time to improve comfort and reduce pressure on your chin or on the back of your head.  Ifyou are allowed to remove the collar for cleaning or bathing, follow your health care provider's instructions on how to do so safely.  Keep your collar clean by wiping it with mild soap and water and drying it completely. If the collar you have been given includes removable pads, remove them every 1 2 days and hand wash them with soap and water. Allow them to air dry. They should be completely  dry before you wear them in the collar.  If you are allowed to remove the collar for cleaning and bathing, wash and dry the skin of your neck. Check your skin for irritation or sores. If you see any, tell your health care provider.  Do not drive while wearing the collar.   Only take over-the-counter or prescription medicines for pain, discomfort, or fever as directed by your health care provider.   Keep all follow-up appointments as directed by your health care provider.   Keep all physical therapy appointments as directed by your health care provider.   Make any needed adjustments to your workstation to promote good posture.   Avoid positions and activities that make your symptoms worse.   Warm up and stretch before being active to help prevent problems.  SEEK MEDICAL CARE IF:   Your pain is not controlled with medicine.   You are unable to decrease your pain medicine over time as planned.   Your activity level is not improving as expected.  SEEK IMMEDIATE MEDICAL CARE IF:   You develop any bleeding.  You develop stomach upset.  You have signs of an allergic reaction to your medicine.   Your symptoms get worse.   You develop new, unexplained symptoms.   You have numbness, tingling, weakness, or paralysis in any part of your body.  MAKE SURE YOU:   Understand these instructions.  Will watch your condition.  Will get help right away if you are not doing well or get worse. Document Released: 11/13/2006 Document Revised: 11/06/2012 Document Reviewed: 07/24/2012 Exit[employer] Patient Information 2014 [employer].  

## 2013-06-11 ENCOUNTER — Ambulatory Visit
Admission: RE | Admit: 2013-06-11 | Discharge: 2013-06-11 | Disposition: A | Discharge status: Home / Self Care | Payer: Medicare PPO | Source: Ambulatory Visit | Attending: Radiation Oncology | Admitting: Radiation Oncology

## 2013-06-12 ENCOUNTER — Ambulatory Visit
Admission: RE | Admit: 2013-06-12 | Discharge: 2013-06-12 | Disposition: A | Discharge status: Home / Self Care | Payer: Medicare PPO | Source: Ambulatory Visit | Attending: Radiation Oncology | Admitting: Radiation Oncology

## 2013-06-13 ENCOUNTER — Ambulatory Visit
Admission: RE | Admit: 2013-06-13 | Discharge: 2013-06-13 | Disposition: A | Discharge status: Home / Self Care | Payer: Medicare PPO | Source: Ambulatory Visit | Attending: Radiation Oncology | Admitting: Radiation Oncology

## 2013-06-16 ENCOUNTER — Ambulatory Visit
Admission: RE | Admit: 2013-06-16 | Discharge: 2013-06-16 | Disposition: A | Discharge status: Home / Self Care | Payer: Medicare PPO | Source: Ambulatory Visit | Attending: Radiation Oncology | Admitting: Radiation Oncology

## 2013-06-16 ENCOUNTER — Encounter (INDEPENDENT_AMBULATORY_CARE_PROVIDER_SITE_OTHER): Payer: Medicare PPO | Admitting: General Surgery

## 2013-06-16 ENCOUNTER — Ambulatory Visit (INDEPENDENT_AMBULATORY_CARE_PROVIDER_SITE_OTHER): Payer: Medicare PPO | Admitting: General Surgery

## 2013-06-16 ENCOUNTER — Encounter (INDEPENDENT_AMBULATORY_CARE_PROVIDER_SITE_OTHER): Payer: Self-pay | Admitting: General Surgery

## 2013-06-16 VITALS — BP 123/63 | HR 69 | Temp 98.6°F | Ht 65.0 in | Wt 163.3 lb

## 2013-06-16 VITALS — BP 118/70 | HR 80 | Temp 97.6°F | Resp 14 | Wt 162.6 lb

## 2013-06-16 DIAGNOSIS — D0590 Unspecified type of carcinoma in situ of unspecified breast: Secondary | ICD-10-CM

## 2013-06-16 DIAGNOSIS — D0510 Intraductal carcinoma in situ of unspecified breast: Secondary | ICD-10-CM

## 2013-06-16 DIAGNOSIS — C50411 Malignant neoplasm of upper-outer quadrant of right female breast: Secondary | ICD-10-CM

## 2013-06-16 MED ORDER — RADIAPLEXRX EX GEL
Freq: Once | CUTANEOUS | Status: AC
  Administered 2013-06-16: 11:00:00 via TOPICAL

## 2013-06-16 NOTE — Progress Notes (Signed)
[name] has completed treatment with 25 fractions to her right breast.  She denies pain.  She reports her fatigue has improved.  The skin on her right breast has hyperpigmentation with peeling underneath her right breast.  She is using radiaplex gel and has requested a refill. Another tube has been given.

## 2013-06-16 NOTE — Progress Notes (Signed)
Weekly Management Note:  Site: Right breast Current Dose:  5000  cGy Projected Dose: 5000  cGy  Narrative: The patient is seen today for routine under treatment assessment. CBCT/MVCT images/port films were reviewed. The chart was reviewed.   Ration therapy is completed today. She is without new complaints. She uses Radioplex gel. She was started on gabapentin by Dr. Jannifer Franklin of neurology.  Physical Examination:  Filed Vitals:   06/16/13 1046  BP: 123/63  Pulse: 69  Temp: 98.6 F (37 C)  .  Weight: 163 lb 4.8 oz (74.072 kg). There is marked hyperpigmentation the skin along the right breast with dry desquamation but no areas of moist desquamation.  Impression: Radiation therapy well tolerated. Treatment is completed.  Plan: Followup visit with me in one month.

## 2013-06-16 NOTE — Progress Notes (Signed)
Subjective:     Patient ID: [name], female   DOB: 1950-03-19, 63 y.o.   MRN: [id number]  HPI The patient is a 65 black female who is 2 months status post right lumpectomy and negative sentinel node biopsy for DCIS. She is ER and PR negative. She finished radiation therapy today. She has had some occasional pains that come and go in the right breast. Otherwise she seems to be doing well.  Review of Systems  Constitutional: Negative.   HENT: Negative.   Eyes: Negative.   Respiratory: Negative.   Cardiovascular: Negative.   Gastrointestinal: Negative.   Endocrine: Negative.   Genitourinary: Negative.   Musculoskeletal: Negative.   Skin: Negative.   Allergic/Immunologic: Negative.   Neurological: Negative.   Hematological: Negative.   Psychiatric/Behavioral: Negative.        Objective:   Physical Exam  Constitutional: She is oriented to person, place, and time. She appears well-developed and well-nourished.  HENT:  Head: Normocephalic and atraumatic.  Eyes: Conjunctivae and EOM are normal. Pupils are equal, round, and reactive to light.  Neck: Normal range of motion. Neck supple.  Cardiovascular: Normal rate, regular rhythm and normal heart sounds.   Pulmonary/Chest: Effort normal and breath sounds normal.  Her right breast and axillary incisions are healing nicely. She still has some radiation changes to the skin. There is no palpable mass in either breast. She does have some sebaceous cysts along her sternal area which did not appear infected  Abdominal: Soft. Bowel sounds are normal.  Musculoskeletal: Normal range of motion.  Lymphadenopathy:    She has no cervical adenopathy.  Neurological: She is alert and oriented to person, place, and time.  Skin: Skin is warm and dry.  Psychiatric: She has a normal mood and affect. Her behavior is normal.       Assessment:     The patient is 2 months status post right lumpectomy for DCIS     Plan:     At this point she  will continue to do regular self exams. I will plan to see her back in 3 months.

## 2013-06-16 NOTE — Patient Instructions (Signed)
Continue regular self exams  

## 2013-06-17 ENCOUNTER — Encounter: Payer: Self-pay | Admitting: Radiation Oncology

## 2013-06-17 NOTE — Progress Notes (Signed)
[facility] End of Treatment Note  Name:[name]  Date: 06/17/2013 XBM:[id number] DOB:[birth year]   Status:outpatient    CC: Asencion Noble, MD  Dr. Autumn Messing III  REFERRING PHYSICIAN:   Dr. 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 Theola tolerated treatment well with marked hyperpigmentation the skin and dry desquamation during her course of therapy. She used Radioplex gel.                         PLAN: Routine followup in one month. Patient instructed to call if questions or worsening complaints in interim.

## 2013-06-26 ENCOUNTER — Telehealth: Payer: Self-pay | Admitting: *Deleted

## 2013-06-26 NOTE — Telephone Encounter (Signed)
Pt completed treatment to her right breast on 06/16/13. She states she has noticed increased sensitivity of her right nipple since completing treatment. She denies other problems with her nipple or breast. She is applying Radiaplex twice daily to her right breast. Informed patient that this is most likely the normal process of her skin healing and that she should begin to notice improvement in the next few days. Patient verbalized understanding. This RN will FU with patient next week.

## 2013-06-30 ENCOUNTER — Telehealth: Payer: Self-pay | Admitting: *Deleted

## 2013-06-30 NOTE — Telephone Encounter (Signed)
Called patient re: follow up from her call last week. Pt states on Friday she noticed a big difference in her right breast and nipple sensitivity. She states "it is getting better". She continues to apply lotion to breast.

## 2013-07-10 ENCOUNTER — Ambulatory Visit (HOSPITAL_COMMUNITY)
Admission: RE | Admit: 2013-07-10 | Discharge: 2013-07-10 | Disposition: A | Discharge status: Home / Self Care | Payer: Medicare PPO | Source: Ambulatory Visit | Attending: Neurology | Admitting: Neurology

## 2013-07-10 DIAGNOSIS — M542 Cervicalgia: Secondary | ICD-10-CM | POA: Insufficient documentation

## 2013-07-10 DIAGNOSIS — M2560 Stiffness of unspecified joint, not elsewhere classified: Secondary | ICD-10-CM

## 2013-07-10 DIAGNOSIS — E785 Hyperlipidemia, unspecified: Secondary | ICD-10-CM | POA: Diagnosis not present

## 2013-07-10 DIAGNOSIS — R51 Headache: Secondary | ICD-10-CM | POA: Diagnosis not present

## 2013-07-10 DIAGNOSIS — IMO0001 Reserved for inherently not codable concepts without codable children: Secondary | ICD-10-CM | POA: Diagnosis not present

## 2013-07-10 DIAGNOSIS — M539 Dorsopathy, unspecified: Secondary | ICD-10-CM | POA: Diagnosis not present

## 2013-07-10 DIAGNOSIS — M6281 Muscle weakness (generalized): Secondary | ICD-10-CM | POA: Insufficient documentation

## 2013-07-10 DIAGNOSIS — S139XXA Sprain of joints and ligaments of unspecified parts of neck, initial encounter: Secondary | ICD-10-CM

## 2013-07-10 DIAGNOSIS — I10 Essential (primary) hypertension: Secondary | ICD-10-CM | POA: Insufficient documentation

## 2013-07-10 DIAGNOSIS — M2569 Stiffness of other specified joint, not elsewhere classified: Secondary | ICD-10-CM

## 2013-07-10 NOTE — Evaluation (Addendum)
Physical Therapy Evaluation  Patient Details  Name: [name] MRN: [id number] Date of Birth: 11/22/1950  Today's Date: 07/10/2013 Time: 0930-1015 PT Time Calculation (min): 45 min     Charges: 1 Evaluation, 10 minutes (1005-1015) TherEx         Visit#: 1 of 16  Re-eval: 08/09/13 Assessment Diagnosis: neck pain and headaches secondary to limited mobility and weakness Next MD Visit: Margette Fast in September, and Radiologist, 25th of June Prior Therapy: no  Authorization: [employer]    Authorization Time Period:    Authorization Visit#: 1 of     Past Medical History:  Past Medical History  Diagnosis Date  . Hypercholesteremia     takes Crestor daily  . Vertigo     doesn't take any meds  . Neck pain 05/13/2012  . Hypertension     takes HCTZ daily  . Hypothyroid     takes Synthroid daily  . Insomnia     takes Ambien nightly as needed  . Headache(784.0)     occasionally  . Neuropathy   . Arthritis   . Constipation   . History of colon polyps   . Diverticulosis   . History of bladder infections   . History of blood transfusion     no abnormal reaction noted  . Breast cancer 02/19/13    right, 10 o'clock   Past Surgical History:  Past Surgical History  Procedure Laterality Date  . Thyroid surgery    . Cholecystectomy    . Esophagogastroduodenoscopy  09/17/2008    RMR:Two mid esophageal diverticula/Small benign cystic mucosal lesions distal esophagus of doubtful  clinical significance, stable for least 5 years/ Small hiatal hernia, otherwise normal stomach D1, D2  . Colonoscopy  09/17/2008    KGU:RKYH tortuous, but otherwise normal-appearing colon/. Scattered diverticula  . Esophagogastroduodenoscopy    02/10/2003    CWC:BJSEGB esophageal 3 cystic lesions without luminal compromise or evidence/Nonerosive antral gastritis/The esophagus was dilated by passing 56 [place] Maloney dilator.Marland Kitchen Epic notes states +H.pylori gastritis. treatment completed per epic notes.    . Eus  Aug 2011    Dr. Newman Pies: EGD with multiple lower esophageal submucosal nodules, stomach and duodenum normal, EUS with esophageal duplication cysts  . Esophagogastroduodenoscopy (egd) with esophageal dilation N/A 02/03/2013    TDV:VOHYWV esophageal duplication cyst. Small esophageal diverticulum. Otherwise;  EGD normal - Widely patent tubular esophagus before and after dilation.  Status post passage of  a  Maloney dilator.  . Abdominal hysterectomy      partial  . Breast lumpectomy with needle localization and axillary sentinel lymph node bx Right 04/03/2013    Procedure: BREAST LUMPECTOMY WITH NEEDLE LOCALIZATION AND AXILLARY SENTINEL LYMPH NODE BX;  Surgeon: Luella Cook III, MD;  Location: [place];  Service: General;  Laterality: Right;    Subjective Symptoms/Limitations Symptoms: pain in Rt neck, noradiatign symptoms, head ache Pertinent History: patient p/c of Rt neck pain and head aches for at least 2 months. Patient states pain worsens with Sleeping on Rt side and using Rt arm, patient is right handed, history of Rt breast cancer (last treatment 1 month ago), neck pain began for treatment for breast cancer, history of high thyroid (partial thyroid ectomy performed to treat). ,  Limitations: Standing;Sitting;Walking How long can you stand comfortably?: <3 hours when performijg house cleaning Patient Stated Goals: decrease neck pain Pain Assessment Currently in Pain?: Yes Pain Score: 5  Pain Location: Neck Pain Orientation: Right;Mid Pain Type: Chronic pain Pain Onset: More  than a month ago Pain Frequency: Constant Pain Relieving Factors: 1a tylenol cream, hot pack Effect of Pain on Daily Activities: difficulty walking, standing and sittign for plronged time, difficulty looking over Rt shouldwer while driving.   Precautions/Restrictions  Precautions Precautions: None  Balance Screening Balance Screen Has the patient fallen in the past 6 months:  No  Cognition/Observation Observation/Other Assessments Observations: Gait: excessive knee varus Other Assessments: 3D hip excursion: limited Lt rotation, pain with Rt sided hio frotnal plane excursion  Sensation/Coordination/Flexibility/Functional Tests Functional Tests Functional Tests: Posture: forward head, forward rounded shoulders,   Assessment Cervical AROM Overall Cervical AROM Comments: unable to assess specifc joint mobility this session due to pain.  Cervical Flexion: 50 degrees, pain at 32 degrees Cervical Extension: 30 degrees Cervical - Right Side Bend: 39 (more pain than to the Lf) Cervical - Left Side Bend: 45 Cervical - Right Rotation: 60 Cervical - Left Rotation: 80 Cervical Strength Cervical Flexion: 2+/5 Cervical Extension: 2+/5 Cervical - Right Side Bend: 2+/5 Cervical - Left Side Bend: 3/5 Cervical - Right Rotation: 2+/5 Cervical - Left Rotation: 3+/5 Palpation Palpation: Significant tenderness and mobility limitation in upper trapezius, levator, paraspinals and suboccipitals  Exercise/Treatments Stretches Other Neck Stretches: 3D neck stretch 5x 10seconds  Manual Therapy Manual Therapy: Myofascial release Myofascial Release: Rt upper trapezius, scalenes, Levator,   Physical Therapy Assessment and Plan PT Assessment and Plan Clinical Impression Statement: Patientis a pleasant lady that displays signs and symptoms consistent with Rt neck muscle sprain resulting in limited muscle strangth and decreased mobility. Patient will benefit from skilled phsycial therapy to address the above listed limitations and help patient retrun to prior level of fuction so she can turn her head to check blind spot while drivign and tolerate standing for greater than 2 hours.    Pt will benefit from skilled therapeutic intervention in order to improve on the following deficits: Decreased activity tolerance;Decreased strength;Decreased range of motion;Increased fascial  restricitons Rehab Potential: Good Clinical Impairments Affecting Rehab Potential: patient is highly motivated and previously had no pain.  PT Frequency: Min 2X/week PT Duration: 8 weeks PT Treatment/Interventions: Therapeutic exercise;Manual techniques;Modalities;Patient/family education PT Plan: Initial therapy to focus on decreasing pain and improving neck and shoulder mobility. As pain and mobiklity improves focus will shift to increased cervical and thoracic spine mobility    Goals PT Short Term Goals Time to Complete Short Term Goals: 4 weeks PT Short Term Goal 1: patient will be able to rotate head >80 degrees bilaterally to check blind spot while driving. PT Short Term Goal 2: Patient's neck pain will be less than 2/10 folloring 4 hours of working in home PT Short Term Goal 3: Patient will be able to flex neck through full ROM without pain to be able to look down at here she's stepping when walking.  PT Long Term Goals Time to Complete Long Term Goals: 8 weeks PT Long Term Goal 1: Patient will  report less that 2 head ache occurances per day PT Long Term Goal 2: Patient's neck pain will be less than 2/10 follwoing a full day (>8hours) of workign around her home Long Term Goal 3: Patient will be able to perform supine deep neck flexion test >60 seconds indicating improved heck sability and decreased reliabilioty of neck extensors for improved posture.   Problem List Patient Active Problem List   Diagnosis Date Noted  . Sprain of neck 07/10/2013  . Stiffness of joint, not elsewhere classified, other specified site 07/10/2013  . Breast cancer   .  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 Breast cancer of upper-outer quadrant of right female breast 02/24/2013  . Unspecified constipation 01/22/2013  . Dyspepsia 01/22/2013  . Vertigo 05/13/2012  . Headache 05/13/2012  . Neck pain 05/13/2012  . Menopausal symptoms   . Abscess 06/14/2011  . DYSPHAGIA UNSPECIFIED  09/14/2008  . MENOPAUSE-RELATED VASOMOTOR SYMPTOMS, HOT FLASHES 03/25/2007  . HYPERTHYROIDISM 10/22/2006  . HYPERLIPIDEMIA 10/22/2006  . HYPERTENSION 10/22/2006  . OVERACTIVE BLADDER 10/22/2006  . FIBROCYSTIC BREAST DISEASE 10/22/2006  . SWEATING 10/22/2006  . INSOMNIA, HX OF 10/22/2006    PT - End of Session Activity Tolerance: Patient tolerated treatment well PT Plan of Care PT Home Exercise Plan: 3D neck stretch 5x 10seconds  GP   Functional Assessment Tool Used: clinical judgement Functional Limitation: Mobility: Walking and moving around Mobility: Walking and Moving Around Current Status (L3810): At least 60 percent but less than 80 percent impaired, limited or restricted Mobility: Walking and Moving Around Goal Status [phone]): At least 40 percent but less than 60 percent impaired, limited or restricted  Dezarae Mcclaran R PT DPT 07/10/2013, 1:02 PM  Physician Documentation Your signature is required to indicate approval of the treatment plan as stated above.  Please sign and either send electronically or make a copy of this report for your files and return this physician signed original.   Please mark one 1.__approve of plan  2. ___approve of plan with the following conditions.   ______________________________                                                          _____________________ Physician Signature                                                                                                             Date

## 2013-07-16 ENCOUNTER — Ambulatory Visit (HOSPITAL_COMMUNITY)
Admission: RE | Admit: 2013-07-16 | Discharge: 2013-07-16 | Disposition: A | Discharge status: Home / Self Care | Payer: Medicare PPO | Source: Ambulatory Visit | Attending: Neurology | Admitting: Neurology

## 2013-07-16 DIAGNOSIS — IMO0001 Reserved for inherently not codable concepts without codable children: Secondary | ICD-10-CM | POA: Diagnosis not present

## 2013-07-16 NOTE — Progress Notes (Signed)
Physical Therapy Treatment Patient Details  Name: [name] MRN: [id number] Date of Birth: 25-May-1950  Today's Date: 07/16/2013 Time: 1320-1350 PT Time Calculation (min): 30 min  Visit#: 2 of 6  Re-eval: 08/09/13 Assessment Diagnosis: neck pain and headaches secondary to limited mobility and weakness Next MD Visit: Margette Fast in September, and Radiologist, 25th of June Prior Therapy: no Authorization: [employer]; 6 visits approved until 08/24/13  Authorization Visit#: 2 of 6  Charges:  therex 1320-1340 (20'), manual 1340-1350 (10')  Subjective: Symptoms/Limitations Symptoms: Pt stated she had a fit yesterday, believes related to her pillow.  Pt states compliance with HEP without question.  No current headache, has taken 800mg  Ibuprofin Pain Assessment Currently in Pain?: Yes Pain Score: 5  Pain Location: Neck Pain Orientation: Right  Precautions/Restrictions  Precautions Precautions: Other (comment) Precaution Comments: Cancer  Exercise/Treatments Stretches Upper Trapezius Stretch: 2 reps;30 seconds Other Neck Stretches: 3D neck stretch 10x 10seconds   Session began by Ihor Austin, PTA who led through therex 1320-1340 (20')  Physical Therapy Assessment and Plan PT Assessment :  Pt required therapist facilitation to perform stretches in correct form.  No complaint of pain or discomfort voiced with stretches.  Soft tissue massage with focus on Lt upper trap reduced pain and spasm.  Pt reported painfree at end of session.   PT Treatment/Interventions: Therapeutic exercise;Manual techniques;Modalities;Patient/family education PT Plan: Initial therapy to focus on decreasing pain and improving neck and shoulder mobility. As pain and mobiklity improves focus will shift to increased cervical and thoracic spine mobility    Goals PT Short Term Goals PT Short Term Goal 1 - Progress: Progressing toward goal PT Short Term Goal 3 - Progress: Progressing toward  goal  Problem List Patient Active Problem List   Diagnosis Date Noted  . Sprain of neck 07/10/2013  . Stiffness of joint, not elsewhere classified, other specified site 07/10/2013  . Breast cancer   . 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 Breast cancer of upper-outer quadrant of right female breast 02/24/2013  . Unspecified constipation 01/22/2013  . Dyspepsia 01/22/2013  . Vertigo 05/13/2012  . Headache 05/13/2012  . Neck pain 05/13/2012  . Menopausal symptoms   . Abscess 06/14/2011  . DYSPHAGIA UNSPECIFIED 09/14/2008  . MENOPAUSE-RELATED VASOMOTOR SYMPTOMS, HOT FLASHES 03/25/2007  . HYPERTHYROIDISM 10/22/2006  . HYPERLIPIDEMIA 10/22/2006  . HYPERTENSION 10/22/2006  . OVERACTIVE BLADDER 10/22/2006  . FIBROCYSTIC BREAST DISEASE 10/22/2006  . SWEATING 10/22/2006  . INSOMNIA, HX OF 10/22/2006    PT - End of Session Activity Tolerance: Patient tolerated treatment well  GP    Teena Irani, PTA/CLT 07/16/2013, 5:06 PM

## 2013-07-21 ENCOUNTER — Ambulatory Visit (HOSPITAL_COMMUNITY)
Admission: RE | Admit: 2013-07-21 | Discharge: 2013-07-21 | Disposition: A | Discharge status: Home / Self Care | Payer: Medicare PPO | Source: Ambulatory Visit | Attending: Neurology | Admitting: Neurology

## 2013-07-21 DIAGNOSIS — IMO0001 Reserved for inherently not codable concepts without codable children: Secondary | ICD-10-CM | POA: Diagnosis not present

## 2013-07-21 NOTE — Progress Notes (Signed)
Physical Therapy Treatment Patient Details  Name: [name] MRN: [id number] Date of Birth: 1950/10/12  Today's Date: 07/21/2013 Time: 1740-8144 PT Time Calculation (min): 50 min Charge TE [id number], Manual [phone]  Visit#: 3 of 16  Re-eval: 08/09/13 Assessment Diagnosis: neck pain and headaches secondary to limited mobility and weakness Next MD Visit: Margette Fast in September, and Radiologist, 25th of June Prior Therapy: no  Authorization: [employer]; 6 visits approved until 08/24/13  Authorization Time Period:    Authorization Visit#: 3 of 6   Subjective: Symptoms/Limitations Symptoms: Pt stated feeling better, takes Ibuprofen in morning which assists with headaches.  Compliant with HEP without quesion.  Current pain scale 4/10 no heaches. Pain Assessment Currently in Pain?: Yes Pain Score: 4  Pain Location: Neck Pain Orientation: Right  Precautions/Restrictions  Precautions Precautions: Other (comment) Precaution Comments: Cancer  Exercise/Treatments Stretches Upper Trapezius Stretch: 2 reps;30 seconds Levator Stretch: 2 reps;30 seconds Other Neck Stretches: 3D neck stretch 10x 10seconds Seated Exercises Cervical Isometrics: Flexion;Extension;Right lateral flexion;Left lateral flexion;Right rotation;Left rotation;5 secs;5 reps Cervical Rotation: Both;10 reps;Limitations Cervical Rotation Limitations: 3D cervical excursion Lateral Flexion: Both;10 reps;Limitations Lateral Flexion Limitations: 3D cervical excursion Other Seated Exercise: flex/extend 3D cervical excursion  Manual Therapy Manual Therapy: Myofascial release Myofascial Release: Rt upper trapezius, scalenes, Levator  Physical Therapy Assessment and Plan PT Assessment and Plan Clinical Impression Statement: Added isometric to begin cervical strengthening.  Continued with 3D cervical excursion and stretches to improve mobilty and flexibilty, noted improved Rt cervical rotation.  Manual  techniques complete to reduce fascial restrictions and spams Rt scalences and upper traiezoius, pt very tender Rt suboccipital foramen PT Plan: Initial stretches to focus on decreasing pain and improving neck and shoulder mobility. As pain and mobiklity improves focus will shift to increased cervical and thoracic spine mobility    Goals PT Short Term Goals PT Short Term Goal 1: patient will be able to rotate head >80 degrees bilaterally to check blind spot while driving. PT Short Term Goal 1 - Progress: Progressing toward goal PT Short Term Goal 2: Patient's neck pain will be less than 2/10 folloring 4 hours of working in home PT Short Term Goal 3: Patient will be able to flex neck through full ROM without pain to be able to look down at here she's stepping when walking.  PT Short Term Goal 3 - Progress: Progressing toward goal PT Long Term Goals PT Long Term Goal 1: Patient will  report less that 2 head ache occurances per day PT Long Term Goal 2: Patient's neck pain will be less than 2/10 follwoing a full day (>8hours) of workign around her home Long Term Goal 3: Patient will be able to perform supine deep neck flexion test >60 seconds indicating improved heck sability and decreased reliabilioty of neck extensors for improved posture.   Problem List Patient Active Problem List   Diagnosis Date Noted  . Sprain of neck 07/10/2013  . Stiffness of joint, not elsewhere classified, other specified site 07/10/2013  . Breast cancer   . 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 Breast cancer of upper-outer quadrant of right female breast 02/24/2013  . Unspecified constipation 01/22/2013  . Dyspepsia 01/22/2013  . Vertigo 05/13/2012  . Headache 05/13/2012  . Neck pain 05/13/2012  . Menopausal symptoms   . Abscess 06/14/2011  . DYSPHAGIA UNSPECIFIED 09/14/2008  . MENOPAUSE-RELATED VASOMOTOR SYMPTOMS, HOT FLASHES 03/25/2007  . HYPERTHYROIDISM 10/22/2006  . HYPERLIPIDEMIA  10/22/2006  . HYPERTENSION 10/22/2006  .  OVERACTIVE BLADDER 10/22/2006  . FIBROCYSTIC BREAST DISEASE 10/22/2006  . SWEATING 10/22/2006  . INSOMNIA, HX OF 10/22/2006    PT - End of Session Activity Tolerance: Patient tolerated treatment well General Behavior During Therapy: [facility] for tasks assessed/performed  GP    Aldona Lento 07/21/2013, 10:06 AM

## 2013-07-22 ENCOUNTER — Encounter: Payer: Self-pay | Admitting: *Deleted

## 2013-07-23 ENCOUNTER — Ambulatory Visit (HOSPITAL_COMMUNITY)
Admission: RE | Admit: 2013-07-23 | Discharge: 2013-07-23 | Disposition: A | Discharge status: Home / Self Care | Payer: Medicare PPO | Source: Ambulatory Visit | Attending: Neurology | Admitting: Neurology

## 2013-07-23 DIAGNOSIS — IMO0001 Reserved for inherently not codable concepts without codable children: Secondary | ICD-10-CM | POA: Diagnosis not present

## 2013-07-23 NOTE — Progress Notes (Signed)
Physical Therapy Treatment Patient Details  Name: [name] MRN: [id number] Date of Birth: [birth year]  Today's Date: 07/23/2013 Time: 0850-0930 PT Time Calculation (min): 40 min   Charges: manual 850-900, TherEx 900-930  Visit#: 4 of 16  Re-eval: 08/09/13 Assessment Diagnosis: neck pain and headaches secondary to limited mobility and weakness Next MD Visit: Margette Fast in September, and Radiologist, 25th of June Prior Therapy: no  Authorization: [employer]; 6 visits approved until 08/24/13  Authorization Time Period:    Authorization Visit#: 4 of 6   Subjective: Symptoms/Limitations Symptoms: Patient arrives with continued complaint of head aches with patient noting that she always feelws bad when she wakes up in the mornig with her neck feeling stiffer.  Pain Assessment Currently in Pain?: Yes Pain Score: 5   Precautions/Restrictions     Exercise/Treatments Stretches Upper Trapezius Stretch: 2 reps;30 seconds Levator Stretch: 2 reps;30 seconds Other Neck Stretches: 3D neck stretch 10x 3seconds Theraband Exercises Rows: Red;10 reps Standing Exercises Neck Retraction: 10 reps;3 secs Seated Exercises Cervical Isometrics: Flexion;Extension;Right lateral flexion;Left lateral flexion;Right rotation;Left rotation;5 secs;5 reps Cervical Rotation: Both;10 reps;Limitations Cervical Rotation Limitations: 3D cervical excursion Lateral Flexion: Both;10 reps;Limitations Lateral Flexion Limitations: 3D cervical excursion Other Seated Exercise: flex/extend 3D cervical excursion  Manual Therapy Myofascial Release: Rt upper trapezius, scalenes, Levator and bilateral suboccipital release  Physical Therapy Assessment and Plan PT Assessment and Plan Clinical Impression Statement: This session contineud focus on improving pain with conitnug introduction of low level strengtheing exercises within pain free limits. Patient noted improved pain following manual therapy and  further imrpoveemnt in pain follwoign stretches and strengtheing exercises.  PT Plan: Continue stretches to focus on decreasing pain and improving neck and shoulder mobility. As pain and mobillity improves focus will shift to increased cervical and thoracic spine mobility. Introduce UBE next session for 5 minutes. Medicare only authoraized 6 visits. patient will require additional visits to reach all short term and  long term goals.     Goals PT Short Term Goals PT Short Term Goal 1: patient will be able to rotate head >80 degrees bilaterally to check blind spot while driving. PT Short Term Goal 1 - Progress: Progressing toward goal PT Short Term Goal 2: Patient's neck pain will be less than 2/10 folloring 4 hours of working in home PT Short Term Goal 2 - Progress: Progressing toward goal PT Short Term Goal 3: Patient will be able to flex neck through full ROM without pain to be able to look down at here she's stepping when walking.  PT Short Term Goal 3 - Progress: Progressing toward goal  Problem List Patient Active Problem List   Diagnosis Date Noted  . Sprain of neck 07/10/2013  . Stiffness of joint, not elsewhere classified, other specified site 07/10/2013  . Breast cancer   . 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 Breast cancer of upper-outer quadrant of right female breast 02/24/2013  . Unspecified constipation 01/22/2013  . Dyspepsia 01/22/2013  . Vertigo 05/13/2012  . Headache 05/13/2012  . Neck pain 05/13/2012  . Menopausal symptoms   . Abscess 06/14/2011  . DYSPHAGIA UNSPECIFIED 09/14/2008  . MENOPAUSE-RELATED VASOMOTOR SYMPTOMS, HOT FLASHES 03/25/2007  . HYPERTHYROIDISM 10/22/2006  . HYPERLIPIDEMIA 10/22/2006  . HYPERTENSION 10/22/2006  . OVERACTIVE BLADDER 10/22/2006  . FIBROCYSTIC BREAST DISEASE 10/22/2006  . SWEATING 10/22/2006  . INSOMNIA, HX OF 10/22/2006    PT - End of Session Activity Tolerance: Patient tolerated treatment  well General Behavior During Therapy: [facility]  for tasks assessed/performed  GP    De[place]itt, Cash R 07/23/2013, 9:30 AM

## 2013-07-24 ENCOUNTER — Ambulatory Visit
Admission: RE | Admit: 2013-07-24 | Discharge: 2013-07-24 | Disposition: A | Discharge status: Home / Self Care | Payer: Medicare PPO | Source: Ambulatory Visit | Attending: Radiation Oncology | Admitting: Radiation Oncology

## 2013-07-24 ENCOUNTER — Encounter: Payer: Self-pay | Admitting: Radiation Oncology

## 2013-07-24 VITALS — BP 137/64 | HR 70 | Temp 98.4°F | Resp 20 | Wt 168.0 lb

## 2013-07-24 DIAGNOSIS — C50411 Malignant neoplasm of upper-outer quadrant of right female breast: Secondary | ICD-10-CM

## 2013-07-24 NOTE — Progress Notes (Signed)
Followup note:  Ms. Evanne returns today approximately 1 month following completion of radiation therapy following conservative surgery in the management of her high-grade DCIS of the right breast. She is without new complaints today. Her right breast discomfort is much improved. She has a medical oncology followup on September 16. Her DCIS was hormone receptor negative and she is not on any adjuvant hormone therapy. She sees Dr. Marlou Starks later this summer.  Physical examination: Alert and oriented. Filed Vitals:   07/24/13 1258  BP: 137/64  Pulse: 70  Temp: 98.4 F (36.9 C)  Resp: 20   Nodes: There is no palpable cervical, supraclavicular, or axillary lymphadenopathy.: There is residual hyperpigmentation the skin along the right breast. There is mild to moderate thickening of the right breast. No dominant masses are appreciated. Left breast without masses or lesions. Extremities: Without edema.  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 I think she can have a baseline mammogram in January of 2016 when she has a screening mammogram on the left. She'll visit medical oncology in September and see Dr. Marlou Starks later this summer.  Plan: As above. I've not scheduled the patient for a formal followup visit, but I would be more than happy to see her in the future should the need arise.

## 2013-07-24 NOTE — Progress Notes (Signed)
Patient denies pain but states her right breast is "still tender but not as bad as it was". She denies fatigue, loss of appetite. She has been taking PT for her neck pain with some improvement. Pt states her skin of right breast is "completely healed".

## 2013-07-29 ENCOUNTER — Ambulatory Visit (HOSPITAL_COMMUNITY)
Admission: RE | Admit: 2013-07-29 | Discharge: 2013-07-29 | Disposition: A | Discharge status: Home / Self Care | Payer: Medicare PPO | Source: Ambulatory Visit | Attending: Neurology | Admitting: Neurology

## 2013-07-29 DIAGNOSIS — IMO0001 Reserved for inherently not codable concepts without codable children: Secondary | ICD-10-CM | POA: Diagnosis not present

## 2013-07-29 NOTE — Progress Notes (Signed)
Physical Therapy Treatment Patient Details  Name: [name] MRN: [id number] Date of Birth: 1950-11-21  Today's Date: 07/29/2013 Time: 1950-9326 PT Time Calculation (min): 43 min  Visit#: 5 of 6  Re-eval: 07/31/13 Charges:  therex [phone] (30'), manual [phone] (10') Authorization: [employer]; 6 visits approved until 08/24/13  Authorization Visit#: 5 of 6   Subjective: Symptoms/Limitations Symptoms: Pt states she's not hurting as bad today. Currently with 3/10 pain in Rt side of cervical region. Pain Assessment Currently in Pain?: Yes Pain Score: 3  Pain Location: Neck Pain Orientation: Right   Exercise/Treatments Stretches Upper Trapezius Stretch: 2 reps;30 seconds Levator Stretch: 2 reps;30 seconds Other Neck Stretches: 3D neck stretch 10x 3seconds Machines for Strengthening UBE (Upper Arm Bike): 5 minutes level 1 backward Theraband Exercises Rows: Red;10 reps Standing Exercises Neck Retraction: 10 reps;3 secs;Limitations Neck Retraction Limitations: against wall    Physical Therapy Assessment and Plan PT Assessment:  Added UBE per PT POC. Therapist facilitation required to complete stretches in correct form. Pt with large spasm in Rt UT completely resolved with manual techniques. Pt progressing well overall with decreasing pain. Pt without pain at end of session. PT Plan: Re-evaluate next visit.     Problem List Patient Active Problem List   Diagnosis Date Noted  . Sprain of neck 07/10/2013  . Stiffness of joint, not elsewhere classified, other specified site 07/10/2013  . Breast cancer   . 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 Breast cancer of upper-outer quadrant of right female breast 02/24/2013  . Unspecified constipation 01/22/2013  . Dyspepsia 01/22/2013  . Vertigo 05/13/2012  . Headache 05/13/2012  . Neck pain 05/13/2012  . Menopausal symptoms   . Abscess 06/14/2011  . DYSPHAGIA UNSPECIFIED 09/14/2008  . MENOPAUSE-RELATED  VASOMOTOR SYMPTOMS, HOT FLASHES 03/25/2007  . HYPERTHYROIDISM 10/22/2006  . HYPERLIPIDEMIA 10/22/2006  . HYPERTENSION 10/22/2006  . OVERACTIVE BLADDER 10/22/2006  . FIBROCYSTIC BREAST DISEASE 10/22/2006  . SWEATING 10/22/2006  . INSOMNIA, HX OF 10/22/2006    PT - End of Session Activity Tolerance: Patient tolerated treatment well General Behavior During Therapy: WFL for tasks assessed/performed   Teena Irani, PTA/CLT 07/29/2013, 10:55 AM

## 2013-07-31 ENCOUNTER — Ambulatory Visit (HOSPITAL_COMMUNITY)
Admission: RE | Admit: 2013-07-31 | Discharge: 2013-07-31 | Disposition: A | Discharge status: Home / Self Care | Payer: Medicare PPO | Source: Ambulatory Visit | Attending: Internal Medicine | Admitting: Internal Medicine

## 2013-07-31 DIAGNOSIS — E785 Hyperlipidemia, unspecified: Secondary | ICD-10-CM | POA: Diagnosis not present

## 2013-07-31 DIAGNOSIS — R51 Headache: Secondary | ICD-10-CM | POA: Insufficient documentation

## 2013-07-31 DIAGNOSIS — IMO0001 Reserved for inherently not codable concepts without codable children: Secondary | ICD-10-CM | POA: Diagnosis not present

## 2013-07-31 DIAGNOSIS — I10 Essential (primary) hypertension: Secondary | ICD-10-CM | POA: Diagnosis not present

## 2013-07-31 DIAGNOSIS — M542 Cervicalgia: Secondary | ICD-10-CM | POA: Insufficient documentation

## 2013-07-31 DIAGNOSIS — M6281 Muscle weakness (generalized): Secondary | ICD-10-CM | POA: Insufficient documentation

## 2013-07-31 DIAGNOSIS — M539 Dorsopathy, unspecified: Secondary | ICD-10-CM | POA: Insufficient documentation

## 2013-07-31 NOTE — Evaluation (Signed)
Physical Therapy Reassessment/Discharge Patient Details  Name: [name] MRN: [id number] Date of Birth: November 20, 1950  Today's Date: 07/31/2013 Time: 0935-1005 PT Time Calculation (min): 30 min Charge: there ex [phone] Rogers; ROM/MMT [phone]; manual [phone]              Visit#: 6 of 6  Re-eval: 07/31/13 Assessment Diagnosis: neck pain and headaches secondary to limited mobility and weakness Next MD Visit: Stephanie Acre in September, and Radiologist, 25th of June Prior Therapy: no  Authorization: [employer]; 6 visits approved until 08/24/13      Authorization Visit#: 6 of 6   Past Medical History:  Past Medical History  Diagnosis Date  . Hypercholesteremia     takes Crestor daily  . Vertigo     doesn't take any meds  . Neck pain 05/13/2012  . Hypertension     takes HCTZ daily  . Hypothyroid     takes Synthroid daily  . Insomnia     takes Ambien nightly as needed  . Headache(784.0)     occasionally  . Neuropathy   . Arthritis   . Constipation   . History of colon polyps   . Diverticulosis   . History of bladder infections   . History of blood transfusion     no abnormal reaction noted  . Breast cancer 02/19/13    right, 10 o'clock  . Hx of radiation therapy 05/13/13- 06/16/13    right breast 5000 cGy in 25 sessions   Past Surgical History:  Past Surgical History  Procedure Laterality Date  . Thyroid surgery    . Cholecystectomy    . Esophagogastroduodenoscopy  09/17/2008    RMR:Two mid esophageal diverticula/Small benign cystic mucosal lesions distal esophagus of doubtful  clinical significance, stable for least 5 years/ Small hiatal hernia, otherwise normal stomach D1, D2  . Colonoscopy  09/17/2008    YVE:WKMF tortuous, but otherwise normal-appearing colon/. Scattered diverticula  . Esophagogastroduodenoscopy    02/10/2003    NOC:QLBVZM esophageal 3 cystic lesions without luminal compromise or evidence/Nonerosive antral gastritis/The esophagus was dilated by  passing 56 [place] Maloney dilator.Marland Kitchen Epic notes states +H.pylori gastritis. treatment completed per epic notes.   . Eus  Aug 2011    Dr. Margaretha Glassing: EGD with multiple lower esophageal submucosal nodules, stomach and duodenum normal, EUS with esophageal duplication cysts  . Esophagogastroduodenoscopy (egd) with esophageal dilation N/A 02/03/2013    TUU:AHRPET esophageal duplication cyst. Small esophageal diverticulum. Otherwise;  EGD normal - Widely patent tubular esophagus before and after dilation.  Status post passage of  a  Maloney dilator.  . Abdominal hysterectomy      partial  . Breast lumpectomy with needle localization and axillary sentinel lymph node bx Right 04/03/2013    Procedure: BREAST LUMPECTOMY WITH NEEDLE LOCALIZATION AND AXILLARY SENTINEL LYMPH NODE BX;  Surgeon: Robyne Askew, MD;  Location: [facility];  Service: General;  Laterality: Right;    Subjective Symptoms/Limitations Symptoms: [name] states that she did not have a headache yesterday or this morning.  Her pain in her neck is now at a 2/10 How long can you stand comfortably?: able to get all house cleaning done without breaks now  Pain Assessment Currently in Pain?: Yes Pain Score: 2  Pain Location: Neck Pain Orientation: Right Pain Type: Chronic pain Pain Relieving Factors: not using tylenol cream and hot pack as often.    Assessment Cervical AROM Cervical Flexion: wfl Cervical Extension: wfl Cervical - Right Side Bend: wnl Cervical - Left  Side Bend: wnl Cervical - Right Rotation: wnl Cervical - Left Rotation: wnl Cervical Strength Cervical Flexion: 5/5 (wnl 2+/5) Cervical Extension: 5/5 (was 2+/5) Cervical - Right Side Bend: 5/5 (was 2+/5) Cervical - Left Side Bend: 5/5 (was 2+5) Cervical - Right Rotation: 5/5 (was 2+/5) Cervical - Left Rotation: 5/5 (was 2+/5) Palpation Palpation: moderate to marked mm spasm mid trap area.  Exercise/Treatments UBE backward L1 x 6 minutes.     myofascial release to Rt  trapezius mm to decrease spasm and pain.  Physical Therapy Assessment and Plan PT Assessment and Plan Clinical Impression Statement: Pt currently has decreased pain and headaches; ROM and strength are wnl but continues to have moderate-marked spasm in Rt mid trapezius mm.  Pt authorization has ran out; pt would like to try and work on remaining deficits on her own and if she is unable to resolve pt will return to therapy. PT Plan: discharge patient to [facility]     Goals PT Short Term Goals Time to Complete Short Term Goals: 4 weeks PT Short Term Goal 1: patient will be able to rotate head >80 degrees bilaterally to check blind spot while driving. PT Short Term Goal 1 - Progress: Met PT Short Term Goal 2: Patient's neck pain will be less than 2/10 folloring 4 hours of working in home PT Short Term Goal 2 - Progress: Met PT Short Term Goal 3: Patient will be able to flex neck through full ROM without pain to be able to look down at here she's stepping when walking.  PT Short Term Goal 3 - Progress: Met PT Long Term Goals PT Long Term Goal 1: Patient will  report less that 2 head ache occurances per day PT Long Term Goal 1 - Progress: Met PT Long Term Goal 2: Patient's neck pain will be less than 2/10 follwoing a full day (>8hours) of workign around her home PT Long Term Goal 2 - Progress: Met Long Term Goal 3: Patient will be able to perform supine deep neck flexion test >60 seconds indicating improved heck sability and decreased reliabilioty of neck extensors for improved posture.  Long Term Goal 3 Progress: Met  Problem List Patient Active Problem List   Diagnosis Date Noted  . Sprain of neck 07/10/2013  . Stiffness of joint, not elsewhere classified, other specified site 07/10/2013  . Breast cancer   . 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 Breast cancer of upper-outer quadrant of right female breast 02/24/2013  . Unspecified constipation 01/22/2013  . Dyspepsia  01/22/2013  . Vertigo 05/13/2012  . Headache 05/13/2012  . Neck pain 05/13/2012  . Menopausal symptoms   . Abscess 06/14/2011  . DYSPHAGIA UNSPECIFIED 09/14/2008  . MENOPAUSE-RELATED VASOMOTOR SYMPTOMS, HOT FLASHES 03/25/2007  . HYPERTHYROIDISM 10/22/2006  . HYPERLIPIDEMIA 10/22/2006  . HYPERTENSION 10/22/2006  . OVERACTIVE BLADDER 10/22/2006  . FIBROCYSTIC BREAST DISEASE 10/22/2006  . SWEATING 10/22/2006  . INSOMNIA, HX OF 10/22/2006       GP    Onetha Gaffey,CINDY 07/31/2013, 10:13 AM  Physician Documentation Your signature is required to indicate approval of the treatment plan as stated above.  Please sign and either send electronically or make a copy of this report for your files and return this physician signed original.   Please mark one 1.__approve of plan  2. ___approve of plan with the following conditions.   ______________________________  _____________________ Physician Signature                                                                                                             Date

## 2013-08-05 ENCOUNTER — Ambulatory Visit (HOSPITAL_COMMUNITY): Payer: Medicare PPO

## 2013-08-07 ENCOUNTER — Ambulatory Visit (HOSPITAL_COMMUNITY): Payer: Medicare PPO

## 2013-09-02 ENCOUNTER — Encounter (INDEPENDENT_AMBULATORY_CARE_PROVIDER_SITE_OTHER): Payer: Self-pay | Admitting: General Surgery

## 2013-09-02 ENCOUNTER — Ambulatory Visit (INDEPENDENT_AMBULATORY_CARE_PROVIDER_SITE_OTHER): Payer: Medicare PPO | Admitting: General Surgery

## 2013-09-02 VITALS — BP 126/80 | HR 64 | Temp 97.5°F | Ht 64.0 in | Wt 170.0 lb

## 2013-09-02 DIAGNOSIS — D0511 Intraductal carcinoma in situ of right breast: Secondary | ICD-10-CM

## 2013-09-02 DIAGNOSIS — D0590 Unspecified type of carcinoma in situ of unspecified breast: Secondary | ICD-10-CM

## 2013-09-02 NOTE — Progress Notes (Signed)
Subjective:     Patient ID: [name], female   DOB: October 05, 1950, 63 y.o.   MRN: [id number]  HPI The patient is a 63 year old black female who is 6 months status post right lumpectomy for DCIS. She was ER and PR negative. Her main complaint is of some occasional sharp pains in the right breast and some occasional soreness of the right arm. She has not noticed any swelling of the right arm.  Review of Systems  Constitutional: Negative.   HENT: Negative.   Eyes: Negative.   Respiratory: Negative.   Cardiovascular: Negative.   Gastrointestinal: Negative.   Endocrine: Negative.   Genitourinary: Negative.   Musculoskeletal: Negative.   Skin: Negative.   Allergic/Immunologic: Negative.   Neurological: Negative.   Hematological: Negative.   Psychiatric/Behavioral: Negative.        Objective:   Physical Exam  Constitutional: She is oriented to person, place, and time. She appears well-developed and well-nourished.  HENT:  Head: Normocephalic and atraumatic.  Eyes: Conjunctivae and EOM are normal. Pupils are equal, round, and reactive to light.  Neck: Normal range of motion. Neck supple.  Cardiovascular: Normal rate, regular rhythm and normal heart sounds.   Pulmonary/Chest: Effort normal and breath sounds normal.  There is some mild fullness to the scar on the lateral right breast. There were also 3 blackheads on the skin of the medial right breast. Otherwise there is no other palpable mass in either breast. There is no palpable axillary, supraclavicular, or cervical lymphadenopathy.  Abdominal: Soft. Bowel sounds are normal.  Musculoskeletal: Normal range of motion.  Lymphadenopathy:    She has no cervical adenopathy.  Neurological: She is alert and oriented to person, place, and time.  Skin: Skin is warm and dry.  Psychiatric: She has a normal mood and affect. Her behavior is normal.       Assessment:     The patient is 6 months status post right lumpectomy for DCIS      Plan:     At this point she will continue to do regular self exams. I will plan to see her back in about 6 months.

## 2013-09-02 NOTE — Patient Instructions (Signed)
Continue regular self exams  

## 2013-09-11 ENCOUNTER — Ambulatory Visit: Payer: Medicare PPO | Admitting: Nurse Practitioner

## 2013-09-15 NOTE — Addendum Note (Signed)
Encounter addended by: Debby Bud, OT on: 09/15/2013  8:55 AM<BR>     Documentation filed: Episodes

## 2013-10-09 NOTE — Addendum Note (Signed)
Encounter addended by: Leia Alf, PT on: 10/09/2013  5:47 PM<BR>     Documentation filed: Clinical Notes, Flowsheet VN

## 2013-10-10 ENCOUNTER — Telehealth: Payer: Self-pay | Admitting: Hematology and Oncology

## 2013-10-10 NOTE — Telephone Encounter (Signed)
, °

## 2013-10-13 ENCOUNTER — Encounter: Payer: Self-pay | Admitting: Adult Health

## 2013-10-13 ENCOUNTER — Ambulatory Visit (INDEPENDENT_AMBULATORY_CARE_PROVIDER_SITE_OTHER): Payer: Medicare PPO | Admitting: Adult Health

## 2013-10-13 ENCOUNTER — Encounter (INDEPENDENT_AMBULATORY_CARE_PROVIDER_SITE_OTHER): Payer: Self-pay

## 2013-10-13 VITALS — BP 130/63 | HR 71 | Ht 64.5 in | Wt 176.0 lb

## 2013-10-13 DIAGNOSIS — R51 Headache: Secondary | ICD-10-CM

## 2013-10-13 DIAGNOSIS — M542 Cervicalgia: Secondary | ICD-10-CM

## 2013-10-13 NOTE — Progress Notes (Signed)
I have read the note, and I agree with the clinical assessment and plan.  WILLIS,CHARLES KEITH   

## 2013-10-13 NOTE — Progress Notes (Signed)
PATIENT: [name] DOB: Nov 17, 1950  REASON FOR VISIT: follow up HISTORY FROM: patient  HISTORY OF PRESENT ILLNESS: [name] is a 63 year old female with a history of headaches and neck pain. She returns today for follow-up. She was placed on gabapentin 300 mg BID and referred for neuromuscular therapy. She states that her headache improved with neuromuscular therapy. She states that if she does a lot of work throughout the day then her neck will start hurting on the right side and travel up the right side of her head to the eye. Her PCP increased her gabapentin to 300 mg BID. He also started her on diclofenac and baclofen last week. She states she just started the medication so she isn't sure if it is beneficial yet.  HISTORY 06/10/13 (CW): 63 year old right-handed black female with a history of migraine-type headache, and episodes of vertigo. 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 She had a lumpectomy as well. She has noted a two-month history of some right-sided headaches. She describes some pain and discomfort into the right shoulder, right neck, going up into the right side the head from occipital area to the frontotemporal region with pain behind the right eye. The patient reports a dull achy pain and some sharp shooting pains. She is suffering from chronic insomnia, and she will take Ambien for sleep. She does report some neck stiffness. BC powders will be helpful her headaches on occasion. She has had an episode of vertigo that occurred one month ago, lasting 2 weeks. She does the Epley maneuvers which seem to help. She denied any nausea and vomiting with the vertigo, but she indicates that she believes that tramadol brought on the vertigo. She returns to this office for an evaluation.  REVIEW OF SYSTEMS: Full 14 system review of systems performed and notable only for:  Constitutional: N/A  Eyes: N/A Ear/Nose/Throat:  N/A  Skin: N/A  Cardiovascular: N/A  Respiratory: N/A  Gastrointestinal: N/A  Genitourinary: N/A Hematology/Lymphatic: N/A  Endocrine: N/A Musculoskeletal: Back pain, neck pain Allergy/Immunology: N/A  Neurological: Headache Psychiatric: N/A Sleep: Insomnia   ALLERGIES: Allergies  Allergen Reactions  . Codeine Nausea And Vomiting  . Percocet [Oxycodone-Acetaminophen] Rash    HOME MEDICATIONS: Outpatient Prescriptions Prior to Visit  Medication Sig Dispense Refill  . aspirin EC 81 MG tablet Take 81 mg by mouth daily.      . fluticasone (FLONASE) 50 MCG/ACT nasal spray Place 50 sprays into the nose daily as needed for allergies.       . hyaluronate sodium (RADIAPLEXRX) GEL Apply 1 application topically 2 (two) times daily.      . hydrochlorothiazide (HYDRODIURIL) 25 MG tablet Take 25 mg by mouth daily.        Marland Kitchen HYDROcodone-acetaminophen (NORCO/VICODIN) 5-325 MG per tablet Take 1-2 tablets by mouth every 4 (four) hours as needed.  50 tablet  0  . ibuprofen (ADVIL,MOTRIN) 800 MG tablet Take 800 mg by mouth every 8 (eight) hours as needed for fever, headache or mild pain.       Marland Kitchen ketoconazole (NIZORAL) 2 % cream       . levothyroxine (SYNTHROID, LEVOTHROID) 88 MCG tablet Take 88 mcg by mouth daily.        . rosuvastatin (CRESTOR) 5 MG tablet Take 5 mg by mouth daily.        Marland Kitchen zolpidem (AMBIEN) 10 MG tablet Take 10 mg by mouth at bedtime as needed  for sleep.       Marland Kitchen gabapentin (NEURONTIN) 100 MG capsule Take 100 mg by mouth 2 (two) times daily.       No facility-administered medications prior to visit.    PAST MEDICAL HISTORY: Past Medical History  Diagnosis Date  . Hypercholesteremia     takes Crestor daily  . Vertigo     doesn't take any meds  . Neck pain 05/13/2012  . Hypertension     takes HCTZ daily  . Hypothyroid     takes Synthroid daily  . Insomnia     takes Ambien nightly as needed  . Headache(784.0)     occasionally  . Neuropathy   . Arthritis   .  Constipation   . History of colon polyps   . Diverticulosis   . History of bladder infections   . History of blood transfusion     no abnormal reaction noted  . Breast cancer 02/19/13    right, 10 o'clock  . Hx of radiation therapy 05/13/13- 06/16/13    right breast 5000 cGy in 25 sessions    PAST SURGICAL HISTORY: Past Surgical History  Procedure Laterality Date  . Thyroid surgery    . Cholecystectomy    . Esophagogastroduodenoscopy  09/17/2008    RMR:Two mid esophageal diverticula/Small benign cystic mucosal lesions distal esophagus of doubtful  clinical significance, stable for least 5 years/ Small hiatal hernia, otherwise normal stomach D1, D2  . Colonoscopy  09/17/2008    JKD:TOIZ tortuous, but otherwise normal-appearing colon/. Scattered diverticula  . Esophagogastroduodenoscopy    02/10/2003    TIW:PYKDXI esophageal 3 cystic lesions without luminal compromise or evidence/Nonerosive antral gastritis/The esophagus was dilated by passing 56 [place] Maloney dilator.Marland Kitchen Epic notes states +H.pylori gastritis. treatment completed per epic notes.   . Eus  Aug 2011    Dr. Newman Pies: EGD with multiple lower esophageal submucosal nodules, stomach and duodenum normal, EUS with esophageal duplication cysts  . Esophagogastroduodenoscopy (egd) with esophageal dilation N/A 02/03/2013    PJA:SNKNLZ esophageal duplication cyst. Small esophageal diverticulum. Otherwise;  EGD normal - Widely patent tubular esophagus before and after dilation.  Status post passage of  a  Maloney dilator.  . Abdominal hysterectomy      partial  . Breast lumpectomy with needle localization and axillary sentinel lymph node bx Right 04/03/2013    Procedure: BREAST LUMPECTOMY WITH NEEDLE LOCALIZATION AND AXILLARY SENTINEL LYMPH NODE BX;  Surgeon: Merrie Roof, MD;  Location: [facility];  Service: General;  Laterality: Right;    FAMILY HISTORY: Family History  Problem Relation Age of Onset  . Lupus Sister   . Hypertension Brother    . Diabetes Brother   . Hypertension Brother   . Diabetes Brother   . Multiple sclerosis Mother   . Cancer Father   . Colon cancer Neg Hx     SOCIAL HISTORY: History   Social History  . Marital Status: Married    Spouse Name: Joe    Number of Children: 3  . Years of Education: 11   Occupational History  . Retired    Social History Main Topics  . Smoking status: Never Smoker   . Smokeless tobacco: Never Used  . Alcohol Use: No  . Drug Use: No  . Sexual Activity: Yes    Birth Control/ Protection: Surgical     Comment: menarche age 57, P25, first live birth age 63, menopause 48, no HRT, HYST   Other Topics Concern  . Not on file  Social History Narrative   Patient lives at home with her husband Lexie Koehl.    Patient has 3 children.    Patient is retired.    Patient has an 11th grade education.             PHYSICAL EXAM  Filed Vitals:   10/13/13 1135  BP: 130/63  Pulse: 71  Height: 5' 4.5" (1.638 m)  Weight: 176 lb (79.833 kg)   Body mass index is 29.75 kg/(m^2).  Generalized: Well developed, in no acute distress   Neurological examination  Mentation: Alert oriented to time, place, history taking. Follows all commands speech and language fluent Cranial nerve II-XII: Pupils were equal round reactive to light. Extraocular movements were full, visual field were full on confrontational test. Facial sensation and strength were normal. Uvula tongue midline. Head turning and shoulder shrug  were normal and symmetric Motor: The motor testing reveals 5 over 5 strength of all 4 extremities. Good symmetric motor tone is noted throughout.  Sensory: Sensory testing is intact to soft touch on all 4 extremities. No evidence of extinction is noted.  Coordination: Cerebellar testing reveals good finger-nose-finger and heel-to-shin bilaterally.  Gait and station: Gait is normal. Tandem gait is normal. Romberg is negative. No drift is seen.  Reflexes: Deep tendon reflexes  are symmetric and normal bilaterally.  Marland Kitchen   DIAGNOSTIC DATA (LABS, IMAGING, TESTING) - I reviewed patient records, labs, notes, testing and imaging myself where available.  Lab Results  Component Value Date   WBC 6.0 03/26/2013   HGB 12.0 03/26/2013   HCT 37.4 03/26/2013   MCV 87.4 03/26/2013   PLT 310 03/26/2013      Component Value Date/Time   NA 140 03/26/2013 0941   NA 141 03/05/2013 0818   K 3.6* 03/26/2013 0941   K 3.1* 03/05/2013 0818   CL 101 03/26/2013 0941   CO2 25 03/26/2013 0941   CO2 28 03/05/2013 0818   GLUCOSE 103* 03/26/2013 0941   GLUCOSE 108 03/05/2013 0818   BUN 10 03/26/2013 0941   BUN 8.6 03/05/2013 0818   CREATININE 0.79 03/26/2013 0941   CREATININE 0.8 03/05/2013 0818   CALCIUM 9.2 03/26/2013 0941   CALCIUM 9.3 03/05/2013 0818   PROT 7.4 03/05/2013 0818   PROT 8.1 11/20/2012 1609   ALBUMIN 3.8 03/05/2013 0818   ALBUMIN 3.9 11/20/2012 1609   AST 18 03/05/2013 0818   AST 21 11/20/2012 1609   ALT 17 03/05/2013 0818   ALT 15 11/20/2012 1609   ALKPHOS 65 03/05/2013 0818   ALKPHOS 65 11/20/2012 1609   BILITOT 0.51 03/05/2013 0818   BILITOT 0.4 11/20/2012 1609   GFRNONAA 86* 03/26/2013 0941   GFRAA >90 03/26/2013 0941    Lab Results  Component Value Date   TSH 1.430 10/22/2006      ASSESSMENT AND PLAN 63 y.o. year old female  has a past medical history of Hypercholesteremia; Vertigo; Neck pain (05/13/2012); Hypertension; Hypothyroid; Insomnia; Headache(784.0); Neuropathy; Arthritis; Constipation; History of colon polyps; Diverticulosis; History of bladder infections; History of blood transfusion; Breast cancer (02/19/13); and radiation therapy (05/13/13- 06/16/13). here with :  1. Migraine  Patient PCP recently increased her gabapentin and started her on diclofenac and baclofen for her neck pain that progresses to a headache. Since she just started these medications, she should give them a try, if they do not improve her headaches then she should call us and we will discuss other options.  Otherwise she should follow-up in 4 months or  sooner if needed.   Ward Givens, MSN, NP-C 10/13/2013, 11:35 AM [facility] [address] [place], [place] [address] [phone]  Note: This document was prepared with digital dictation and possible smart phrase technology. Any transcriptional errors that result from this process are unintentional. ;;;;;;;;;;

## 2013-10-13 NOTE — Patient Instructions (Signed)

## 2013-10-15 ENCOUNTER — Ambulatory Visit: Payer: Medicare PPO | Admitting: Oncology

## 2013-11-04 ENCOUNTER — Ambulatory Visit (HOSPITAL_BASED_OUTPATIENT_CLINIC_OR_DEPARTMENT_OTHER): Payer: Medicare PPO | Admitting: Hematology and Oncology

## 2013-11-04 ENCOUNTER — Telehealth: Payer: Self-pay | Admitting: Hematology and Oncology

## 2013-11-04 VITALS — BP 136/58 | HR 80 | Temp 99.3°F | Resp 18 | Ht 64.5 in | Wt 172.0 lb

## 2013-11-04 DIAGNOSIS — C50411 Malignant neoplasm of upper-outer quadrant of right female breast: Secondary | ICD-10-CM

## 2013-11-04 DIAGNOSIS — D0511 Intraductal carcinoma in situ of right breast: Secondary | ICD-10-CM

## 2013-11-04 NOTE — Progress Notes (Signed)
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 It was estrogen progesterone negative did not need antiestrogen therapy. She is here for routine followup and surveillance. She continues to have discomfort in the breast and is very tender and sensitive that she cannot sleep on that side. She wants to know if radiographic still can still be helpful for the increased sensitivity. She will go down to radiation oncology and seek a sample to try. Patient also has been having increased problems with neck pain radiating down the arm as well as difficulty with lifting the arm over the shoulder level. [facility] physical therapy under the guidance of neurology is or her neuropathy issues and she was tried on Neurontin which she could not tolerate it 300 twice a day dosage is not 100 mg dosage. She's also been on ibuprofen as well as diclofenac sodium none of which have been very effective for the pain.  REVIEW OF SYSTEMS:   Constitutional: Denies fevers, chills or abnormal weight loss Eyes: Denies blurriness of vision Ears, nose,  mouth, throat, and face: Denies mucositis or sore throat Respiratory: Denies cough, dyspnea or wheezes Cardiovascular: Denies palpitation, chest discomfort or lower extremity swelling Gastrointestinal:  Denies nausea, heartburn or change in bowel habits Skin: Denies abnormal skin rashes Lymphatics: Denies new lymphadenopathy or easy bruising Neurological:Denies numbness, tingling or new weaknesses Behavioral/Psych: Mood is stable, no new changes  Breast: Increased sensitivity in the right breast All other systems were reviewed with the patient and are negative.  I have reviewed the past medical history, past surgical history, social history and family history with the patient and they are unchanged from previous note.  ALLERGIES:  is allergic to codeine and percocet.  MEDICATIONS:  Current Outpatient Prescriptions  Medication Sig Dispense Refill  . aspirin EC 81 MG tablet Take 81 mg by mouth daily.      . baclofen (LIORESAL) 10 MG tablet Take 10 mg by mouth 3 (three) times daily. Taking 1/2 tablet twice daily      . benzoyl peroxide-erythromycin (BENZAMYCIN) gel       . diclofenac (VOLTAREN) 75 MG EC tablet Take 75 mg by mouth daily as needed.       . fluticasone (FLONASE) 50 MCG/ACT nasal spray Place 50 sprays into the nose daily as needed for allergies.       Marland Kitchen gabapentin (NEURONTIN) 300 MG capsule Take 300 mg by mouth 2 (two) times daily.      . hyaluronate sodium (RADIAPLEXRX) GEL Apply 1 application topically 2 (two) times daily.      . hydrochlorothiazide (HYDRODIURIL) 25 MG tablet Take 25 mg by mouth daily.        Marland Kitchen  HYDROcodone-acetaminophen (NORCO/VICODIN) 5-325 MG per tablet Take 1-2 tablets by mouth every 4 (four) hours as needed.  50 tablet  0  . ibuprofen (ADVIL,MOTRIN) 800 MG tablet Take 800 mg by mouth every 8 (eight) hours as needed for fever, headache or mild pain.       Marland Kitchen ketoconazole (NIZORAL) 2 % cream       . levothyroxine (SYNTHROID, LEVOTHROID) 88 MCG tablet Take 88  mcg by mouth daily.        . rosuvastatin (CRESTOR) 5 MG tablet Take 5 mg by mouth daily.        Marland Kitchen zolpidem (AMBIEN) 10 MG tablet Take 10 mg by mouth at bedtime as needed for sleep.        No current facility-administered medications for this visit.    PHYSICAL EXAMINATION: ECOG PERFORMANCE STATUS: 1 - Symptomatic but completely ambulatory  Filed Vitals:   11/04/13 0936  BP: 136/58  Pulse: 80  Temp: 99.3 F (37.4 C)  Resp: 18   Filed Weights   11/04/13 0936  Weight: 172 lb (78.019 kg)    GENERAL:alert, no distress and comfortable SKIN: skin color, texture, turgor are normal, no rashes or significant lesions EYES: normal, Conjunctiva are pink and non-injected, sclera clear OROPHARYNX:no exudate, no erythema and lips, buccal mucosa, and tongue normal  NECK: supple, thyroid normal size, non-tender, without nodularity LYMPH:  no palpable lymphadenopathy in the cervical, axillary or inguinal LUNGS: clear to auscultation and percussion with normal breathing effort HEART: regular rate & rhythm and no murmurs and no lower extremity edema ABDOMEN:abdomen soft, non-tender and normal bowel sounds Musculoskeletal:no cyanosis of digits and no clubbing  NEURO: alert & oriented x 3 with fluent speech, no focal motor/sensory deficits BREAST: No palpable masses or nodules in either right or left breasts. Discoloration the right breast. No palpable axillary supraclavicular or infraclavicular adenopathy no breast tenderness or nipple discharge.   LABORATORY DATA:  I have reviewed the data as listed   Chemistry      Component Value Date/Time   NA 140 03/26/2013 0941   NA 141 03/05/2013 0818   K 3.6* 03/26/2013 0941   K 3.1* 03/05/2013 0818   CL 101 03/26/2013 0941   CO2 25 03/26/2013 0941   CO2 28 03/05/2013 0818   BUN 10 03/26/2013 0941   BUN 8.6 03/05/2013 0818   CREATININE 0.79 03/26/2013 0941   CREATININE 0.8 03/05/2013 0818      Component Value Date/Time   CALCIUM 9.2 03/26/2013 0941    CALCIUM 9.3 03/05/2013 0818   ALKPHOS 65 03/05/2013 0818   ALKPHOS 65 11/20/2012 1609   AST 18 03/05/2013 0818   AST 21 11/20/2012 1609   ALT 17 03/05/2013 0818   ALT 15 11/20/2012 1609   BILITOT 0.51 03/05/2013 0818   BILITOT 0.4 11/20/2012 1609       Lab Results  Component Value Date   WBC 6.0 03/26/2013   HGB 12.0 03/26/2013   HCT 37.4 03/26/2013   MCV 87.4 03/26/2013   PLT 310 03/26/2013   NEUTROABS 5.8 03/05/2013     RADIOGRAPHIC STUDIES: I have personally reviewed the radiology reports and agreed with their findings. No results found.   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 She has been on multiple over-the-counter and other prescription pain medications and muscle relaxants but she still continues to have pain and discomfort.  Breast discomfort: I recommended acupuncture therapy but since it is not covered through insurance, we're unable to make the referral this time.  Surveillance: Patient will call and schedule her mammogram at the end of January 2016. Today's breast exam did not reveal any suspicious findings. Return to clinic in 6 months for followup.   Orders Placed This Encounter  Procedures  . CBC with Differential    Standing Status: Future     Number of Occurrences:      Standing Expiration Date: 11/04/2014  . Comprehensive metabolic panel (Cmet) - [facility]    Standing Status: Future     Number of Occurrences:      Standing Expiration Date: 11/04/2014   The patient has a good understanding of the overall plan. she agrees with it. She will call with any problems that may develop before her next visit here.  I spent 25 minutes counseling the patient face to face. The total time  spent in the appointment was 30 minutes and more than 50% was on counseling and review of test results    Rulon Eisenmenger, MD 11/04/2013 10:39 AM

## 2013-11-04 NOTE — Assessment & Plan Note (Signed)
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 She has been on multiple over-the-counter and other prescription pain medications and muscle relaxants but she still continues to have pain and discomfort.  Breast discomfort: I recommended acupuncture therapy but since it is not covered through insurance, we're unable to make the referral this time.  Surveillance: Patient will call and schedule her mammogram at the end of January 2016. Today's breast exam did not reveal any suspicious findings. Return to clinic in 6 months for followup.

## 2013-11-04 NOTE — Telephone Encounter (Signed)
per pof to sch pt appt-gave pt copy of sch °

## 2013-11-06 ENCOUNTER — Telehealth: Payer: Self-pay | Admitting: Adult Health

## 2013-11-06 NOTE — Telephone Encounter (Signed)
Spoke to patient and she has gone done on Gabapentin to 100mg  because it is making her dizzy, and stopped Baclofen because it isn't helping.  But she is taking the [place], saying nothing is working.  Please advise on home number.

## 2013-11-06 NOTE — Telephone Encounter (Signed)
Patient calling to state that her Baclofen and Diclofan are not working and states that her Gabapentin makes her too dizzy. Please return call and advise.

## 2013-11-07 MED ORDER — TIZANIDINE HCL 4 MG PO TABS: 4.0000 mg | ORAL_TABLET | Freq: Every day | ORAL | Status: DC

## 2013-11-07 NOTE — Telephone Encounter (Signed)
I called the patient. She continues to have neck pain and headaches. She states that the baclofen and diclofenac that her PCP gave her was not working so she has stopped taking those. I will order tizanidine 4 mg daily at bedtime. She will let us know if this is beneficial.

## 2013-12-01 ENCOUNTER — Encounter: Payer: Self-pay | Admitting: Adult Health

## 2013-12-17 ENCOUNTER — Encounter: Payer: Self-pay | Admitting: Neurology

## 2013-12-23 ENCOUNTER — Encounter: Payer: Self-pay | Admitting: Neurology

## 2014-02-02 DIAGNOSIS — M5416 Radiculopathy, lumbar region: Secondary | ICD-10-CM | POA: Insufficient documentation

## 2014-02-02 DIAGNOSIS — M5412 Radiculopathy, cervical region: Secondary | ICD-10-CM | POA: Insufficient documentation

## 2014-02-12 ENCOUNTER — Ambulatory Visit: Payer: Medicare PPO | Admitting: Adult Health

## 2014-02-23 ENCOUNTER — Other Ambulatory Visit: Payer: Self-pay | Admitting: General Surgery

## 2014-02-23 DIAGNOSIS — Z853 Personal history of malignant neoplasm of breast: Secondary | ICD-10-CM

## 2014-03-13 ENCOUNTER — Other Ambulatory Visit: Payer: Self-pay | Admitting: Neurological Surgery

## 2014-03-17 ENCOUNTER — Ambulatory Visit
Admission: RE | Admit: 2014-03-17 | Discharge: 2014-03-17 | Disposition: A | Discharge status: Home / Self Care | Payer: Medicare PPO | Source: Ambulatory Visit | Attending: General Surgery | Admitting: General Surgery

## 2014-03-17 DIAGNOSIS — Z853 Personal history of malignant neoplasm of breast: Secondary | ICD-10-CM

## 2014-03-23 ENCOUNTER — Encounter (HOSPITAL_COMMUNITY)
Admission: RE | Admit: 2014-03-23 | Discharge: 2014-03-23 | Disposition: A | Discharge status: Home / Self Care | Payer: Medicare PPO | Source: Ambulatory Visit | Attending: Neurological Surgery | Admitting: Neurological Surgery

## 2014-03-23 ENCOUNTER — Encounter (HOSPITAL_COMMUNITY): Payer: Self-pay

## 2014-03-23 DIAGNOSIS — Z79899 Other long term (current) drug therapy: Secondary | ICD-10-CM | POA: Diagnosis not present

## 2014-03-23 DIAGNOSIS — E039 Hypothyroidism, unspecified: Secondary | ICD-10-CM | POA: Diagnosis not present

## 2014-03-23 DIAGNOSIS — I10 Essential (primary) hypertension: Secondary | ICD-10-CM | POA: Diagnosis not present

## 2014-03-23 DIAGNOSIS — M47892 Other spondylosis, cervical region: Secondary | ICD-10-CM | POA: Diagnosis not present

## 2014-03-23 DIAGNOSIS — M4302 Spondylolysis, cervical region: Secondary | ICD-10-CM | POA: Diagnosis present

## 2014-03-23 DIAGNOSIS — G8929 Other chronic pain: Secondary | ICD-10-CM | POA: Diagnosis not present

## 2014-03-23 HISTORY — DX: Dorsalgia, unspecified: M54.9

## 2014-03-23 HISTORY — DX: Other chronic pain: G89.29

## 2014-03-23 LAB — SURGICAL PCR SCREEN
MRSA, PCR: NEGATIVE
Staphylococcus aureus: NEGATIVE

## 2014-03-23 LAB — BASIC METABOLIC PANEL
ANION GAP: 8 (ref 5–15)
BUN: 13 mg/dL (ref 6–23)
CHLORIDE: 105 mmol/L (ref 96–112)
CO2: 26 mmol/L (ref 19–32)
Calcium: 9.2 mg/dL (ref 8.4–10.5)
Creatinine, Ser: 1 mg/dL (ref 0.50–1.10)
GFR calc non Af Amer: 58 mL/min — ABNORMAL LOW (ref >90)
GFR, EST AFRICAN AMERICAN: 67 mL/min — AB (ref >90)
Glucose, Bld: 98 mg/dL (ref 70–99)
POTASSIUM: 3.8 mmol/L (ref 3.5–5.1)
Sodium: 139 mmol/L (ref 135–145)

## 2014-03-23 LAB — CBC WITH DIFFERENTIAL/PLATELET
Basophils Absolute: 0 10*3/uL (ref 0.0–0.1)
Basophils Relative: 0 % (ref 0–1)
EOS ABS: 0.2 10*3/uL (ref 0.0–0.7)
Eosinophils Relative: 3 % (ref 0–5)
HCT: 37 % (ref 36.0–46.0)
HEMOGLOBIN: 11.8 g/dL — AB (ref 12.0–15.0)
Lymphocytes Relative: 30 % (ref 12–46)
Lymphs Abs: 1.7 10*3/uL (ref 0.7–4.0)
MCH: 27.5 pg (ref 26.0–34.0)
MCHC: 31.9 g/dL (ref 30.0–36.0)
MCV: 86.2 fL (ref 78.0–100.0)
MONO ABS: 0.3 10*3/uL (ref 0.1–1.0)
MONOS PCT: 5 % (ref 3–12)
NEUTROS ABS: 3.5 10*3/uL (ref 1.7–7.7)
Neutrophils Relative %: 62 % (ref 43–77)
PLATELETS: 270 10*3/uL (ref 150–400)
RBC: 4.29 MIL/uL (ref 3.87–5.11)
RDW: 13.6 % (ref 11.5–15.5)
WBC: 5.7 10*3/uL (ref 4.0–10.5)

## 2014-03-23 LAB — PROTIME-INR
INR: 0.97 (ref 0.00–1.49)
PROTHROMBIN TIME: 13 s (ref 11.6–15.2)

## 2014-03-23 NOTE — Progress Notes (Addendum)
Pt doesn't have a cardiologist  Medical MD is Dr.Fagan  Denies ever having an echo/stress test/heart cath  EKG and CXR in epic from 03-26-13  Sleep study done 8+yrs ago but not diagnosed with sleep apnea

## 2014-03-23 NOTE — Pre-Procedure Instructions (Signed)
Mary DANYELL SHADER  03/23/2014   Your procedure is scheduled on:  Thurs, Feb 25 @ 1:35 PM  Report to [facility]  at 11:30 AM.  Call this number if you have problems the morning of surgery: [phone]   Remember:   Do not eat food or drink liquids after midnight.   Take these medicines the morning of surgery with A SIP OF WATER: Flonase(Fluticasone),Pain Pill(if needed),and Synthroid(Levothyroxine)              Stop taking your Aspirin. No Goody's,BC's,Aleve,Ibuprofen,Fish Oil,or any Herbal Medications   Do not wear jewelry, make-up or nail polish.  Do not wear lotions, powders, or perfumes. You may wear deodorant.  Do not shave 48 hours prior to surgery.   Do not bring valuables to the hospital.  [facility] is not responsible                  for any belongings or valuables.               Contacts, dentures or bridgework may not be worn into surgery.  Leave suitcase in the car. After surgery it may be brought to your room.  For patients admitted to the hospital, discharge time is determined by your                treatment team.               Special Instructions:  [facility] - Preparing for Surgery  Before surgery, you can play an important role.  Because skin is not sterile, your skin needs to be as free of germs as possible.  You can reduce the number of germs on you skin by washing with CHG (chlorahexidine gluconate) soap before surgery.  CHG is an antiseptic cleaner which kills germs and bonds with the skin to continue killing germs even after washing.  Please DO NOT use if you have an allergy to CHG or antibacterial soaps.  If your skin becomes reddened/irritated stop using the CHG and inform your nurse when you arrive at Short Stay.  Do not shave (including legs and underarms) for at least 48 hours prior to the first CHG shower.  You may shave your face.  Please follow these instructions carefully:   1.  Shower with CHG Soap the night before surgery and the                                 morning of Surgery.  2.  If you choose to wash your hair, wash your hair first as usual with your       normal shampoo.  3.  After you shampoo, rinse your hair and body thoroughly to remove the                      Shampoo.  4.  Use CHG as you would any other liquid soap.  You can apply chg directly       to the skin and wash gently with scrungie or a clean washcloth.  5.  Apply the CHG Soap to your body ONLY FROM THE NECK DOWN.        Do not use on open wounds or open sores.  Avoid contact with your eyes,       ears, mouth and genitals (private parts).  Wash genitals (private parts)       with  your normal soap.  6.  Wash thoroughly, paying special attention to the area where your surgery        will be performed.  7.  Thoroughly rinse your body with warm water from the neck down.  8.  DO NOT shower/wash with your normal soap after using and rinsing off       the CHG Soap.  9.  Pat yourself dry with a clean towel.            10.  Wear clean pajamas.            11.  Place clean sheets on your bed the night of your first shower and do not        sleep with pets.  Day of Surgery  Do not apply any lotions/deoderants the morning of surgery.  Please wear clean clothes to the hospital/surgery center.     Please read over the following fact sheets that you were given: Pain Booklet, Coughing and Deep Breathing, MRSA Information and Surgical Site Infection Prevention

## 2014-03-25 MED ORDER — CEFAZOLIN SODIUM-DEXTROSE 2-3 GM-% IV SOLR
2.0000 g | INTRAVENOUS | Status: AC
  Administered 2014-03-26: 2 g via INTRAVENOUS
  Filled 2014-03-25: qty 50

## 2014-03-25 MED ORDER — DEXAMETHASONE SODIUM PHOSPHATE 10 MG/ML IJ SOLN
10.0000 mg | INTRAMUSCULAR | Status: AC
  Administered 2014-03-26: 10 mg via INTRAVENOUS
  Filled 2014-03-25: qty 1

## 2014-03-26 ENCOUNTER — Encounter (HOSPITAL_COMMUNITY)
Admission: RE | Disposition: A | Discharge status: Home / Self Care | Payer: Self-pay | Source: Ambulatory Visit | Attending: Neurological Surgery

## 2014-03-26 ENCOUNTER — Encounter (HOSPITAL_COMMUNITY): Payer: Self-pay | Admitting: Anesthesiology

## 2014-03-26 ENCOUNTER — Inpatient Hospital Stay (HOSPITAL_COMMUNITY): Payer: Medicare PPO | Admitting: Anesthesiology

## 2014-03-26 ENCOUNTER — Inpatient Hospital Stay (HOSPITAL_COMMUNITY): Payer: Medicare PPO

## 2014-03-26 ENCOUNTER — Ambulatory Visit (HOSPITAL_COMMUNITY)
Admission: RE | Admit: 2014-03-26 | Discharge: 2014-03-27 | Disposition: A | Discharge status: Home / Self Care | Payer: Medicare PPO | Source: Ambulatory Visit | Attending: Neurological Surgery | Admitting: Neurological Surgery

## 2014-03-26 DIAGNOSIS — E039 Hypothyroidism, unspecified: Secondary | ICD-10-CM | POA: Diagnosis not present

## 2014-03-26 DIAGNOSIS — M47892 Other spondylosis, cervical region: Secondary | ICD-10-CM | POA: Diagnosis not present

## 2014-03-26 DIAGNOSIS — M4322 Fusion of spine, cervical region: Secondary | ICD-10-CM

## 2014-03-26 DIAGNOSIS — I10 Essential (primary) hypertension: Secondary | ICD-10-CM | POA: Diagnosis not present

## 2014-03-26 DIAGNOSIS — Z79899 Other long term (current) drug therapy: Secondary | ICD-10-CM | POA: Insufficient documentation

## 2014-03-26 DIAGNOSIS — G8929 Other chronic pain: Secondary | ICD-10-CM | POA: Insufficient documentation

## 2014-03-26 DIAGNOSIS — Z981 Arthrodesis status: Secondary | ICD-10-CM

## 2014-03-26 HISTORY — PX: ANTERIOR CERVICAL DECOMP/DISCECTOMY FUSION: SHX1161

## 2014-03-26 SURGERY — ANTERIOR CERVICAL DECOMPRESSION/DISCECTOMY FUSION 1 LEVEL
Anesthesia: General | Site: Neck

## 2014-03-26 MED ORDER — DEXTROSE 5 % IV SOLN
500.0000 mg | Freq: Four times a day (QID) | INTRAVENOUS | Status: DC | PRN
  Filled 2014-03-26: qty 5

## 2014-03-26 MED ORDER — THROMBIN 5000 UNITS EX SOLR
CUTANEOUS | Status: DC | PRN
  Administered 2014-03-26 (×2): 5000 [IU] via TOPICAL

## 2014-03-26 MED ORDER — SODIUM CHLORIDE 0.9 % IJ SOLN
3.0000 mL | Freq: Two times a day (BID) | INTRAMUSCULAR | Status: DC
  Administered 2014-03-26: 3 mL via INTRAVENOUS

## 2014-03-26 MED ORDER — METHOCARBAMOL 500 MG PO TABS
500.0000 mg | ORAL_TABLET | Freq: Four times a day (QID) | ORAL | Status: DC | PRN
  Administered 2014-03-26: 500 mg via ORAL
  Filled 2014-03-26: qty 1

## 2014-03-26 MED ORDER — ROCURONIUM BROMIDE 100 MG/10ML IV SOLN
INTRAVENOUS | Status: DC | PRN
  Administered 2014-03-26: 40 mg via INTRAVENOUS

## 2014-03-26 MED ORDER — HYDROCHLOROTHIAZIDE 25 MG PO TABS
25.0000 mg | ORAL_TABLET | Freq: Every day | ORAL | Status: DC
  Filled 2014-03-26: qty 1

## 2014-03-26 MED ORDER — HYDROMORPHONE HCL 2 MG PO TABS: 2.0000 mg | ORAL_TABLET | ORAL | Status: DC | PRN

## 2014-03-26 MED ORDER — HYDROMORPHONE HCL 1 MG/ML IJ SOLN
0.2500 mg | INTRAMUSCULAR | Status: DC | PRN
  Administered 2014-03-26 (×4): 0.5 mg via INTRAVENOUS

## 2014-03-26 MED ORDER — SODIUM CHLORIDE 0.9 % IJ SOLN: 3.0000 mL | INTRAMUSCULAR | Status: DC | PRN

## 2014-03-26 MED ORDER — GLYCOPYRROLATE 0.2 MG/ML IJ SOLN
INTRAMUSCULAR | Status: DC | PRN
  Administered 2014-03-26: .7 mg via INTRAVENOUS

## 2014-03-26 MED ORDER — MENTHOL 3 MG MT LOZG: 1.0000 | LOZENGE | OROMUCOSAL | Status: DC | PRN

## 2014-03-26 MED ORDER — THROMBIN 5000 UNITS EX SOLR
OROMUCOSAL | Status: DC | PRN
  Administered 2014-03-26: 10 mL via TOPICAL

## 2014-03-26 MED ORDER — HYDROCODONE-ACETAMINOPHEN 5-325 MG PO TABS
1.0000 | ORAL_TABLET | ORAL | Status: DC | PRN
  Administered 2014-03-26 – 2014-03-27 (×2): 2 via ORAL
  Filled 2014-03-26 (×2): qty 2

## 2014-03-26 MED ORDER — LACTATED RINGERS IV SOLN
INTRAVENOUS | Status: DC | PRN
  Administered 2014-03-26 (×2): via INTRAVENOUS

## 2014-03-26 MED ORDER — PROPOFOL 10 MG/ML IV BOLUS
INTRAVENOUS | Status: AC
  Filled 2014-03-26: qty 20

## 2014-03-26 MED ORDER — PROPOFOL 10 MG/ML IV BOLUS
INTRAVENOUS | Status: DC | PRN
  Administered 2014-03-26: 150 mg via INTRAVENOUS

## 2014-03-26 MED ORDER — ONDANSETRON HCL 4 MG/2ML IJ SOLN: 4.0000 mg | INTRAMUSCULAR | Status: DC | PRN

## 2014-03-26 MED ORDER — SODIUM CHLORIDE 0.9 % IR SOLN
Status: DC | PRN
  Administered 2014-03-26: 500 mL

## 2014-03-26 MED ORDER — MORPHINE SULFATE 2 MG/ML IJ SOLN: 1.0000 mg | INTRAMUSCULAR | Status: DC | PRN

## 2014-03-26 MED ORDER — NEOSTIGMINE METHYLSULFATE 10 MG/10ML IV SOLN
INTRAVENOUS | Status: DC | PRN
  Administered 2014-03-26: 4 mg via INTRAVENOUS

## 2014-03-26 MED ORDER — MIDAZOLAM HCL 2 MG/2ML IJ SOLN
INTRAMUSCULAR | Status: AC
  Filled 2014-03-26: qty 2

## 2014-03-26 MED ORDER — CEFAZOLIN SODIUM 1-5 GM-% IV SOLN
1.0000 g | Freq: Three times a day (TID) | INTRAVENOUS | Status: AC
  Administered 2014-03-26 – 2014-03-27 (×2): 1 g via INTRAVENOUS
  Filled 2014-03-26 (×2): qty 50

## 2014-03-26 MED ORDER — HYDROMORPHONE HCL 1 MG/ML IJ SOLN
INTRAMUSCULAR | Status: AC
  Administered 2014-03-26: 0.5 mg via INTRAVENOUS
  Filled 2014-03-26: qty 1

## 2014-03-26 MED ORDER — POTASSIUM CHLORIDE IN NACL 20-0.9 MEQ/L-% IV SOLN
INTRAVENOUS | Status: DC
  Filled 2014-03-26 (×3): qty 1000

## 2014-03-26 MED ORDER — ONDANSETRON HCL 4 MG/2ML IJ SOLN
INTRAMUSCULAR | Status: DC | PRN
  Administered 2014-03-26: 4 mg via INTRAVENOUS

## 2014-03-26 MED ORDER — PHENOL 1.4 % MT LIQD: 1.0000 | OROMUCOSAL | Status: DC | PRN

## 2014-03-26 MED ORDER — FLUTICASONE PROPIONATE 50 MCG/ACT NA SUSP
1.0000 | Freq: Every day | NASAL | Status: DC | PRN
  Filled 2014-03-26: qty 16

## 2014-03-26 MED ORDER — OXYCODONE HCL 5 MG PO TABS: 5.0000 mg | ORAL_TABLET | Freq: Once | ORAL | Status: DC | PRN

## 2014-03-26 MED ORDER — DEXAMETHASONE 4 MG PO TABS
4.0000 mg | ORAL_TABLET | Freq: Four times a day (QID) | ORAL | Status: DC
  Administered 2014-03-26 – 2014-03-27 (×3): 4 mg via ORAL
  Filled 2014-03-26 (×7): qty 1

## 2014-03-26 MED ORDER — DEXAMETHASONE SODIUM PHOSPHATE 4 MG/ML IJ SOLN
4.0000 mg | Freq: Four times a day (QID) | INTRAMUSCULAR | Status: DC
  Filled 2014-03-26 (×4): qty 1

## 2014-03-26 MED ORDER — 0.9 % SODIUM CHLORIDE (POUR BTL) OPTIME
TOPICAL | Status: DC | PRN
  Administered 2014-03-26: 1000 mL

## 2014-03-26 MED ORDER — LACTATED RINGERS IV SOLN
INTRAVENOUS | Status: DC
  Administered 2014-03-26: 12:00:00 via INTRAVENOUS

## 2014-03-26 MED ORDER — PROMETHAZINE HCL 25 MG/ML IJ SOLN: 6.2500 mg | INTRAMUSCULAR | Status: DC | PRN

## 2014-03-26 MED ORDER — BUPIVACAINE HCL (PF) 0.25 % IJ SOLN
INTRAMUSCULAR | Status: DC | PRN
  Administered 2014-03-26: 5 mL

## 2014-03-26 MED ORDER — LEVOTHYROXINE SODIUM 88 MCG PO TABS
88.0000 ug | ORAL_TABLET | Freq: Every day | ORAL | Status: DC
  Administered 2014-03-27: 88 ug via ORAL
  Filled 2014-03-26 (×2): qty 1

## 2014-03-26 MED ORDER — OXYCODONE HCL 5 MG/5ML PO SOLN: 5.0000 mg | Freq: Once | ORAL | Status: DC | PRN

## 2014-03-26 MED ORDER — MIDAZOLAM HCL 5 MG/5ML IJ SOLN
INTRAMUSCULAR | Status: DC | PRN
  Administered 2014-03-26: 1 mg via INTRAVENOUS

## 2014-03-26 MED ORDER — FENTANYL CITRATE 0.05 MG/ML IJ SOLN
INTRAMUSCULAR | Status: AC
  Filled 2014-03-26: qty 5

## 2014-03-26 MED ORDER — HEMOSTATIC AGENTS (NO CHARGE) OPTIME
TOPICAL | Status: DC | PRN
  Administered 2014-03-26: 1 via TOPICAL

## 2014-03-26 MED ORDER — LIDOCAINE HCL (CARDIAC) 20 MG/ML IV SOLN
INTRAVENOUS | Status: DC | PRN
  Administered 2014-03-26: 50 mg via INTRAVENOUS

## 2014-03-26 SURGICAL SUPPLY — 56 items
ALLOGRAFT TRIAD LORDOTIC CC (Bone Implant) IMPLANT
APL SKNCLS STERI-STRIP NONHPOA (GAUZE/BANDAGES/DRESSINGS) ×1
BAG DECANTER FOR FLEXI CONT (MISCELLANEOUS) ×3 IMPLANT
BENZOIN TINCTURE PRP APPL 2/3 (GAUZE/BANDAGES/DRESSINGS) ×3 IMPLANT
BONE MATRIX OSTEOCEL PRO SM (Bone Implant) ×2 IMPLANT
BUR MATCHSTICK NEURO 3.0 LAGG (BURR) ×3 IMPLANT
CAGE COROENT SM 6X13X15 (Cage) ×2 IMPLANT
CANISTER SUCT 3000ML PPV (MISCELLANEOUS) ×3 IMPLANT
CLOSURE WOUND 1/2 X4 (GAUZE/BANDAGES/DRESSINGS) ×1
CONT SPEC 4OZ CLIKSEAL STRL BL (MISCELLANEOUS) ×3 IMPLANT
DRAPE C-ARM 42X72 X-RAY (DRAPES) ×6 IMPLANT
DRAPE LAPAROTOMY 100X72 PEDS (DRAPES) ×3 IMPLANT
DRAPE MICROSCOPE LEICA (MISCELLANEOUS) ×3 IMPLANT
DRAPE POUCH INSTRU U-SHP 10X18 (DRAPES) ×3 IMPLANT
DRILL BIT HELIX (BIT) ×2 IMPLANT
DRSG OPSITE 4X5.5 SM (GAUZE/BANDAGES/DRESSINGS) ×3 IMPLANT
DRSG OPSITE POSTOP 3X4 (GAUZE/BANDAGES/DRESSINGS) ×2 IMPLANT
DRSG TELFA 3X8 NADH (GAUZE/BANDAGES/DRESSINGS) ×3 IMPLANT
DURAPREP 6ML APPLICATOR 50/CS (WOUND CARE) ×3 IMPLANT
ELECT COATED BLADE 2.86 ST (ELECTRODE) ×3 IMPLANT
ELECT REM PT RETURN 9FT ADLT (ELECTROSURGICAL) ×3
ELECTRODE REM PT RTRN 9FT ADLT (ELECTROSURGICAL) ×1 IMPLANT
GAUZE SPONGE 4X4 16PLY XRAY LF (GAUZE/BANDAGES/DRESSINGS) IMPLANT
GLOVE BIO SURGEON STRL SZ8 (GLOVE) ×3 IMPLANT
GLOVE BIOGEL PI IND STRL 7.0 (GLOVE) IMPLANT
GLOVE BIOGEL PI INDICATOR 7.0 (GLOVE) ×2
GLOVE SS N UNI LF 7.0 STRL (GLOVE) ×6 IMPLANT
GOWN STRL REUS W/ TWL LRG LVL3 (GOWN DISPOSABLE) IMPLANT
GOWN STRL REUS W/ TWL XL LVL3 (GOWN DISPOSABLE) IMPLANT
GOWN STRL REUS W/TWL 2XL LVL3 (GOWN DISPOSABLE) ×1 IMPLANT
GOWN STRL REUS W/TWL LRG LVL3 (GOWN DISPOSABLE) ×3
GOWN STRL REUS W/TWL XL LVL3 (GOWN DISPOSABLE) ×6
HEMOSTAT POWDER KIT SURGIFOAM (HEMOSTASIS) ×5 IMPLANT
KIT BASIN OR (CUSTOM PROCEDURE TRAY) ×3 IMPLANT
KIT ROOM TURNOVER OR (KITS) ×3 IMPLANT
NDL HYPO 25X1 1.5 SAFETY (NEEDLE) ×1 IMPLANT
NDL SPNL 20GX3.5 QUINCKE YW (NEEDLE) ×1 IMPLANT
NEEDLE HYPO 25X1 1.5 SAFETY (NEEDLE) ×3 IMPLANT
NEEDLE SPNL 20GX3.5 QUINCKE YW (NEEDLE) ×3 IMPLANT
NS IRRIG 1000ML POUR BTL (IV SOLUTION) ×3 IMPLANT
PACK LAMINECTOMY NEURO (CUSTOM PROCEDURE TRAY) ×3 IMPLANT
PAD ARMBOARD 7.5X6 YLW CONV (MISCELLANEOUS) ×3 IMPLANT
PAD DRESSING TELFA 3X8 NADH (GAUZE/BANDAGES/DRESSINGS) ×1 IMPLANT
PLATE ARCHON 1-LEVEL 22MM (Plate) ×2 IMPLANT
RUBBERBAND STERILE (MISCELLANEOUS) ×6 IMPLANT
SCREW ARCHON SELFTAP 4.0X13 (Screw) ×8 IMPLANT
SPONGE INTESTINAL PEANUT (DISPOSABLE) ×3 IMPLANT
SPONGE SURGIFOAM ABS GEL SZ50 (HEMOSTASIS) ×3 IMPLANT
STRIP CLOSURE SKIN 1/2X4 (GAUZE/BANDAGES/DRESSINGS) ×2 IMPLANT
SUT VIC AB 3-0 SH 8-18 (SUTURE) ×3 IMPLANT
SYR 20ML ECCENTRIC (SYRINGE) ×3 IMPLANT
TAPE STRIPS DRAPE STRL (GAUZE/BANDAGES/DRESSINGS) ×2 IMPLANT
TOWEL OR 17X24 6PK STRL BLUE (TOWEL DISPOSABLE) ×3 IMPLANT
TOWEL OR 17X26 10 PK STRL BLUE (TOWEL DISPOSABLE) ×3 IMPLANT
TRAP SPECIMEN MUCOUS 40CC (MISCELLANEOUS) IMPLANT
WATER STERILE IRR 1000ML POUR (IV SOLUTION) ×3 IMPLANT

## 2014-03-26 NOTE — Anesthesia Preprocedure Evaluation (Signed)
Anesthesia Evaluation  Patient identified by MRN, date of birth, ID band Patient awake    Reviewed: Allergy & Precautions, NPO status , Patient's Chart, lab work & pertinent test results  History of Anesthesia Complications Negative for: history of anesthetic complications  Airway Mallampati: I       Dental  (+) Edentulous Upper, Edentulous Lower   Pulmonary neg pulmonary ROS,  breath sounds clear to auscultation        Cardiovascular hypertension, Rhythm:Regular Rate:Normal     Neuro/Psych  Headaches,  Neuromuscular disease    GI/Hepatic negative GI ROS, Neg liver ROS,   Endo/Other  Hypothyroidism Hyperthyroidism   Renal/GU      Musculoskeletal  (+) Arthritis -,   Abdominal   Peds  Hematology   Anesthesia Other Findings   Reproductive/Obstetrics                             Anesthesia Physical Anesthesia Plan  ASA: II  Anesthesia Plan: General   Post-op Pain Management:    Induction:   Airway Management Planned: Oral ETT  Additional Equipment:   Intra-op Plan:   Post-operative Plan: Extubation in OR  Informed Consent:   Plan Discussed with:   Anesthesia Plan Comments:         Anesthesia Quick Evaluation

## 2014-03-26 NOTE — Op Note (Signed)
03/26/2014  3:42 PM  PATIENT:  [name]  64 y.o. 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 Decompressive anterior cervical discectomy C5-6, 2. Anterior cervical arthrodesis C5-6 utilizing a peek interbody cage packed with local autograft and morcellized allograft, 3. Anterior cervical plating C5-6 utilizing a Nuvasive Archon plate  SURGEON:  Sherley Bounds, MD  ASSISTANTS: Dr. Arnoldo Morale  ANESTHESIA:   General  EBL: Less than 50 ml  Total I/O In: 1600 [I.V.:1600] Out: -   BLOOD ADMINISTERED:none  DRAINS: None   SPECIMEN:  No Specimen  INDICATION FOR PROCEDURE: This patient presented with a long history of neck and right arm pain. MRI showed focal kyphosis with cervical disc herniation at C5-6. She tried medical management without relief. I recommended ACDF with plating. Patient understood the risks, benefits, and alternatives and potential outcomes and wished to proceed.  PROCEDURE DETAILS: Patient was brought to the operating room placed under general endotracheal anesthesia. Patient was placed in the supine position on the operating room table. The neck was prepped with Duraprep and draped in a sterile fashion.   Three cc of local anesthesia was injected and a transverse incision was made on the right side of the neck.  Dissection was carried down thru the subcutaneous tissue and the platysma was  elevated, opened, and undermined with Metzenbaum scissors.  Dissection was then carried out thru an avascular plane leaving the sternocleidomastoid carotid artery and jugular vein laterally and the trachea and esophagus medially. The ventral aspect of the vertebral column was identified and a localizing x-ray was taken. The C5-6 level was identified. The longus colli muscles were then elevated and the retractor was placed. The annulus  was incised and the disc space entered. Discectomy was performed with micro-curettes and pituitary rongeurs. I then used the high-speed drill to drill the endplates down to the level of the posterior longitudinal ligament. The drill shavings were saved in a mucous trap for later arthrodesis. The operating microscope was draped and brought into the field provided additional magnification, illumination and visualization. Discectomy was continued posteriorly thru the disc space. Posterior longitudinal ligament was opened with a nerve hook, and then removed along with disc herniation and osteophytes, decompressing the spinal canal and thecal sac. We then continued to remove osteophytic overgrowth and disc material decompressing the neural foramina and exiting nerve roots bilaterally. The scope was angled up and down to help decompress and undercut the vertebral bodies. Once the decompression was completed we could pass a nerve hook circumferentially to assure adequate decompression in the midline and in the neural foramina. So by both visualization and palpation we felt we had an adequate decompression of the neural elements. We then measured the height of the intravertebral disc space and selected a 6 millimeter Peek interbody cage packed with autograft and morcellized allograft. It was then gently positioned in the intravertebral disc space and countersunk. I then used a Archon plate and placed four variable angle screws into the vertebral bodies and locked them into position. The wound was irrigated with bacitracin solution, checked for hemostasis which was established and confirmed. Once meticulous hemostasis was achieved, we then proceeded with closure. The platysma was closed with interrupted 3-0 undyed Vicryl suture, the subcuticular layer was closed with interrupted 3-0 undyed Vicryl suture. The skin edges were approximated with steristrips. The drapes were removed. A sterile dressing was  applied. The patient  was then awakened from general anesthesia and transferred to the recovery room in stable condition. At the end of the procedure all sponge, needle and instrument counts were correct.   PLAN OF CARE: Admit for overnight observation  PATIENT DISPOSITION:  PACU - hemodynamically stable.   Delay start of Pharmacological VTE agent (>24hrs) due to surgical blood loss or risk of bleeding:  yes

## 2014-03-26 NOTE — Anesthesia Postprocedure Evaluation (Signed)
  Anesthesia Post-op Note  Patient: [name]  Procedure(s) Performed: Procedure(s) with comments: ANTERIOR CERVICAL DECOMPRESSION/DISCECTOMY FUSION 1 LEVEL (N/A) - ANTERIOR CERVICAL DECOMPRESSION/DISCECTOMY FUSION 1 LEVEL CERVICAL 5-6  Patient Location: PACU  Anesthesia Type:General  Level of Consciousness: awake and sedated  Airway and Oxygen Therapy: Patient Spontanous Breathing  Post-op Pain: mild  Post-op Assessment: Post-op Vital signs reviewed  Post-op Vital Signs: stable  Last Vitals:  Filed Vitals:   03/26/14 1645  BP: 127/64  Pulse: 74  Temp:   Resp: 19    Complications: No apparent anesthesia complications

## 2014-03-26 NOTE — Transfer of Care (Signed)
Immediate Anesthesia Transfer of Care Note  Patient: [name]  Procedure(s) Performed: Procedure(s) with comments: ANTERIOR CERVICAL DECOMPRESSION/DISCECTOMY FUSION 1 LEVEL (N/A) - ANTERIOR CERVICAL DECOMPRESSION/DISCECTOMY FUSION 1 LEVEL CERVICAL 5-6  Patient Location: PACU  Anesthesia Type:General  Level of Consciousness: awake and alert   Airway & Oxygen Therapy: Patient Spontanous Breathing and Patient connected to nasal cannula oxygen  Post-op Assessment: Report given to RN and Post -op Vital signs reviewed and stable  Post vital signs: Reviewed and stable  Last Vitals:  Filed Vitals:   03/26/14 1148  BP: 149/68  Pulse: 76  Temp: 36.7 C  Resp: 20    Complications: No apparent anesthesia complications

## 2014-03-26 NOTE — H&P (Signed)
Subjective:   Patient is a 64 y.o. female admitted for ACDF. The patient first presented to me with complaints of neck pain and arm pain. Onset of symptoms was several months ago. The pain is described as aching and occurs all day. The pain is rated severe, and is located  at the base of the neck and radiates to the arm. The symptoms have been progressive. Symptoms are exacerbated by extending head backwards, and are relieved by none.  Previous work up includes MRI of cervical spine, results: spinal stenosis.  Past Medical History  Diagnosis Date  . Hypercholesteremia     takes Crestor daily  . Vertigo     doesn't take any meds  . Neck pain 05/13/2012    HNP  . Hypertension     takes HCTZ daily  . Hypothyroid     takes Synthroid daily  . Insomnia     takes Ambien nightly as needed  . Headache(784.0)     occasionally  . Neuropathy   . Arthritis   . Constipation     doesn't take any medss  . History of colon polyps   . Diverticulosis   . History of bladder infections   . History of blood transfusion     no abnormal reaction noted  . Hx of radiation therapy 05/13/13- 06/16/13    right breast 5000 cGy in 25 sessions  . Breast cancer 02/19/13  . Chronic back pain     reason unknown    Past Surgical History  Procedure Laterality Date  . Thyroid surgery    . Cholecystectomy    . Esophagogastroduodenoscopy  09/17/2008    RMR:Two mid esophageal diverticula/Small benign cystic mucosal lesions distal esophagus of doubtful  clinical significance, stable for least 5 years/ Small hiatal hernia, otherwise normal stomach D1, D2  . Colonoscopy  09/17/2008    Q[facility]:ALPF tortuous, but otherwise normal-appearing colon/. Scattered diverticula  . Esophagogastroduodenoscopy    02/10/2003    XTK:WIOXBD esophageal 3 cystic lesions without luminal compromise or evidence/Nonerosive antral gastritis/The esophagus was dilated by passing 56 [place] Maloney dilator.Marland Kitchen Epic notes states +H.pylori gastritis.  treatment completed per epic notes.   . Eus  Aug 2011    Dr. Newman Pies: EGD with multiple lower esophageal submucosal nodules, stomach and duodenum normal, EUS with esophageal duplication cysts  . Esophagogastroduodenoscopy (egd) with esophageal dilation N/A 02/03/2013    ZHG:DJMEQA esophageal duplication cyst. Small esophageal diverticulum. Otherwise;  EGD normal - Widely patent tubular esophagus before and after dilation.  Status post passage of  a  Maloney dilator.  . Abdominal hysterectomy      partial  . Breast lumpectomy with needle localization and axillary sentinel lymph node bx Right 04/03/2013    Procedure: BREAST LUMPECTOMY WITH NEEDLE LOCALIZATION AND AXILLARY SENTINEL LYMPH NODE BX;  Surgeon: Merrie Roof, MD;  Location: [facility];  Service: General;  Laterality: Right;    Allergies  Allergen Reactions  . Codeine Nausea And Vomiting  . Percocet [Oxycodone-Acetaminophen] Rash    History  Substance Use Topics  . Smoking status: Never Smoker   . Smokeless tobacco: Never Used  . Alcohol Use: No    Family History  Problem Relation Age of Onset  . Lupus Sister   . Hypertension Brother   . Diabetes Brother   . Hypertension Brother   . Diabetes Brother   . Multiple sclerosis Mother   . Cancer Father   . Colon cancer Neg Hx    Prior to Admission medications  Medication Sig Start Date End Date Taking? Authorizing Provider  acetaminophen (TYLENOL) 650 MG CR tablet Take 1,300 mg by mouth daily as needed for pain.   Yes Historical Provider, MD  aspirin EC 81 MG tablet Take 81 mg by mouth daily.   Yes Historical Provider, MD  benzoyl peroxide-erythromycin (BENZAMYCIN) gel Apply 1 application topically 2 (two) times daily.  10/04/13  Yes Historical Provider, MD  hydrochlorothiazide (HYDRODIURIL) 25 MG tablet Take 25 mg by mouth daily.     Yes Historical Provider, MD  ketoconazole (NIZORAL) 2 % cream Apply 1 application topically daily as needed (athlete's foot).  07/07/13  Yes Historical  Provider, MD  levothyroxine (SYNTHROID, LEVOTHROID) 88 MCG tablet Take 88 mcg by mouth daily.     Yes Historical Provider, MD  rosuvastatin (CRESTOR) 5 MG tablet Take 2.5 mg by mouth at bedtime.    Yes Historical Provider, MD  fluticasone (FLONASE) 50 MCG/ACT nasal spray Place 50 sprays into the nose daily as needed for allergies.  07/03/12   Historical Provider, MD  HYDROcodone-acetaminophen (NORCO/VICODIN) 5-325 MG per tablet Take 1-2 tablets by mouth every 4 (four) hours as needed. Patient not taking: Reported on 03/20/2014 04/03/13   Autumn Messing III, MD  tiZANidine (ZANAFLEX) 4 MG tablet Take 1 tablet (4 mg total) by mouth at bedtime. Patient taking differently: Take 4 mg by mouth at bedtime as needed for muscle spasms.  11/07/13   Ward Givens, NP  zolpidem (AMBIEN) 5 MG tablet Take 5 mg by mouth at bedtime as needed for sleep.  01/21/14   Historical Provider, MD     Review of Systems  Positive ROS: neg  All other systems have been reviewed and were otherwise negative with the exception of those mentioned in the HPI and as above.  Objective: Vital signs in last 24 hours: Temp:  [98 F (36.7 C)] 98 F (36.7 C) (02/25 1148) Pulse Rate:  [76] 76 (02/25 1148) Resp:  [20] 20 (02/25 1148) BP: (149)/(68) 149/68 mmHg (02/25 1148) SpO2:  [99 %] 99 % (02/25 1148) Weight:  [173 lb (78.472 kg)] 173 lb (78.472 kg) (02/25 1148)  General Appearance: Alert, cooperative, no distress, appears stated age Head: Normocephalic, without obvious abnormality, atraumatic Eyes: PERRL, conjunctiva/corneas clear, EOM's intact      Neck: Supple, symmetrical, trachea midline, Back: Symmetric, no curvature, ROM normal, no CVA tenderness Lungs:  respirations unlabored Heart: Regular rate and rhythm Abdomen: Soft, non-tender Extremities: Extremities normal, atraumatic, no cyanosis or edema Pulses: 2+ and symmetric all extremities Skin: Skin color, texture, turgor normal, no rashes or  lesions  NEUROLOGIC:  Mental status: Alert and oriented x4, no aphasia, good attention span, fund of knowledge and memory  Motor Exam - grossly normal Sensory Exam - grossly normal Reflexes: 1= Coordination - grossly normal Gait - grossly normal Balance - grossly normal Cranial Nerves: I: smell Not tested  II: visual acuity  OS: nl    OD: nl  II: visual fields Full to confrontation  II: pupils Equal, round, reactive to light  III,VII: ptosis None  III,IV,VI: extraocular muscles  Full ROM  V: mastication Normal  V: facial light touch sensation  Normal  V,VII: corneal reflex  Present  VII: facial muscle function - upper  Normal  VII: facial muscle function - lower Normal  VIII: hearing Not tested  IX: soft palate elevation  Normal  IX,X: gag reflex Present  XI: trapezius strength  5/5  XI: sternocleidomastoid strength 5/5  XI: neck flexion strength  5/5  XII: tongue strength  Normal    Data Review Lab Results  Component Value Date   WBC 5.7 03/23/2014   HGB 11.8* 03/23/2014   HCT 37.0 03/23/2014   MCV 86.2 03/23/2014   PLT 270 03/23/2014   Lab Results  Component Value Date   NA 139 03/23/2014   K 3.8 03/23/2014   CL 105 03/23/2014   CO2 26 03/23/2014   BUN 13 03/23/2014   CREATININE 1.00 03/23/2014   GLUCOSE 98 03/23/2014   Lab Results  Component Value Date   INR 0.97 03/23/2014    Assessment:   Cervical neck pain with herniated nucleus pulposus/ spondylosis/ stenosis at C5-6. Patient has failed conservative therapy. Planned surgery : ACDF with plate C5-6  Plan:   I explained the condition and procedure to the patient and answered any questions.  Patient wishes to proceed with procedure as planned. Understands risks/ benefits/ and expected or typical outcomes.  JONES,DAVID S 03/26/2014 1:08 PM

## 2014-03-26 NOTE — Plan of Care (Signed)
Problem: Consults Goal: Diagnosis - Spinal Surgery Outcome: Completed/Met Date Met:  03/26/14 Cervical Spine Fusion     

## 2014-03-26 NOTE — Anesthesia Procedure Notes (Signed)
Procedure Name: Intubation Date/Time: 03/19/2014 2:07 PM Performed by: Eligha Bridegroom Pre-anesthesia Checklist: Patient identified, Timeout performed, Emergency Drugs available, Suction available and Patient being monitored Patient Re-evaluated:Patient Re-evaluated prior to inductionOxygen Delivery Method: Circle system utilized Preoxygenation: Pre-oxygenation with 100% oxygen Intubation Type: IV induction Ventilation: Mask ventilation without difficulty and Oral airway inserted - appropriate to patient size Laryngoscope Size: Mac and 3 Grade View: Grade I Tube type: Oral Tube size: 7.0 mm Number of attempts: 1 Airway Equipment and Method: Stylet and LTA kit utilized Placement Confirmation: ETT inserted through vocal cords under direct vision,  breath sounds checked- equal and bilateral and positive ETCO2 Secured at: 21 cm Tube secured with: Tape Dental Injury: Teeth and Oropharynx as per pre-operative assessment

## 2014-03-27 DIAGNOSIS — M47892 Other spondylosis, cervical region: Secondary | ICD-10-CM | POA: Diagnosis not present

## 2014-03-27 MED ORDER — HYDROMORPHONE HCL 2 MG PO TABS
2.0000 mg | ORAL_TABLET | ORAL | Status: DC | PRN
Start: 2014-03-27 — End: 2014-08-24

## 2014-03-27 NOTE — Progress Notes (Signed)
Patient alert and oriented, mae's well, voiding adequate amount of urine, swallowing without difficulty, no c/o pain. Patient discharged home with family. Script and discharged instructions given to patient. Patient and family stated understanding of d/c instructions given and has an appointment with MD. Aisha Starletta Houchin RN 

## 2014-03-27 NOTE — Discharge Summary (Signed)
Physician Discharge Summary  Patient ID: [name] MRN: [id number] DOB/AGE: 1950-07-06 64 y.o.  Admit date: 03/26/2014 Discharge date: 03/27/2014  Admission Diagnoses: C5-6 herniated disc, cervicalgia, cervical radiculopathy  Discharge Diagnoses: The same Active Problems:   S/P cervical spinal fusion   Discharged Condition: good  Hospital Course: Dr. Ronnald Ramp performed a C5-6 anterior cervical discectomy, fusion, and plating on the patient on 03/26/2014.  The patient's postoperative course was unremarkable. On 03/27/2014 the patient requested discharge to home. She was given oral and written discharge instructions. All her questions were answered.  Consults: None Significant Diagnostic Studies: None Treatments: C5-6 anterior cervical discectomy, fusion, and plating. Discharge Exam: Blood pressure 110/51, pulse 72, temperature 99.1 F (37.3 C), temperature source Oral, resp. rate 18, height 5\' 4"  (1.626 m), weight 78.472 kg (173 lb), SpO2 97 %. The patient is alert and pleasant. She is in no apparent distress. She looks well. Her dressing is clean and dry. There is no hematoma or shift. Her strength is normal in all 4 extremities.  Disposition: Home  Discharge Instructions    Call MD for:  difficulty breathing, headache or visual disturbances    Complete by:  As directed      Call MD for:  extreme fatigue    Complete by:  As directed      Call MD for:  hives    Complete by:  As directed      Call MD for:  persistant dizziness or light-headedness    Complete by:  As directed      Call MD for:  persistant nausea and vomiting    Complete by:  As directed      Call MD for:  redness, tenderness, or signs of infection (pain, swelling, redness, odor or green/yellow discharge around incision site)    Complete by:  As directed      Call MD for:  severe uncontrolled pain    Complete by:  As directed      Call MD for:  temperature >100.4    Complete by:  As directed      Diet -  low sodium heart healthy    Complete by:  As directed      Discharge instructions    Complete by:  As directed   Call [phone] for a followup appointment. Take a stool softener while you are using pain medications.     Driving Restrictions    Complete by:  As directed   Do not drive for 2 weeks.     Increase activity slowly    Complete by:  As directed      Lifting restrictions    Complete by:  As directed   Do not lift more than 5 pounds. No excessive bending or twisting.     May shower / Bathe    Complete by:  As directed   He may shower after the pain she is removed 3 days after surgery. Leave the incision alone.     Remove dressing in 48 hours    Complete by:  As directed   Your stitches are under the scan and will dissolve by themselves. The Steri-Strips will fall off after you take a few showers. Do not rub back or pick at the wound, Leave the wound alone.            Medication List    STOP taking these medications        acetaminophen 650 MG CR tablet  Commonly known as:  TYLENOL     HYDROcodone-acetaminophen 5-325 MG per tablet  Commonly known as:  NORCO/VICODIN      TAKE these medications        aspirin EC 81 MG tablet  Take 81 mg by mouth daily.     benzoyl peroxide-erythromycin gel  Commonly known as:  BENZAMYCIN  Apply 1 application topically 2 (two) times daily.     fluticasone 50 MCG/ACT nasal spray  Commonly known as:  FLONASE  Place 50 sprays into the nose daily as needed for allergies.     hydrochlorothiazide 25 MG tablet  Commonly known as:  HYDRODIURIL  Take 25 mg by mouth daily.     HYDROmorphone 2 MG tablet  Commonly known as:  DILAUDID  Take 1 tablet (2 mg total) by mouth every 4 (four) hours as needed for severe pain.     ketoconazole 2 % cream  Commonly known as:  NIZORAL  Apply 1 application topically daily as needed (athlete's foot).     levothyroxine 88 MCG tablet  Commonly known as:  SYNTHROID, LEVOTHROID  Take 88 mcg by  mouth daily.     rosuvastatin 5 MG tablet  Commonly known as:  CRESTOR  Take 2.5 mg by mouth at bedtime.     tiZANidine 4 MG tablet  Commonly known as:  ZANAFLEX  Take 1 tablet (4 mg total) by mouth at bedtime.     zolpidem 5 MG tablet  Commonly known as:  AMBIEN  Take 5 mg by mouth at bedtime as needed for sleep.         SignedOphelia Charter 03/27/2014, 7:13 AM

## 2014-03-27 NOTE — Progress Notes (Signed)
Orthopedic Tech Progress Note Patient Details:  [name] 1950-11-15 [id number] Soft cervical collar      Hildred Priest 03/27/2014, 10:05 AM

## 2014-03-27 NOTE — Progress Notes (Signed)
Orthopedic Tech Progress Note Patient Details:  [name] 1951/01/09 [id number]  Patient ID: [name], female   DOB: 17-Jul-1950, 64 y.o.   MRN: [id number] One ortho tech visit has been deleted  Hildred Priest 03/27/2014, 10:07 AM

## 2014-03-30 ENCOUNTER — Encounter (HOSPITAL_COMMUNITY): Payer: Self-pay | Admitting: Neurological Surgery

## 2014-04-07 ENCOUNTER — Ambulatory Visit (HOSPITAL_COMMUNITY)
Admission: RE | Admit: 2014-04-07 | Discharge: 2014-04-07 | Disposition: A | Discharge status: Home / Self Care | Payer: Medicare PPO | Source: Ambulatory Visit | Attending: Neurological Surgery | Admitting: Neurological Surgery

## 2014-04-07 ENCOUNTER — Other Ambulatory Visit (HOSPITAL_COMMUNITY): Payer: Self-pay | Admitting: Neurological Surgery

## 2014-04-07 DIAGNOSIS — M25569 Pain in unspecified knee: Secondary | ICD-10-CM

## 2014-04-07 DIAGNOSIS — M79606 Pain in leg, unspecified: Secondary | ICD-10-CM | POA: Insufficient documentation

## 2014-04-07 DIAGNOSIS — M79609 Pain in unspecified limb: Secondary | ICD-10-CM | POA: Diagnosis not present

## 2014-04-07 NOTE — Progress Notes (Signed)
VASCULAR LAB PRELIMINARY  PRELIMINARY  PRELIMINARY  PRELIMINARY  BLEV completed.    Preliminary report:  Negative DVT bilaterally.  Negative Baker's Cyst bilaterally.  August Albino, RVT 04/07/2014, 3:06 PM

## 2014-05-12 ENCOUNTER — Telehealth: Payer: Self-pay | Admitting: Hematology and Oncology

## 2014-05-12 ENCOUNTER — Ambulatory Visit (HOSPITAL_BASED_OUTPATIENT_CLINIC_OR_DEPARTMENT_OTHER): Payer: Medicare PPO | Admitting: Hematology and Oncology

## 2014-05-12 VITALS — BP 134/61 | HR 71 | Temp 98.5°F | Resp 18 | Ht 64.0 in | Wt 169.6 lb

## 2014-05-12 DIAGNOSIS — Z171 Estrogen receptor negative status [ER-]: Secondary | ICD-10-CM

## 2014-05-12 DIAGNOSIS — D0511 Intraductal carcinoma in situ of right breast: Secondary | ICD-10-CM

## 2014-05-12 DIAGNOSIS — C50411 Malignant neoplasm of upper-outer quadrant of right female breast: Secondary | ICD-10-CM

## 2014-05-12 NOTE — Progress Notes (Signed)
Patient Care Team: Asencion Noble, MD as PCP - General (Internal Medicine)  DIAGNOSIS: Breast cancer of upper-outer quadrant of right female breast   Staging form: Breast, AJCC 7th Edition     Clinical: Stage 0 (Tis (DCIS), N0, cM0) - Unsigned       Staging comments: Staged at breast conference 03/05/13      Pathologic: No stage assigned - Unsigned   SUMMARY OF ONCOLOGIC HISTORY:   Breast cancer of upper-outer quadrant of right female breast   04/03/2013 Surgery Right breast lumpectomy: High-grade DCIS with comedonecrosis, 2 SLN negative; reresection of margins benign: ER negative PR negative   05/19/2013 - 07/07/2013 Radiation Therapy Adjuvant radiation    CHIEF COMPLIANT: follow-up of breast cancer  INTERVAL HISTORY: [name] is a 64 year old above-mentioned history of right breast DCIS underwent lumpectomy and radiation. Because she is ER/PR negative she did not go on antiestrogen therapy. She is here for a six-month follow-up. She reports no new problems or concerns. She recently underwent a cervical disc surgery and since then she is doing much better with the pain in the right arm. She also experiencing less pain in the right breast. Recent mammogram was normal.  REVIEW OF SYSTEMS:   Constitutional: Denies fevers, chills or abnormal weight loss Eyes: Denies blurriness of vision Ears, nose, mouth, throat, and face: Denies mucositis or sore throat Respiratory: Denies cough, dyspnea or wheezes Cardiovascular: Denies palpitation, chest discomfort or lower extremity swelling Gastrointestinal:  Denies nausea, heartburn or change in bowel habits Skin: Denies abnormal skin rashes Lymphatics: Denies new lymphadenopathy or easy bruising Neurological:Denies numbness, tingling or new weaknesses Behavioral/Psych: Mood is stable, no new changes  Breast:  denies any pain or lumps or nodules in either breasts All other systems were reviewed with the patient and are negative.  I have reviewed the  past medical history, past surgical history, social history and family history with the patient and they are unchanged from previous note.  ALLERGIES:  is allergic to codeine and percocet.  MEDICATIONS:  Current Outpatient Prescriptions  Medication Sig Dispense Refill  . aspirin EC 81 MG tablet Take 81 mg by mouth daily.    . benzoyl peroxide-erythromycin (BENZAMYCIN) gel Apply 1 application topically 2 (two) times daily.     . fluticasone (FLONASE) 50 MCG/ACT nasal spray Place 50 sprays into the nose daily as needed for allergies.     . hydrochlorothiazide (HYDRODIURIL) 25 MG tablet Take 25 mg by mouth daily.      Marland Kitchen HYDROmorphone (DILAUDID) 2 MG tablet Take 1 tablet (2 mg total) by mouth every 4 (four) hours as needed for severe pain. 100 tablet 0  . ketoconazole (NIZORAL) 2 % cream Apply 1 application topically daily as needed (athlete's foot).     Marland Kitchen levothyroxine (SYNTHROID, LEVOTHROID) 88 MCG tablet Take 88 mcg by mouth daily.      . pantoprazole (PROTONIX) 40 MG tablet     . rosuvastatin (CRESTOR) 5 MG tablet Take 2.5 mg by mouth at bedtime.     Marland Kitchen tiZANidine (ZANAFLEX) 4 MG tablet Take 1 tablet (4 mg total) by mouth at bedtime. (Patient taking differently: Take 4 mg by mouth at bedtime as needed for muscle spasms. ) 30 tablet 0  . zolpidem (AMBIEN) 5 MG tablet Take 5 mg by mouth at bedtime as needed for sleep.   3   No current facility-administered medications for this visit.    PHYSICAL EXAMINATION: ECOG PERFORMANCE STATUS: 0 - Asymptomatic  Filed Vitals:  05/12/14 1125  BP: 134/61  Pulse: 71  Temp: 98.5 F (36.9 C)  Resp: 18   Filed Weights   05/12/14 1125  Weight: 169 lb 9.6 oz (76.93 kg)    GENERAL:alert, no distress and comfortable SKIN: skin color, texture, turgor are normal, no rashes or significant lesions EYES: normal, Conjunctiva are pink and non-injected, sclera clear OROPHARYNX:no exudate, no erythema and lips, buccal mucosa, and tongue normal  NECK:  supple, thyroid normal size, non-tender, without nodularity LYMPH:  no palpable lymphadenopathy in the cervical, axillary or inguinal LUNGS: clear to auscultation and percussion with normal breathing effort HEART: regular rate & rhythm and no murmurs and no lower extremity edema ABDOMEN:abdomen soft, non-tender and normal bowel sounds Musculoskeletal:no cyanosis of digits and no clubbing  NEURO: alert & oriented x 3 with fluent speech, no focal motor/sensory deficits BREAST: No palpable masses or nodules in either right or left breasts. No palpable axillary supraclavicular or infraclavicular adenopathy no breast tenderness or nipple discharge. (exam performed in the presence of a chaperone)  LABORATORY DATA:  I have reviewed the data as listed   Chemistry      Component Value Date/Time   NA 139 03/23/2014 1301   NA 141 03/05/2013 0818   K 3.8 03/23/2014 1301   K 3.1* 03/05/2013 0818   CL 105 03/23/2014 1301   CO2 26 03/23/2014 1301   CO2 28 03/05/2013 0818   BUN 13 03/23/2014 1301   BUN 8.6 03/05/2013 0818   CREATININE 1.00 03/23/2014 1301   CREATININE 0.8 03/05/2013 0818      Component Value Date/Time   CALCIUM 9.2 03/23/2014 1301   CALCIUM 9.3 03/05/2013 0818   ALKPHOS 65 03/05/2013 0818   ALKPHOS 65 11/20/2012 1609   AST 18 03/05/2013 0818   AST 21 11/20/2012 1609   ALT 17 03/05/2013 0818   ALT 15 11/20/2012 1609   BILITOT 0.51 03/05/2013 0818   BILITOT 0.4 11/20/2012 1609       Lab Results  Component Value Date   WBC 5.7 03/23/2014   HGB 11.8* 03/23/2014   HCT 37.0 03/23/2014   MCV 86.2 03/23/2014   PLT 270 03/23/2014   NEUTROABS 3.5 03/23/2014     RADIOGRAPHIC STUDIES: I have personally reviewed the radiology reports and agreed with their findings. 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 Breast exam  05/12/2014 is normal 2. Mammogram February 2016 is normal   Cervical disc disease: Status post recent surgery for disc prolapse much improvement in the right arm pain and numbness and weakness also improved the breast pain.  Since she had blood work in the hospital we do not need to repeat her blood work today. I discussed with her the blood work will be done once a year. If her primary care physician gets blood work we do not need to repeat it  Return to clinic in 6 months for followup. No orders of the defined types were placed in this encounter.   The patient has a good understanding of the overall plan. she agrees with it. She will call with any problems that may develop before her next visit here.   Rulon Eisenmenger, MD

## 2014-05-12 NOTE — Telephone Encounter (Signed)
Appointments made and avs printed for patient °

## 2014-05-19 ENCOUNTER — Other Ambulatory Visit (HOSPITAL_COMMUNITY): Payer: Self-pay | Admitting: Internal Medicine

## 2014-05-19 DIAGNOSIS — R1013 Epigastric pain: Secondary | ICD-10-CM

## 2014-05-22 ENCOUNTER — Ambulatory Visit (HOSPITAL_COMMUNITY)
Admission: RE | Admit: 2014-05-22 | Discharge: 2014-05-22 | Disposition: A | Discharge status: Home / Self Care | Payer: Medicare PPO | Source: Ambulatory Visit | Attending: Internal Medicine | Admitting: Internal Medicine

## 2014-05-22 DIAGNOSIS — E780 Pure hypercholesterolemia: Secondary | ICD-10-CM | POA: Diagnosis not present

## 2014-05-22 DIAGNOSIS — Z853 Personal history of malignant neoplasm of breast: Secondary | ICD-10-CM | POA: Insufficient documentation

## 2014-05-22 DIAGNOSIS — K921 Melena: Secondary | ICD-10-CM | POA: Insufficient documentation

## 2014-05-22 DIAGNOSIS — I10 Essential (primary) hypertension: Secondary | ICD-10-CM | POA: Diagnosis not present

## 2014-05-22 DIAGNOSIS — R1013 Epigastric pain: Secondary | ICD-10-CM | POA: Insufficient documentation

## 2014-07-13 DIAGNOSIS — D0511 Intraductal carcinoma in situ of right breast: Secondary | ICD-10-CM | POA: Diagnosis not present

## 2014-07-17 ENCOUNTER — Telehealth: Payer: Self-pay | Admitting: Hematology and Oncology

## 2014-07-17 ENCOUNTER — Telehealth: Payer: Self-pay | Admitting: *Deleted

## 2014-07-17 NOTE — Telephone Encounter (Signed)
Patient called and left a message that she is having a problem with her breast,this voicemail was forwarded to triage

## 2014-07-17 NOTE — Telephone Encounter (Signed)
VM message from patient received @ 9:17 am concerning new pain in her right breast-area where she had lumpectomy in 2014 and along incisional site under her right ar. She states it is tender and burning. She did some heavy lifting with that arm not long ago also. She wants to to know if she can see Dr. Lindi Adie. She feels she needs to be seen by someone.

## 2014-07-17 NOTE — Telephone Encounter (Signed)
TC to patient . Advised her to use moist heat and Tylenol as needed.  Pt states she has used aleve and it is still painful.  She saw her surgeon and he told her it could be related to her radiation which she completed >1 year ago. Pt. States the redness and tenderness started 2 weeks ago. Encouraged her to call her PCP. She sttaed she would but really does want to see Dr. Lindi Adie.

## 2014-07-21 ENCOUNTER — Other Ambulatory Visit: Payer: Self-pay | Admitting: *Deleted

## 2014-07-21 ENCOUNTER — Telehealth: Payer: Self-pay | Admitting: Hematology and Oncology

## 2014-07-21 NOTE — Telephone Encounter (Signed)
s.w. pt and advised on 6.22 appt.Marland KitchenMarland KitchenMarland KitchenMarland Kitchenpt ok and aware

## 2014-07-21 NOTE — Telephone Encounter (Signed)
VM message from patient requesting an appt with Dr. Lindi Adie as soon as poosible as she has found an area on her breast that needs to be evaluated by him. Pt. Saw her PCP and he recommends an eval by Dr. Lindi Adie.

## 2014-07-21 NOTE — Telephone Encounter (Signed)
Called and spoke with patient. States that lumpectomy site and axilla have been painful for a couple weeks, has tried warm compresses and Aleve with no relief. States that arm is sore as well. Denies any redness or warmth to arm or breast as well as fever. Will send pof to scheduler to set up appt with Selena Lesser.

## 2014-07-21 NOTE — Telephone Encounter (Signed)
Patient called in and left a message as she needs to be seen for her breast,i have sent this call to triage to be called back

## 2014-07-22 ENCOUNTER — Telehealth: Payer: Self-pay | Admitting: Hematology and Oncology

## 2014-07-22 ENCOUNTER — Encounter: Payer: Self-pay | Admitting: Nurse Practitioner

## 2014-07-22 ENCOUNTER — Ambulatory Visit (HOSPITAL_BASED_OUTPATIENT_CLINIC_OR_DEPARTMENT_OTHER): Payer: Medicare PPO | Admitting: Nurse Practitioner

## 2014-07-22 ENCOUNTER — Other Ambulatory Visit: Payer: Self-pay | Admitting: Nurse Practitioner

## 2014-07-22 VITALS — BP 157/74 | HR 73 | Temp 98.5°F | Resp 16 | Wt 169.3 lb

## 2014-07-22 DIAGNOSIS — C50411 Malignant neoplasm of upper-outer quadrant of right female breast: Secondary | ICD-10-CM | POA: Diagnosis not present

## 2014-07-22 DIAGNOSIS — I890 Lymphedema, not elsewhere classified: Secondary | ICD-10-CM | POA: Diagnosis not present

## 2014-07-22 MED ORDER — TRAMADOL HCL 50 MG PO TABS: 50.0000 mg | ORAL_TABLET | Freq: Four times a day (QID) | ORAL | Status: DC | PRN

## 2014-07-22 NOTE — Telephone Encounter (Signed)
s.w. pt and advised Mammo appt.Marland KitchenMarland KitchenMarland KitchenMarland Kitchenpt ok and aware

## 2014-07-22 NOTE — Progress Notes (Signed)
SYMPTOM MANAGEMENT CLINIC   HPI: [name] 64 y.o. female diagnosed with breast cancer.  Patient is status post right breast lumpectomy in March 2015.  She completed radiation therapy to the right breast area in May 2015.  She underwent her most recent mammogram on 03/17/2014; which revealed no acute findings.  Patient is complaining of recently discovered mass and tenderness to right breast lumpectomy surgical site.  She admits to some heavy lifting within the past several weeks; but otherwise denies any injury or trauma.  Patient states that she follows up with her general surgeon just 2 weeks ago; and was advised that this area of concern was most likely just scar tissue.  HPI  ROS  Past Medical History  Diagnosis Date  . Hypercholesteremia     takes Crestor daily  . Vertigo     doesn't take any meds  . Neck pain 05/13/2012    HNP  . Hypertension     takes HCTZ daily  . Hypothyroid     takes Synthroid daily  . Insomnia     takes Ambien nightly as needed  . Headache(784.0)     occasionally  . Neuropathy   . Arthritis   . Constipation     doesn't take any medss  . History of colon polyps   . Diverticulosis   . History of bladder infections   . History of blood transfusion     no abnormal reaction noted  . Hx of radiation therapy 05/13/13- 06/16/13    right breast 5000 cGy in 25 sessions  . Breast cancer 02/19/13  . Chronic back pain     reason unknown    Past Surgical History  Procedure Laterality Date  . Thyroid surgery    . Cholecystectomy    . Esophagogastroduodenoscopy  09/17/2008    RMR:Two mid esophageal diverticula/Small benign cystic mucosal lesions distal esophagus of doubtful  clinical significance, stable for least 5 years/ Small hiatal hernia, otherwise normal stomach D1, D2  . Colonoscopy  09/17/2008    CLE:XNTZ tortuous, but otherwise normal-appearing colon/. Scattered diverticula  . Esophagogastroduodenoscopy    02/10/2003    GYF:[facility]BS[facility]  esophageal 3 cystic lesions without luminal compromise or evidence/Nonerosive antral gastritis/The esophagus was dilated by passing 56 [place] Maloney dilator.Marland Kitchen Epic notes states +H.pylori gastritis. treatment completed per epic notes.   . Eus  Aug 2011    Dr. Newman Pies: EGD with multiple lower esophageal submucosal nodules, stomach and duodenum normal, EUS with esophageal duplication cysts  . Esophagogastroduodenoscopy (egd) with esophageal dilation N/A 02/03/2013    QPR:FFMBWG esophageal duplication cyst. Small esophageal diverticulum. Otherwise;  EGD normal - Widely patent tubular esophagus before and after dilation.  Status post passage of  a  Maloney dilator.  . Abdominal hysterectomy      partial  . Breast lumpectomy with needle localization and axillary sentinel lymph node bx Right 04/03/2013    Procedure: BREAST LUMPECTOMY WITH NEEDLE LOCALIZATION AND AXILLARY SENTINEL LYMPH NODE BX;  Surgeon: Merrie Roof, MD;  Location: [facility];  Service: General;  Laterality: Right;  . Anterior cervical decomp/discectomy fusion N/A 03/26/2014    Procedure: ANTERIOR CERVICAL DECOMPRESSION/DISCECTOMY FUSION 1 LEVEL;  Surgeon: Eustace Moore, MD;  Location: [facility];  Service: Neurosurgery;  Laterality: N/A;  ANTERIOR CERVICAL DECOMPRESSION/DISCECTOMY FUSION 1 LEVEL CERVICAL 5-6    has HYPERTHYROIDISM; HYPERLIPIDEMIA; HYPERTENSION; OVERACTIVE BLADDER; FIBROCYSTIC BREAST DISEASE; MENOPAUSE-RELATED VASOMOTOR SYMPTOMS, HOT FLASHES; SWEATING; DYSPHAGIA UNSPECIFIED; INSOMNIA, HX OF; Abscess; Menopausal symptoms; Vertigo; Headache(784.0); Neck  pain; Unspecified constipation; Dyspepsia; Breast cancer of upper-outer quadrant of right female breast; DCIS (ductal carcinoma in situ) of breast; Breast cancer; Sprain of neck; Stiffness of joint, not elsewhere classified, other specified site; S/P cervical spinal fusion; and Lymphedema on her problem list.    is allergic to codeine and percocet.    Medication List         This list is accurate as of: 07/22/14 11:17 AM.  Always use your most recent med list.               aspirin EC 81 MG tablet  Take 81 mg by mouth daily.     benzoyl peroxide-erythromycin gel  Commonly known as:  BENZAMYCIN  Apply 1 application topically 2 (two) times daily.     fluticasone 50 MCG/ACT nasal spray  Commonly known as:  FLONASE  Place 50 sprays into the nose daily as needed for allergies.     hydrochlorothiazide 25 MG tablet  Commonly known as:  HYDRODIURIL  Take 25 mg by mouth daily.     HYDROmorphone 2 MG tablet  Commonly known as:  DILAUDID  Take 1 tablet (2 mg total) by mouth every 4 (four) hours as needed for severe pain.     ketoconazole 2 % cream  Commonly known as:  NIZORAL  Apply 1 application topically daily as needed (athlete's foot).     levothyroxine 88 MCG tablet  Commonly known as:  SYNTHROID, LEVOTHROID  Take 88 mcg by mouth daily.     pantoprazole 40 MG tablet  Commonly known as:  PROTONIX     rosuvastatin 5 MG tablet  Commonly known as:  CRESTOR  Take 2.5 mg by mouth at bedtime.     tiZANidine 4 MG tablet  Commonly known as:  ZANAFLEX  Take 1 tablet (4 mg total) by mouth at bedtime.     traMADol 50 MG tablet  Commonly known as:  ULTRAM  Take 1 tablet (50 mg total) by mouth every 6 (six) hours as needed.     traZODone 50 MG tablet  Commonly known as:  DESYREL  Take 50 mg by mouth at bedtime. Taking 2 at bedtime     zolpidem 5 MG tablet  Commonly known as:  AMBIEN  Take 5 mg by mouth at bedtime as needed for sleep.         PHYSICAL EXAMINATION  Oncology Vitals 07/22/2014 05/12/2014 03/27/2014 03/27/2014 03/26/2014 03/26/2014 03/26/2014  Height - 163 cm - - - - -  Weight 76.794 kg 76.93 kg - - - - -  Weight (lbs) 169 lbs 5 oz 169 lbs 10 oz - - - - -  BMI (kg/m2) - 29.11 kg/m2 - - - - -  Temp 98.5 98.5 98.3 99.1 99 98.8 98.5  Pulse 73 71 64 72 77 69 83  Resp 16 18 18 18 20 20 20   SpO2 99 99 95 97 95 97 94  BSA (m2) - 1.86 m2 -  - - - -   BP Readings from Last 3 Encounters:  07/22/14 157/74  05/12/14 134/61  03/27/14 119/64    Physical Exam  Constitutional: She is oriented to person, place, and time and well-developed, well-nourished, and in no distress.  HENT:  Head: Normocephalic and atraumatic.  Eyes: Conjunctivae and EOM are normal. Pupils are equal, round, and reactive to light. Right eye exhibits no discharge. Left eye exhibits no discharge. No scleral icterus.  Neck: Normal range of motion.  Pulmonary/Chest: Effort normal. No  respiratory distress.  Musculoskeletal: Normal range of motion. She exhibits edema and tenderness.  On exam.-Patient with palpable mass just above her previous right breast lumpectomy site.  The site is slightly tender with palpation.  There is no erythema, surrounding edema, or red streaks.  Patient observed with full range of motion.  Also, patient has some trace edema to right upper extremity.    Neurological: She is alert and oriented to person, place, and time. Gait normal.  Skin: Skin is warm. No rash noted. No erythema. No pallor.  Psychiatric: Affect normal.  Nursing note and vitals reviewed.   LABORATORY DATA:. No visits with results within 3 Day(s) from this visit. Latest known visit with results is:  Hospital Outpatient Visit on 03/23/2014  Component Date Value Ref Range Status  . Sodium 03/23/2014 139  135 - 145 mmol/L Final  . Potassium 03/23/2014 3.8  3.5 - 5.1 mmol/L Final  . Chloride 03/23/2014 105  96 - 112 mmol/L Final  . CO2 03/23/2014 26  19 - 32 mmol/L Final  . Glucose, Bld 03/23/2014 98  70 - 99 mg/dL Final  . BUN 03/23/2014 13  6 - 23 mg/dL Final  . Creatinine, Ser 03/23/2014 1.00  0.50 - 1.10 mg/dL Final  . Calcium 03/23/2014 9.2  8.4 - 10.5 mg/dL Final  . GFR calc non Af Amer 03/23/2014 58* >90 mL/min Final  . GFR calc Af Amer 03/23/2014 67* >90 mL/min Final   Comment: (NOTE) The eGFR has been calculated using the CKD EPI equation. This  calculation has not been validated in all clinical situations. eGFR's persistently <90 mL/min signify possible Chronic Kidney Disease.   . Anion gap 03/23/2014 8  5 - 15 Final  . WBC 03/23/2014 5.7  4.0 - 10.5 K/uL Final  . RBC 03/23/2014 4.29  3.87 - 5.11 MIL/uL Final  . Hemoglobin 03/23/2014 11.8* 12.0 - 15.0 g/dL Final  . HCT 03/23/2014 37.0  36.0 - 46.0 % Final  . MCV 03/23/2014 86.2  78.0 - 100.0 fL Final  . MCH 03/23/2014 27.5  26.0 - 34.0 pg Final  . MCHC 03/23/2014 31.9  30.0 - 36.0 g/dL Final  . RDW 03/23/2014 13.6  11.5 - 15.5 % Final  . Platelets 03/23/2014 270  150 - 400 K/uL Final  . Neutrophils Relative % 03/23/2014 62  43 - 77 % Final  . Neutro Abs 03/23/2014 3.5  1.7 - 7.7 K/uL Final  . Lymphocytes Relative 03/23/2014 30  12 - 46 % Final  . Lymphs Abs 03/23/2014 1.7  0.7 - 4.0 K/uL Final  . Monocytes Relative 03/23/2014 5  3 - 12 % Final  . Monocytes Absolute 03/23/2014 0.3  0.1 - 1.0 K/uL Final  . Eosinophils Relative 03/23/2014 3  0 - 5 % Final  . Eosinophils Absolute 03/23/2014 0.2  0.0 - 0.7 K/uL Final  . Basophils Relative 03/23/2014 0  0 - 1 % Final  . Basophils Absolute 03/23/2014 0.0  0.0 - 0.1 K/uL Final  . Prothrombin Time 03/23/2014 13.0  11.6 - 15.2 seconds Final  . INR 03/23/2014 0.97  0.00 - 1.49 Final  . MRSA, PCR 03/23/2014 NEGATIVE  NEGATIVE Final  . Staphylococcus aureus 03/23/2014 NEGATIVE  NEGATIVE Final   Comment:        The Xpert SA Assay (FDA approved for NASAL specimens in patients over 40 years of age), is one component of a comprehensive surveillance program.  Test performance has been validated by [facility] for patients greater than  or equal to 41 year old. It is not intended to diagnose infection nor to guide or monitor treatment.      RADIOGRAPHIC STUDIES: No results found.  ASSESSMENT/PLAN:    Breast cancer of upper-outer quadrant of right female breast Patient is status post right breast lumpectomy in March 2015.  She  completed radiation therapy to the right breast area in May 2015.  She underwent her most recent mammogram on 03/17/2014; which revealed no acute findings.  Patient is complaining of recently discovered mass and tenderness to right breast lumpectomy surgical site.  She admits to some heavy lifting within the past several weeks; but otherwise denies any injury or trauma.  Patient states that she follows up with her general surgeon just 2 weeks ago; and was advised that this area of concern was most likely just scar tissue.  On exam.-Patient with palpable mass just above her previous right breast lumpectomy site.  The site is slightly tender with palpation.  There is no erythema, surrounding edema, or red streaks.  Patient observed with full range of motion.  Patient requested and was given a prescription for tramadol to try for her discomfort.  She states that she has been taking ibuprofen 800 mg with only minimal effectiveness.  Of note-patient states that she develops a rash with Percocet; but states that she has taken Ultram in the past with no ill effects.  We will order a diagnostic mammogram of the right breast for further evaluation of the site.  Patient is scheduled to return for a routine follow-up visit with Dr. Lindi Adie on 11/24/2014.  Lymphedema Patient is noted to have some very mild/trace left edema to her right upper extremity.  There is no erythema, warmth, or red streaks to the arm.  We'll order a lymphedema clinic referral for further evaluation and management.  Patient stated understanding of all instructions; and was in agreement with this plan of care. The patient knows to call the clinic with any problems, questions or concerns.   Review/collaboration with Dr. Lindi Adie regarding all aspects of patient's visit today.   Total time spent with patient was 25 minutes;  with greater than 75 percent of that time spent in face to face counseling regarding patient's symptoms,  and  coordination of care and follow up.  Disclaimer: This note was dictated with voice recognition software. Similar sounding words can inadvertently be transcribed and may not be corrected upon review.   Drue Second, NP 07/22/2014

## 2014-07-22 NOTE — Assessment & Plan Note (Signed)
Patient is noted to have some very mild/trace left edema to her right upper extremity.  There is no erythema, warmth, or red streaks to the arm.  We'll order a lymphedema clinic referral for further evaluation and management.

## 2014-07-22 NOTE — Assessment & Plan Note (Signed)
Patient is status post right breast lumpectomy in March 2015.  She completed radiation therapy to the right breast area in May 2015.  She underwent her most recent mammogram on 03/17/2014; which revealed no acute findings.  Patient is complaining of recently discovered mass and tenderness to right breast lumpectomy surgical site.  She admits to some heavy lifting within the past several weeks; but otherwise denies any injury or trauma.  Patient states that she follows up with her general surgeon just 2 weeks ago; and was advised that this area of concern was most likely just scar tissue.  On exam.-Patient with palpable mass just above her previous right breast lumpectomy site.  The site is slightly tender with palpation.  There is no erythema, surrounding edema, or red streaks.  Patient observed with full range of motion.  Patient requested and was given a prescription for tramadol to try for her discomfort.  She states that she has been taking ibuprofen 800 mg with only minimal effectiveness.  Of note-patient states that she develops a rash with Percocet; but states that she has taken Ultram in the past with no ill effects.  We will order a diagnostic mammogram of the right breast for further evaluation of the site.  Patient is scheduled to return for a routine follow-up visit with Dr. Lindi Adie on 11/24/2014.

## 2014-07-27 ENCOUNTER — Ambulatory Visit
Admission: RE | Admit: 2014-07-27 | Discharge: 2014-07-27 | Disposition: A | Discharge status: Home / Self Care | Payer: Medicare PPO | Source: Ambulatory Visit | Attending: Nurse Practitioner | Admitting: Nurse Practitioner

## 2014-07-27 ENCOUNTER — Encounter: Payer: Self-pay | Admitting: Physical Therapy

## 2014-07-27 ENCOUNTER — Ambulatory Visit: Payer: Medicare PPO | Attending: Hematology and Oncology | Admitting: Physical Therapy

## 2014-07-27 ENCOUNTER — Ambulatory Visit: Payer: Medicare PPO | Admitting: Physical Therapy

## 2014-07-27 DIAGNOSIS — C50411 Malignant neoplasm of upper-outer quadrant of right female breast: Secondary | ICD-10-CM

## 2014-07-27 DIAGNOSIS — Z9189 Other specified personal risk factors, not elsewhere classified: Secondary | ICD-10-CM | POA: Diagnosis not present

## 2014-07-27 DIAGNOSIS — Z853 Personal history of malignant neoplasm of breast: Secondary | ICD-10-CM | POA: Diagnosis not present

## 2014-07-27 DIAGNOSIS — M25611 Stiffness of right shoulder, not elsewhere classified: Secondary | ICD-10-CM | POA: Diagnosis not present

## 2014-07-27 DIAGNOSIS — R921 Mammographic calcification found on diagnostic imaging of breast: Secondary | ICD-10-CM | POA: Diagnosis not present

## 2014-07-27 NOTE — Therapy (Addendum)
[place], [place], [address] Phone: [phone]   Fax:  [phone]  Physical Therapy Evaluation  Patient Details  Name: [name] MRN: [id number] Date of Birth: 06-21-1950 Referring Provider:  Nicholas Lose, MD  Encounter Date: 07/27/2014      PT End of Session - 07/27/14 1522    Visit Number 1   Number of Visits 2   Date for PT Re-Evaluation 08/31/14   PT Start Time 1440   PT Stop Time 1520   PT Time Calculation (min) 40 min   Activity Tolerance Patient tolerated treatment well   Behavior During Therapy [facility] for tasks assessed/performed      Past Medical History  Diagnosis Date  . Hypercholesteremia     takes Crestor daily  . Vertigo     doesn't take any meds  . Neck pain 05/13/2012    HNP  . Hypertension     takes HCTZ daily  . Hypothyroid     takes Synthroid daily  . Insomnia     takes Ambien nightly as needed  . Headache(784.0)     occasionally  . Neuropathy   . Arthritis   . Constipation     doesn't take any medss  . History of colon polyps   . Diverticulosis   . History of bladder infections   . History of blood transfusion     no abnormal reaction noted  . Hx of radiation therapy 05/13/13- 06/16/13    right breast 5000 cGy in 25 sessions  . Breast cancer 02/19/13  . Chronic back pain     reason unknown    Past Surgical History  Procedure Laterality Date  . Thyroid surgery    . Cholecystectomy    . Esophagogastroduodenoscopy  09/17/2008    RMR:Two mid esophageal diverticula/Small benign cystic mucosal lesions distal esophagus of doubtful  clinical significance, stable for least 5 years/ Small hiatal hernia, otherwise normal stomach D1, D2  . Colonoscopy  09/17/2008    KNL:ZJQB tortuous, but otherwise normal-appearing colon/. Scattered diverticula  . Esophagogastroduodenoscopy    02/10/2003    HAL:PFXTKW esophageal 3 cystic lesions without luminal compromise or  evidence/Nonerosive antral gastritis/The esophagus was dilated by passing 56 [place] Maloney dilator.Marland Kitchen Epic notes states +H.pylori gastritis. treatment completed per epic notes.   . Eus  Aug 2011    Dr. Newman Pies: EGD with multiple lower esophageal submucosal nodules, stomach and duodenum normal, EUS with esophageal duplication cysts  . Esophagogastroduodenoscopy (egd) with esophageal dilation N/A 02/03/2013    IOX:BDZHGD esophageal duplication cyst. Small esophageal diverticulum. Otherwise;  EGD normal - Widely patent tubular esophagus before and after dilation.  Status post passage of  a  Maloney dilator.  . Abdominal hysterectomy      partial  . Breast lumpectomy with needle localization and axillary sentinel lymph node bx Right 04/03/2013    Procedure: BREAST LUMPECTOMY WITH NEEDLE LOCALIZATION AND AXILLARY SENTINEL LYMPH NODE BX;  Surgeon: Merrie Roof, MD;  Location: [facility];  Service: General;  Laterality: Right;  . Anterior cervical decomp/discectomy fusion N/A 03/26/2014    Procedure: ANTERIOR CERVICAL DECOMPRESSION/DISCECTOMY FUSION 1 LEVEL;  Surgeon: Eustace Moore, MD;  Location: [facility];  Service: Neurosurgery;  Laterality: N/A;  ANTERIOR CERVICAL DECOMPRESSION/DISCECTOMY FUSION 1 LEVEL CERVICAL 5-6    There were no vitals filed for this visit.  Visit Diagnosis:  Shoulder stiffness, right - Plan: PT plan of care cert/re-cert  At risk for lymphedema - Plan:  PT plan of care cert/re-cert      Subjective Assessment - 07/27/14 1447    Subjective Patient reports she saw the nurse practitioner a few weeks ago and she noticed right arm swelling.  She feels her right arm swelling began 2 months.   Pertinent History Right lumpectomy with sentinel node biopsy 3/15 followed by radiation for DCIS breast cancer.   Patient Stated Goals Reduce arm swelling   Currently in Pain? Yes   Pain Score 4    Pain Location Arm   Pain Orientation Right   Pain Descriptors / Indicators Burning   Pain Type  Neuropathic pain   Pain Onset More than a month ago   Pain Frequency Intermittent   Aggravating Factors  Working alot   Pain Relieving Factors Resting / warm wash cloth   Multiple Pain Sites No            [facility] PT Assessment - 07/27/14 0001    Assessment   Medical Diagnosis Right arm lymphedema   Onset Date/Surgical Date 05/27/14   Hand Dominance Right   Next MD Visit 10/15   Prior Therapy no   Precautions   Precautions Other (comment)  Hx right breast cancer DCIS   Restrictions   Weight Bearing Restrictions No   Balance Screen   Has the patient fallen in the past 6 months No   Has the patient had a decrease in activity level because of a fear of falling?  No   Is the patient reluctant to leave their home because of a fear of falling?  No   Home Environment   Living Environment Private residence   Living Arrangements Spouse/significant other   Available Help at Discharge Family   Prior Function   Level of [place] Retired   Leisure Psychologist, counselling daily 30 minutes   Cognition   Overall Cognitive Status Within Functional Limits for tasks assessed   Posture/Postural Control   Posture/Postural Control Postural limitations   Postural Limitations Rounded Shoulders;Forward head   ROM / Strength   AROM / PROM / Strength AROM   AROM   AROM Assessment Site Shoulder   Right/Left Shoulder Right;Left   Right Shoulder Extension 41 Degrees   Right Shoulder Flexion 126 Degrees   Right Shoulder ABduction 114 Degrees   Right Shoulder Internal Rotation 71 Degrees   Right Shoulder External Rotation 76 Degrees   Left Shoulder Extension 53 Degrees   Left Shoulder Flexion 144 Degrees   Left Shoulder ABduction 136 Degrees   Left Shoulder Internal Rotation 61 Degrees   Left Shoulder External Rotation 73 Degrees           LYMPHEDEMA/ONCOLOGY QUESTIONNAIRE - 07/27/14 1456    Type   Cancer Type Right breast cancer   Surgeries   Lumpectomy Date 04/04/14    Sentinel Lymph Node Biopsy Date 04/04/14   Number Lymph Nodes Removed 1   Date Lymphedema/Swelling Started   Date 05/27/14   Treatment   Active Chemotherapy Treatment No   Past Chemotherapy Treatment No   Active Radiation Treatment No   Past Radiation Treatment Yes   Date 06/28/13   Body Site right breast   What other symptoms do you have   Are you Having Heaviness or Tightness Yes   Are you having Pain Yes   Are you having pitting edema No   Is it Hard or Difficult finding clothes that fit No   Do you have infections No   Is there Decreased scar  mobility Yes  at incision site on right breast   [place] Sign No   Lymphedema Stage   Stage STAGE 1 SPONTANEOUSLY REVERSIBLE   Lymphedema Assessments   Lymphedema Assessments Upper extremities   Right Upper Extremity Lymphedema   10 cm Proximal to Olecranon Process 30.9 cm   Olecranon Process 27 cm   10 cm Proximal to Ulnar Styloid Process 21.4 cm   Just Proximal to Ulnar Styloid Process 16.5 cm   Across Hand at [employer] 20.5 cm   At [place] of 2nd Digit 6.4 cm   Left Upper Extremity Lymphedema   10 cm Proximal to Olecranon Process 31.8 cm   Olecranon Process 26.7 cm   10 cm Proximal to Ulnar Styloid Process 20.3 cm   Just Proximal to Ulnar Styloid Process 16.1 cm   Across Hand at [employer] 19.7 cm   At [place] of 2nd Digit 6.5 cm           Quick Dash - 07/27/14 0001    Open a tight or new jar Mild difficulty   Do heavy household chores (wash walls, wash floors) Moderate difficulty   Carry a shopping bag or briefcase Moderate difficulty   Wash your back Mild difficulty   Use a knife to cut food Moderate difficulty   Recreational activities in which you take some force or impact through your arm, shoulder, or hand (golf, hammering, tennis) Moderate difficulty   During the past week, to what extent has your arm, shoulder or hand problem interfered with your normal social activities with family, friends, neighbors, or  groups? Slightly   During the past week, to what extent has your arm, shoulder or hand problem limited your work or other regular daily activities Slightly   Arm, shoulder, or hand pain. Mild   Tingling (pins and needles) in your arm, shoulder, or hand None   Difficulty Sleeping No difficulty   DASH Score 29.55 %            PT Education - 07/27/14 1521    Education provided Yes   Education Details Shoulder ROM HEP   Person(s) Educated Patient   Methods Explanation;Demonstration;Handout   Comprehension Verbalized understanding;Returned demonstration                [facility] Goals - 07/27/14 1644    CC Long Term Goal  #1   Title Patient will be able to increase right shoulder active flexion to >/= 140 degrees to reach overhead with less difficulty and to return to baseline   Baseline Currently 126 degrees flexion   Time 4   Period Weeks   Status New   CC Long Term Goal  #2   Title Patient will be able to increase right shoulder active abduction to >/= 135 degrees to reach overhead and out to the side with less difficulty and to return to baseline   Baseline Currently 114 degrees abduction   Time 4   Period Weeks   Status New            Plan - 07/27/14 1639    Clinical Impression Statement Patient is a very pleasant 64 y.o. woman s/p right lumpectomy with sentinel node biopsy and radiation.  She began having what she thought was mild arm swelling 2 months ago and is here today for an assessment.  Her measurements were taken and compared to those taken pre-operatively.  There was no measurable difference noted when compaing to those taken at  baseline.  Both arms have increased approximately 1 cm in the upper arm which is likely due to a slight weight gain.  Her shoulder ROM on the right side is however very limited compared to baseline and she reported feeling limited by that.  She had concerns about the financial impact of doing physical therapy but agreed to  trying a home exercise program for 1 month and then returning for a follow up visit.  Her HEP was issued today and she demonstrated good understanding.   Pt will benefit from skilled therapeutic intervention in order to improve on the following deficits Impaired UE functional use;Decreased range of motion   Rehab Potential Excellent   Clinical Impairments Affecting Rehab Potential Financial concerns and being willing to only return to PT 1 time   PT Frequency 1x / week   PT Duration Other (comment)  1 visit 4 weeks from now   PT Treatment/Interventions ADLs/Self Care Home Management;Patient/family education;Therapeutic exercise   PT Next Visit Plan redo DASH; Remeasure right shoulder ROM and arm circumference.  If she is happy with her progress, D/C and consider issuing additional home exercises.   Consulted and Agree with Plan of Care Patient          G-Codes - 2014-08-26 1646    Functional Assessment Tool Used Quick DASH   Functional Limitation Carrying, moving and handling objects   Carrying, Moving and Handling Objects Current Status [id number]) At least 20 percent but less than 40 percent impaired, limited or restricted   Carrying, Moving and Handling Objects Goal Status ([id number]) At least 1 percent but less than 20 percent impaired, limited or restricted       Problem List Patient Active Problem List   Diagnosis Date Noted  . Lymphedema 07/22/2014  . S/P cervical spinal fusion 03/26/2014  . Sprain of neck 07/10/2013  . Stiffness of joint, not elsewhere classified, other specified site 07/10/2013  . Breast cancer   . 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 Breast cancer of upper-outer quadrant of right female breast 02/24/2013  . Unspecified constipation 01/22/2013  . Dyspepsia 01/22/2013  . Vertigo 05/13/2012  . Headache(784.0) 05/13/2012  . Neck pain 05/13/2012  . Menopausal symptoms   . Abscess 06/14/2011  . DYSPHAGIA UNSPECIFIED 09/14/2008  .  MENOPAUSE-RELATED VASOMOTOR SYMPTOMS, HOT FLASHES 03/25/2007  . HYPERTHYROIDISM 10/22/2006  . HYPERLIPIDEMIA 10/22/2006  . HYPERTENSION 10/22/2006  . OVERACTIVE BLADDER 10/22/2006  . FIBROCYSTIC BREAST DISEASE 10/22/2006  . SWEATING 10/22/2006  . INSOMNIA, HX OF 10/22/2006    Annia Friendly, PT 08-26-14, 4:48 PM  [place], [place], [address] Phone: [phone]   Fax:  [phone]    PHYSICAL THERAPY DISCHARGE SUMMARY  Visits from Start of Care: 1  Current functional level related to goals / functional outcomes: Unknown as pt did not return.  She was not found to have any swelling present at her evaluation and was relieved by that.  She had financial concerns about attending therapy but had agreed to come to 1 more visit after trying a home exercise program for 1 month to improve shoulder ROM.  She did not return to that visit but I'd be happy to see her again if needed.   Remaining deficits: Unknown   Education / Equipment: Shoulder ROM exercises Plan: Patient agrees to discharge.  Patient goals were not met. Patient is being discharged due to not returning since the last visit.  ?????  Annia Friendly, [place] 02/10/2015 4:53 PM

## 2014-07-27 NOTE — Patient Instructions (Addendum)
Flexion (Eccentric) - Active (Cane)   Lift cane with both hands. Avoid hiking shoulders. Lower cane slowly for 3-5 seconds. _10__ reps per set, _2__ sets per day.  Copyright  VHI. All rights reserved.  Cane Exercise: Abduction   Hold cane with right hand over end, palm-up, with other hand palm-down. Move arm out from side and up by pushing with other arm. Hold __3__ seconds. Repeat __10__ times. Do __2__ sessions per day.  http://gt2.exer.us/82   Copyright  VHI. All rights reserved.   Patient was instructed today in a home exercise program today for shoulder range of motion. These included active assist shoulder flexion in sitting, scapular retraction, wall walking with shoulder abduction, and hands behind head external rotation.  She was encouraged to do these twice a day, holding 3 seconds and repeating 5 times.

## 2014-08-14 ENCOUNTER — Other Ambulatory Visit: Payer: Self-pay | Admitting: Nurse Practitioner

## 2014-08-14 ENCOUNTER — Telehealth: Payer: Self-pay | Admitting: *Deleted

## 2014-08-14 NOTE — Telephone Encounter (Signed)
This RN spoke with patient regarding right sided breast pain. Patient stated,"my primary doctor took some blackheads out, on the same side that the breast hurts, and the pain is coming from that. He dug the blackheads out and I wonder if they are infected. He didn't give me an antibiotic" Informed patient that Dr. Lindi Adie has left for the day and he will get the message on Monday. Patient verbalized understanding.

## 2014-08-14 NOTE — Telephone Encounter (Signed)
VM message received from patient at 10:37 am requesting an [place] of her right breast as she is still hurting and has burning sensation in that breast. She had diagnostic mammogram on 07/27/14

## 2014-08-17 ENCOUNTER — Telehealth: Payer: Self-pay

## 2014-08-17 DIAGNOSIS — C50411 Malignant neoplasm of upper-outer quadrant of right female breast: Secondary | ICD-10-CM

## 2014-08-17 NOTE — Telephone Encounter (Signed)
Returned pt call.  Pain and burning at incision site and right axilla.  Pt saw Lynnda Shields and had a mammogram done.  Per pt, if pain did not resolve was to have an ultrasound.  Per Dr. Lindi Adie, order [place] and make follow up appt.  Order entered.  POF entered.  Let pt know scheduling would contact her.  Pt voiced understanding.

## 2014-08-18 ENCOUNTER — Telehealth: Payer: Self-pay | Admitting: Hematology and Oncology

## 2014-08-18 DIAGNOSIS — M5412 Radiculopathy, cervical region: Secondary | ICD-10-CM | POA: Diagnosis not present

## 2014-08-18 NOTE — Telephone Encounter (Signed)
Left message to confirm appointment for [place] and appointment with Dr.Gudena

## 2014-08-21 ENCOUNTER — Other Ambulatory Visit: Payer: Medicare PPO

## 2014-08-24 ENCOUNTER — Telehealth: Payer: Self-pay | Admitting: Hematology and Oncology

## 2014-08-24 ENCOUNTER — Ambulatory Visit
Admission: RE | Admit: 2014-08-24 | Discharge: 2014-08-24 | Disposition: A | Discharge status: Home / Self Care | Payer: Medicare PPO | Source: Ambulatory Visit | Attending: Hematology and Oncology | Admitting: Hematology and Oncology

## 2014-08-24 ENCOUNTER — Encounter: Payer: Self-pay | Admitting: Hematology and Oncology

## 2014-08-24 ENCOUNTER — Ambulatory Visit: Payer: Medicare PPO | Admitting: Hematology and Oncology

## 2014-08-24 ENCOUNTER — Ambulatory Visit (HOSPITAL_BASED_OUTPATIENT_CLINIC_OR_DEPARTMENT_OTHER): Payer: Medicare PPO | Admitting: Hematology and Oncology

## 2014-08-24 VITALS — BP 135/59 | HR 70 | Temp 98.9°F | Resp 18 | Ht 64.0 in | Wt 168.0 lb

## 2014-08-24 DIAGNOSIS — N641 Fat necrosis of breast: Secondary | ICD-10-CM | POA: Diagnosis not present

## 2014-08-24 DIAGNOSIS — Z86000 Personal history of in-situ neoplasm of breast: Secondary | ICD-10-CM

## 2014-08-24 DIAGNOSIS — C50411 Malignant neoplasm of upper-outer quadrant of right female breast: Secondary | ICD-10-CM

## 2014-08-24 NOTE — Progress Notes (Signed)
Patient Care Team: Asencion Noble, MD as PCP - General (Internal Medicine)  DIAGNOSIS: Breast cancer of upper-outer quadrant of right female breast   Staging form: Breast, AJCC 7th Edition     Clinical: Stage 0 (Tis (DCIS), N0, cM0) - Unsigned       Staging comments: Staged at breast conference 03/05/13      Pathologic: No stage assigned - Unsigned   SUMMARY OF ONCOLOGIC HISTORY:   Breast cancer of upper-outer quadrant of right female breast   04/03/2013 Surgery Right breast lumpectomy: High-grade DCIS with comedonecrosis, 2 SLN negative; reresection of margins benign: ER negative PR negative   05/19/2013 - 07/07/2013 Radiation Therapy Adjuvant radiation    CHIEF COMPLIANT: Increased sensitivity in the right breast  INTERVAL HISTORY: [name] is a 64 year old above-mentioned history of right breast DCIS treated with lumpectomy and adjuvant radiation. Since she is ER/PR negative she did not need antiestrogen therapy. She is here for routine follow-up. She complains of increased sensitivity of the right breast. She is also somewhat sore. She had a recent mammogram and ultrasound of the right breast which showed evidence of fat necrosis.   REVIEW OF SYSTEMS:   Constitutional: Denies fevers, chills or abnormal weight loss Eyes: Denies blurriness of vision Ears, nose, mouth, throat, and face: Denies mucositis or sore throat Respiratory: Denies cough, dyspnea or wheezes Cardiovascular: Denies palpitation, chest discomfort or lower extremity swelling Gastrointestinal:  Denies nausea, heartburn or change in bowel habits Skin: Denies abnormal skin rashes Lymphatics: Denies new lymphadenopathy or easy bruising Neurological:Denies numbness, tingling or new weaknesses Behavioral/Psych: Mood is stable, no new changes  Breast: Right breast tenderness All other systems were reviewed with the patient and are negative.  I have reviewed the past medical history, past surgical history, social history and  family history with the patient and they are unchanged from previous note.  ALLERGIES:  is allergic to codeine and percocet.  MEDICATIONS:  Current Outpatient Prescriptions  Medication Sig Dispense Refill  . aspirin EC 81 MG tablet Take 81 mg by mouth daily.    . benzoyl peroxide-erythromycin (BENZAMYCIN) gel Apply 1 application topically 2 (two) times daily.     . fluticasone (FLONASE) 50 MCG/ACT nasal spray Place 50 sprays into the nose daily as needed for allergies.     . hydrochlorothiazide (HYDRODIURIL) 25 MG tablet Take 25 mg by mouth daily.      Marland Kitchen ketoconazole (NIZORAL) 2 % cream Apply 1 application topically daily as needed (athlete's foot).     Marland Kitchen levothyroxine (SYNTHROID, LEVOTHROID) 88 MCG tablet Take 88 mcg by mouth daily.      . pantoprazole (PROTONIX) 40 MG tablet     . rosuvastatin (CRESTOR) 5 MG tablet Take 2.5 mg by mouth at bedtime.     . traMADol (ULTRAM) 50 MG tablet Take 1 tablet (50 mg total) by mouth every 6 (six) hours as needed. 30 tablet 0  . traZODone (DESYREL) 50 MG tablet Take 50 mg by mouth at bedtime. Taking 2 at bedtime    . zolpidem (AMBIEN) 5 MG tablet Take 5 mg by mouth at bedtime as needed for sleep.   3   No current facility-administered medications for this visit.    PHYSICAL EXAMINATION: ECOG PERFORMANCE STATUS: 1 - Symptomatic but completely ambulatory  Filed Vitals:   08/24/14 1344  BP: 135/59  Pulse: 70  Temp: 98.9 F (37.2 C)  Resp: 18   Filed Weights   08/24/14 1344  Weight: 168 lb (76.204 kg)  GENERAL:alert, no distress and comfortable SKIN: skin color, texture, turgor are normal, no rashes or significant lesions EYES: normal, Conjunctiva are pink and non-injected, sclera clear OROPHARYNX:no exudate, no erythema and lips, buccal mucosa, and tongue normal  NECK: supple, thyroid normal size, non-tender, without nodularity LYMPH:  no palpable lymphadenopathy in the cervical, axillary or inguinal LUNGS: clear to auscultation and  percussion with normal breathing effort HEART: regular rate & rhythm and no murmurs and no lower extremity edema ABDOMEN:abdomen soft, non-tender and normal bowel sounds Musculoskeletal:no cyanosis of digits and no clubbing  NEURO: alert & oriented x 3 with fluent speech, no focal motor/sensory deficits BREAST: No palpable masses or nodules in either right or left breasts. There is skin discoloration the right breast from prior radiation. No palpable axillary supraclavicular or infraclavicular adenopathy no breast tenderness or nipple discharge. (exam performed in the presence of a chaperone)  LABORATORY DATA:  I have reviewed the data as listed   Chemistry      Component Value Date/Time   NA 139 03/23/2014 1301   NA 141 03/05/2013 0818   K 3.8 03/23/2014 1301   K 3.1* 03/05/2013 0818   CL 105 03/23/2014 1301   CO2 26 03/23/2014 1301   CO2 28 03/05/2013 0818   BUN 13 03/23/2014 1301   BUN 8.6 03/05/2013 0818   CREATININE 1.00 03/23/2014 1301   CREATININE 0.8 03/05/2013 0818      Component Value Date/Time   CALCIUM 9.2 03/23/2014 1301   CALCIUM 9.3 03/05/2013 0818   ALKPHOS 65 03/05/2013 0818   ALKPHOS 65 11/20/2012 1609   AST 18 03/05/2013 0818   AST 21 11/20/2012 1609   ALT 17 03/05/2013 0818   ALT 15 11/20/2012 1609   BILITOT 0.51 03/05/2013 0818   BILITOT 0.4 11/20/2012 1609       Lab Results  Component Value Date   WBC 5.7 03/23/2014   HGB 11.8* 03/23/2014   HCT 37.0 03/23/2014   MCV 86.2 03/23/2014   PLT 270 03/23/2014   NEUTROABS 3.5 03/23/2014     RADIOGRAPHIC STUDIES: I have personally reviewed the radiology reports and agreed with their findings. US Breast Ltd Uni Right Inc Axilla  08/24/2014   CLINICAL DATA:  History of treated right breast cancer, status post lumpectomy and radiation therapy in 2015. Patient complains of burning pain within the right breast lumpectomy site and right axilla.  EXAM: ULTRASOUND OF THE RIGHT BREAST  COMPARISON:  Previous  exam(s).  FINDINGS: On physical exam, there is a firm circumscribed superficial approximately 3 cm palpable mass at the lumpectomy site.  Targeted ultrasound is performed, showing a right breast 10 o'clock 9 cm from the nipple, at the lumpectomy site, benign-appearing circumscribed hypoechoic horizontally oriented rim calcified mass within surgical scarring, which is consistent with calcifying seroma/formation of fat necrosis.  Ultrasound examination of the right axilla demonstrates no evidence of lymphadenopathy.  IMPRESSION: Fat necrosis formation within the right breast lumpectomy site. No evidence of focal recurrence.  RECOMMENDATION: Further management of patient's symptoms should be based on clinical grounds.  Patient is due for a bilateral mammogram in February 2017.  Diagnostic mammogram is suggested in February 2017.  I have discussed the findings and recommendations with the patient. Results were also provided in writing at the conclusion of the visit. If applicable, a reminder letter will be sent to the patient regarding the next appointment.  BI-RADS CATEGORY  2: Benign Finding(s)   Electronically Signed   By: Linwood Dibbles.D.  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 Breast exam  08/24/2014:  Area of surgery it appears to be slightly nodular 2. Mammogram  07/27/2014 unilateral right side evaluate palpable abnormality :  Appears to be fat necrosis  Annual mammograms in both breasts be obtained again in February 2016 , breast density category C   return to clinic once a year for follow-up   No orders of the defined types were placed in this encounter.   The patient has a good understanding of the overall plan. she agrees with it. she will call with any problems that may develop  before the next visit here.   Rulon Eisenmenger, MD

## 2014-08-24 NOTE — Assessment & Plan Note (Signed)
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 Breast exam  08/24/2014:  Area of surgery it appears to be slightly nodular 2. Mammogram  07/27/2014 unilateral right side evaluate palpable abnormality :  Appears to be fat necrosis  Annual mammograms in both breasts be obtained again in February 2016 , breast density category C   return to clinic once a year for follow-up

## 2014-08-24 NOTE — Telephone Encounter (Signed)
Appointments made and avs printed for patient °

## 2014-08-27 ENCOUNTER — Ambulatory Visit: Payer: Medicare PPO | Admitting: Physical Therapy

## 2014-09-07 IMAGING — US US BREAST LTD UNI RIGHT INC AXILLA
1 series · 8 of 8 positions shown · non-contrast
Comparison: 02/12/2012, 01/26/2011, 01/20/2010, 01/18/2009

CLINICAL DATA: Patient feels a mass in the right upper outer
quadrant.

EXAM:
DIGITAL DIAGNOSTIC  BILATERAL MAMMOGRAM WITH CAD
ULTRASOUND RIGHT BREAST

[Series 1: us breast ltd uni right inc axilla · 0.06mm/px · 8 of 8 slices shown]
[im 1/8]
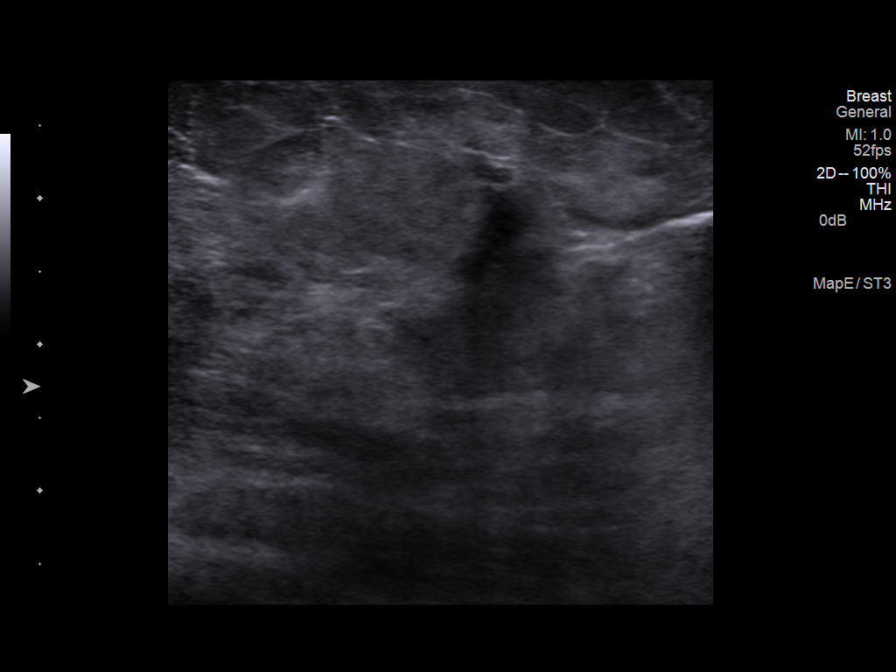
[im 2/8]
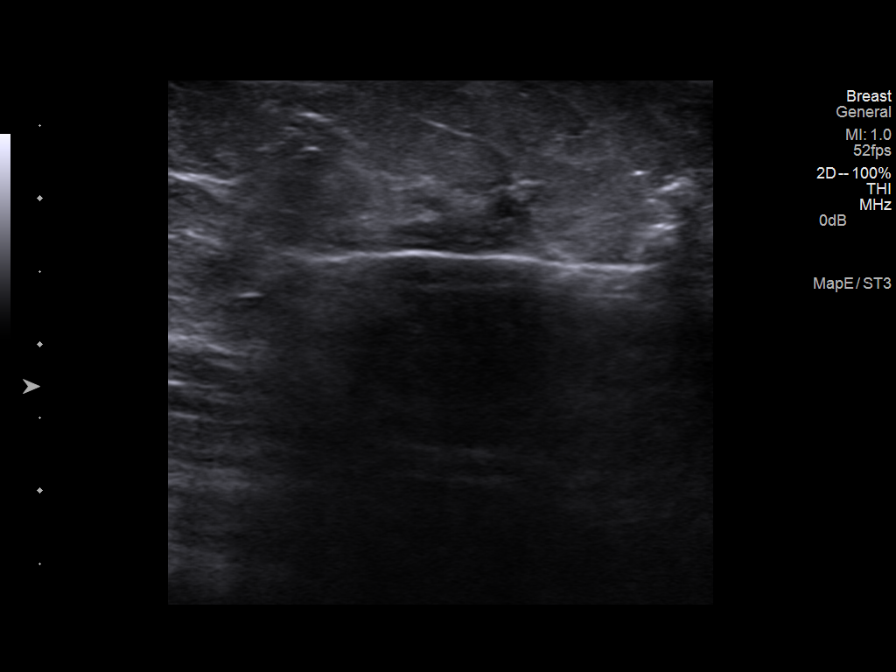
[im 3/8]
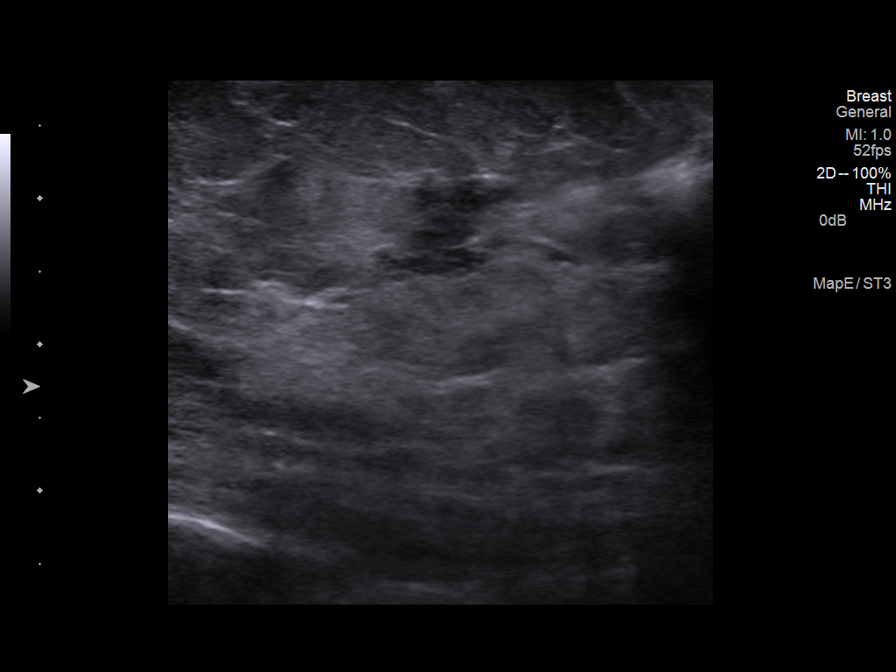
[im 4/8]
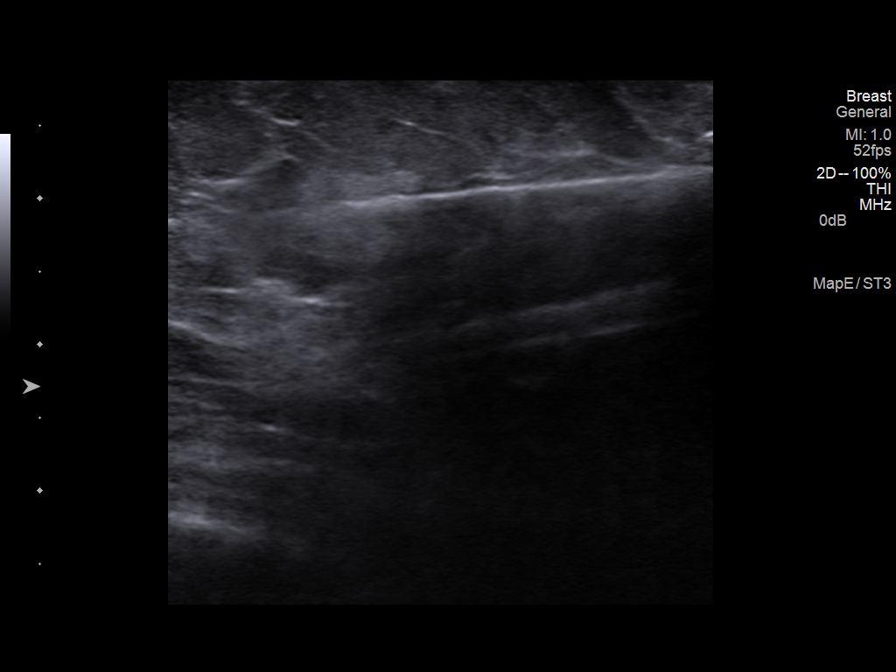
[im 5/8]
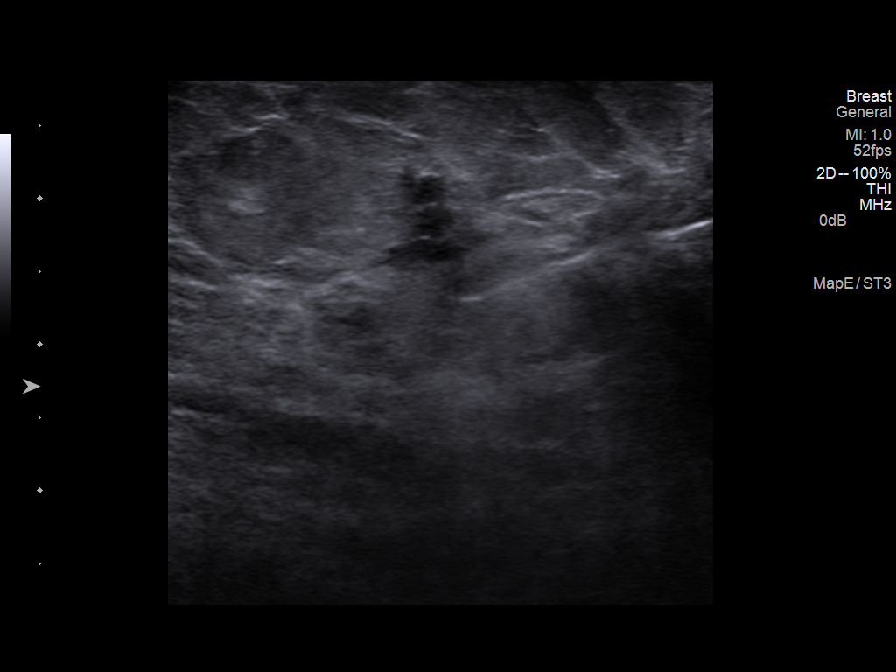
[im 6/8]
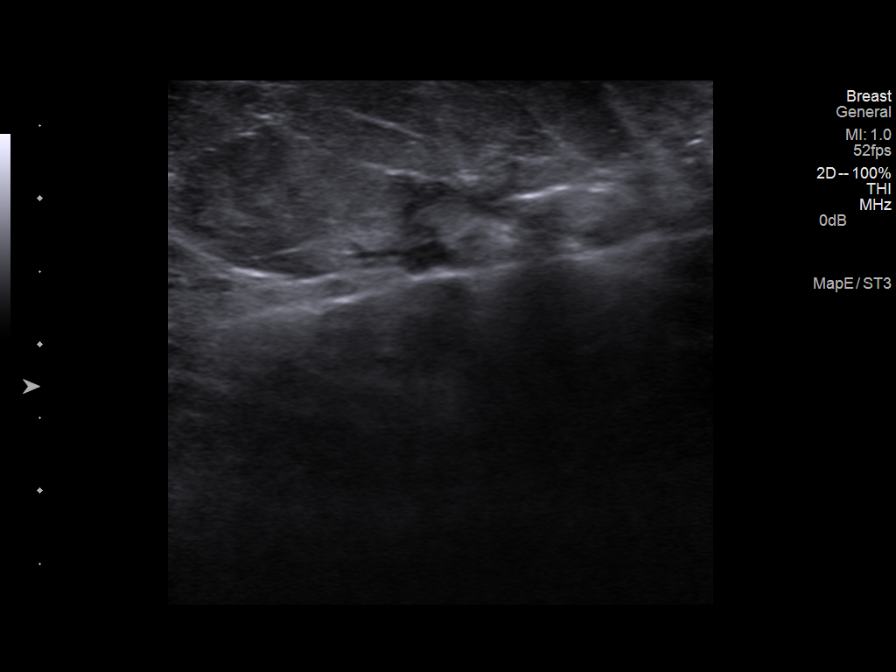
[im 7/8]
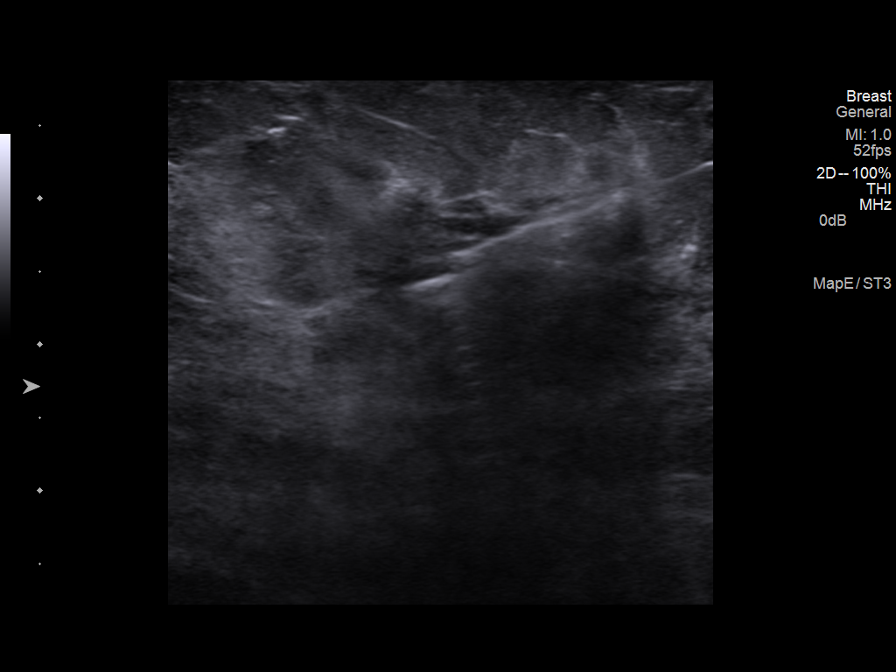
[im 8/8]
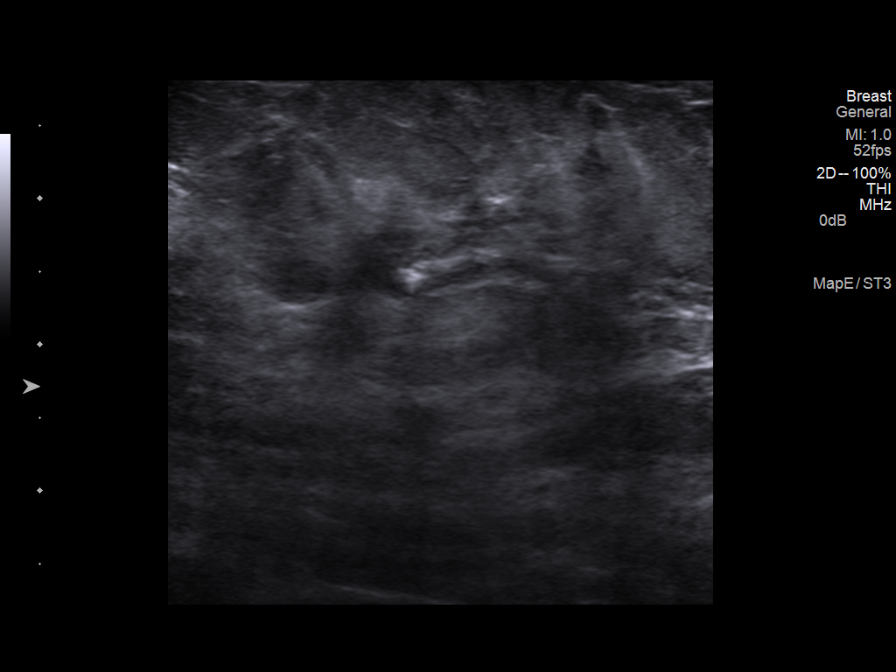

[8 of 8 positions shown; findings below may reference images not displayed]

ACR Breast Density Category b: There are scattered areas of
fibroglandular density.
FINDINGS: There is a vague irregular focal asymmetry in the right upper outer
quadrant which corresponds to the palpable abnormality. No other
abnormalities noted in either breast.

Mammographic images were processed with CAD.

On physical exam, there is a firm palpable mass in the right upper
outer quadrant at 10 o'clock 6 cm from the right nipple.

Ultrasound is performed, showing an irregular mass at 10 o'clock 6
cm from the right nipple measuring 3.1 x 2.0 x 1.2 cm. No abnormal
right axillary lymph nodes are identified.

The appearance is suspicious for invasive mammary carcinoma,
possibly lobular carcinoma. Biopsy is suggested. Ultrasound-guided
core needle biopsy was discussed with the patient and she agreed
with this plan.
IMPRESSION: Suspicious irregular mass at 10 o'clock 6 cm from the right nipple.

RECOMMENDATION:
Ultrasound-guided core needle biopsy is suggested. This will be
performed and reported separately.

I have discussed the findings and recommendations with the patient.
Results were also provided in writing at the conclusion of the
visit.

BI-RADS CATEGORY  4: Suspicious abnormality - biopsy should be
considered.

## 2014-09-14 ENCOUNTER — Ambulatory Visit: Payer: Medicare PPO | Admitting: Physical Therapy

## 2014-09-22 ENCOUNTER — Ambulatory Visit: Payer: Medicare PPO

## 2014-09-24 ENCOUNTER — Ambulatory Visit: Payer: Medicare PPO | Admitting: Physical Therapy

## 2014-09-24 DIAGNOSIS — Z6828 Body mass index (BMI) 28.0-28.9, adult: Secondary | ICD-10-CM | POA: Diagnosis not present

## 2014-09-24 DIAGNOSIS — M5416 Radiculopathy, lumbar region: Secondary | ICD-10-CM | POA: Diagnosis not present

## 2014-11-04 DIAGNOSIS — E890 Postprocedural hypothyroidism: Secondary | ICD-10-CM | POA: Diagnosis not present

## 2014-11-11 DIAGNOSIS — E890 Postprocedural hypothyroidism: Secondary | ICD-10-CM | POA: Diagnosis not present

## 2014-11-11 DIAGNOSIS — C50419 Malignant neoplasm of upper-outer quadrant of unspecified female breast: Secondary | ICD-10-CM | POA: Diagnosis not present

## 2014-11-11 DIAGNOSIS — E7800 Pure hypercholesterolemia, unspecified: Secondary | ICD-10-CM | POA: Diagnosis not present

## 2014-11-11 DIAGNOSIS — I10 Essential (primary) hypertension: Secondary | ICD-10-CM | POA: Diagnosis not present

## 2014-11-11 DIAGNOSIS — E0500 Thyrotoxicosis with diffuse goiter without thyrotoxic crisis or storm: Secondary | ICD-10-CM | POA: Diagnosis not present

## 2014-11-24 ENCOUNTER — Encounter: Payer: Self-pay | Admitting: Hematology and Oncology

## 2014-11-24 ENCOUNTER — Ambulatory Visit (HOSPITAL_BASED_OUTPATIENT_CLINIC_OR_DEPARTMENT_OTHER): Payer: Medicare PPO | Admitting: Hematology and Oncology

## 2014-11-24 VITALS — BP 138/67 | HR 72 | Temp 98.3°F | Resp 18 | Ht 64.0 in | Wt 168.0 lb

## 2014-11-24 DIAGNOSIS — C50411 Malignant neoplasm of upper-outer quadrant of right female breast: Secondary | ICD-10-CM

## 2014-11-24 DIAGNOSIS — Z86000 Personal history of in-situ neoplasm of breast: Secondary | ICD-10-CM | POA: Diagnosis not present

## 2014-11-24 NOTE — Progress Notes (Signed)
Patient Care Team: Asencion Noble, MD as PCP - General (Internal Medicine)  DIAGNOSIS: Breast cancer of upper-outer quadrant of right female breast [facility])   Staging form: Breast, AJCC 7th Edition     Clinical: Stage 0 (Tis (DCIS), N0, cM0) - Unsigned       Staging comments: Staged at breast conference 03/05/13      Pathologic: No stage assigned - Unsigned   SUMMARY OF ONCOLOGIC HISTORY:   Breast cancer of upper-outer quadrant of right female breast ([place])   04/03/2013 Surgery Right breast lumpectomy: High-grade DCIS with comedonecrosis, 2 SLN negative; reresection of margins benign: ER negative PR negative   05/19/2013 - 07/07/2013 Radiation Therapy Adjuvant radiation    CHIEF COMPLIANT: Right chest discomfort  INTERVAL HISTORY: [name] is a 64 year old with above-mentioned history of right breast DCIS and underwent lumpectomy and radiation and is currently on observation. She continues to complain of right breast discomfort she rates the pain as 4 out of 10. Activity increases the pain. She is very frustrated with the amount of pain that she is going through.  REVIEW OF SYSTEMS:   Constitutional: Denies fevers, chills or abnormal weight loss Eyes: Denies blurriness of vision Ears, nose, mouth, throat, and face: Denies mucositis or sore throat Respiratory: Denies cough, dyspnea or wheezes Cardiovascular: Denies palpitation, chest discomfort or lower extremity swelling Gastrointestinal:  Denies nausea, heartburn or change in bowel habits Skin: Denies abnormal skin rashes Lymphatics: Denies new lymphadenopathy or easy bruising Neurological:Denies numbness, tingling or new weaknesses Behavioral/Psych: Mood is stable, no new changes  Breast: Right breast discomfort, mild discomfort in the medial aspect of the left breast as well. All other systems were reviewed with the patient and are negative.  I have reviewed the past medical history, past surgical history, social history and family  history with the patient and they are unchanged from previous note.  ALLERGIES:  is allergic to codeine and percocet.  MEDICATIONS:  Current Outpatient Prescriptions  Medication Sig Dispense Refill  . aspirin EC 81 MG tablet Take 81 mg by mouth daily.    . benzoyl peroxide-erythromycin (BENZAMYCIN) gel Apply 1 application topically 2 (two) times daily.     . fluticasone (FLONASE) 50 MCG/ACT nasal spray Place 50 sprays into the nose daily as needed for allergies.     . hydrochlorothiazide (HYDRODIURIL) 25 MG tablet Take 25 mg by mouth daily.      Marland Kitchen ketoconazole (NIZORAL) 2 % cream Apply 1 application topically daily as needed (athlete's foot).     Marland Kitchen levothyroxine (SYNTHROID, LEVOTHROID) 88 MCG tablet Take 88 mcg by mouth daily.      . pantoprazole (PROTONIX) 40 MG tablet     . rosuvastatin (CRESTOR) 5 MG tablet Take 2.5 mg by mouth at bedtime.     . traMADol (ULTRAM) 50 MG tablet Take 1 tablet (50 mg total) by mouth every 6 (six) hours as needed. 30 tablet 0  . traZODone (DESYREL) 50 MG tablet Take 50 mg by mouth at bedtime. Taking 2 at bedtime    . zolpidem (AMBIEN) 5 MG tablet Take 5 mg by mouth at bedtime as needed for sleep.   3   No current facility-administered medications for this visit.    PHYSICAL EXAMINATION: ECOG PERFORMANCE STATUS: 1 - Symptomatic but completely ambulatory  Filed Vitals:   11/24/14 1052  BP: 138/67  Pulse: 72  Temp: 98.3 F (36.8 C)  Resp: 18   Filed Weights   11/24/14 1052  Weight: 168 lb (76.204  kg)    GENERAL:alert, no distress and comfortable SKIN: skin color, texture, turgor are normal, no rashes or significant lesions EYES: normal, Conjunctiva are pink and non-injected, sclera clear OROPHARYNX:no exudate, no erythema and lips, buccal mucosa, and tongue normal  NECK: supple, thyroid normal size, non-tender, without nodularity LYMPH:  no palpable lymphadenopathy in the cervical, axillary or inguinal LUNGS: clear to auscultation and  percussion with normal breathing effort HEART: regular rate & rhythm and no murmurs and no lower extremity edema ABDOMEN:abdomen soft, non-tender and normal bowel sounds Musculoskeletal:no cyanosis of digits and no clubbing  NEURO: alert & oriented x 3 with fluent speech, no focal motor/sensory deficits BREAST: Right breast discomfort. About the surgical scar in the right breast there are some palpable nodularity which were the fat necrosis. Mild discomfort in the medial aspect of the left breast as well.. (exam performed in the presence of a chaperone)  LABORATORY DATA:  I have reviewed the data as listed   Chemistry      Component Value Date/Time   NA 139 03/23/2014 1301   NA 141 03/05/2013 0818   K 3.8 03/23/2014 1301   K 3.1* 03/05/2013 0818   CL 105 03/23/2014 1301   CO2 26 03/23/2014 1301   CO2 28 03/05/2013 0818   BUN 13 03/23/2014 1301   BUN 8.6 03/05/2013 0818   CREATININE 1.00 03/23/2014 1301   CREATININE 0.8 03/05/2013 0818      Component Value Date/Time   CALCIUM 9.2 03/23/2014 1301   CALCIUM 9.3 03/05/2013 0818   ALKPHOS 65 03/05/2013 0818   ALKPHOS 65 11/20/2012 1609   AST 18 03/05/2013 0818   AST 21 11/20/2012 1609   ALT 17 03/05/2013 0818   ALT 15 11/20/2012 1609   BILITOT 0.51 03/05/2013 0818   BILITOT 0.4 11/20/2012 1609       Lab Results  Component Value Date   WBC 5.7 03/23/2014   HGB 11.8* 03/23/2014   HCT 37.0 03/23/2014   MCV 86.2 03/23/2014   PLT 270 03/23/2014   NEUTROABS 3.5 03/23/2014   ASSESSMENT & PLAN:  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 Breast exam 11/24/2014: Area of surgery it appears to be slightly nodular 2. Mammogram 07/27/2014 unilateral right side evaluate palpable abnormality : Appears to be fat necrosis  Increased pain and sensitivity in the right breast:  This is also accompanied by pain in the right arm. This could be related to prior surgery and adjuvant radiation.  Recommendation: 1. I requested the patient to make an appointment and see Dr. Marlou Starks 2. I wrote a prescription for lymphedema sleeve and Bras I encouraged her to use a heating pad, menthol and tonic water at bedtime to help with the sore muscles  Patient will undergo a mammogram in February 2017 Return to clinic as previously scheduled  No orders of the defined types were placed in this encounter.   The patient has a good understanding of the overall plan. she agrees with it. she will call with any problems that may develop before the next visit here.   Rulon Eisenmenger, MD 11/24/2014

## 2014-11-24 NOTE — Assessment & Plan Note (Signed)
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 Breast exam 11/24/2014: Area of surgery it appears to be slightly nodular 2. Mammogram 07/27/2014 unilateral right side evaluate palpable abnormality : Appears to be fat necrosis  Increased pain and sensitivity in the right breast: This is also accompanied by pain in the right arm. This could be related to prior surgery and adjuvant radiation.  Recommendation: 1. I requested the patient to make an appointment and see Dr. Marlou Starks 2. I wrote a prescription for lymphedema sleeve and Bras I encouraged her to use a heating pad, menthol and tonic water at bedtime to help with the sore muscles  Patient will undergo a mammogram in February 2017 Return to clinic as previously scheduled

## 2014-11-26 DIAGNOSIS — Z23 Encounter for immunization: Secondary | ICD-10-CM | POA: Diagnosis not present

## 2014-12-08 DIAGNOSIS — E890 Postprocedural hypothyroidism: Secondary | ICD-10-CM | POA: Diagnosis not present

## 2014-12-15 DIAGNOSIS — E890 Postprocedural hypothyroidism: Secondary | ICD-10-CM | POA: Diagnosis not present

## 2014-12-15 DIAGNOSIS — E0500 Thyrotoxicosis with diffuse goiter without thyrotoxic crisis or storm: Secondary | ICD-10-CM | POA: Diagnosis not present

## 2014-12-15 DIAGNOSIS — C50419 Malignant neoplasm of upper-outer quadrant of unspecified female breast: Secondary | ICD-10-CM | POA: Diagnosis not present

## 2014-12-15 DIAGNOSIS — E7800 Pure hypercholesterolemia, unspecified: Secondary | ICD-10-CM | POA: Diagnosis not present

## 2014-12-15 DIAGNOSIS — I10 Essential (primary) hypertension: Secondary | ICD-10-CM | POA: Diagnosis not present

## 2014-12-21 DIAGNOSIS — D0511 Intraductal carcinoma in situ of right breast: Secondary | ICD-10-CM | POA: Diagnosis not present

## 2014-12-28 DIAGNOSIS — D0511 Intraductal carcinoma in situ of right breast: Secondary | ICD-10-CM | POA: Diagnosis not present

## 2014-12-28 DIAGNOSIS — Z170 Estrogen receptor positive status [ER+]: Secondary | ICD-10-CM | POA: Diagnosis not present

## 2015-01-07 DIAGNOSIS — I10 Essential (primary) hypertension: Secondary | ICD-10-CM | POA: Diagnosis not present

## 2015-01-07 DIAGNOSIS — G4709 Other insomnia: Secondary | ICD-10-CM | POA: Diagnosis not present

## 2015-01-07 DIAGNOSIS — Z6829 Body mass index (BMI) 29.0-29.9, adult: Secondary | ICD-10-CM | POA: Diagnosis not present

## 2015-01-21 DIAGNOSIS — J069 Acute upper respiratory infection, unspecified: Secondary | ICD-10-CM | POA: Diagnosis not present

## 2015-02-24 ENCOUNTER — Other Ambulatory Visit: Payer: Self-pay | Admitting: Oncology

## 2015-02-24 ENCOUNTER — Other Ambulatory Visit: Payer: Self-pay | Admitting: General Surgery

## 2015-02-24 DIAGNOSIS — Z853 Personal history of malignant neoplasm of breast: Secondary | ICD-10-CM

## 2015-03-19 ENCOUNTER — Other Ambulatory Visit: Payer: Self-pay | Admitting: General Surgery

## 2015-03-19 ENCOUNTER — Ambulatory Visit
Admission: RE | Admit: 2015-03-19 | Discharge: 2015-03-19 | Disposition: A | Discharge status: Home / Self Care | Payer: Medicare Other | Source: Ambulatory Visit | Attending: General Surgery | Admitting: General Surgery

## 2015-03-19 DIAGNOSIS — Z853 Personal history of malignant neoplasm of breast: Secondary | ICD-10-CM

## 2015-04-07 MED ORDER — LIDOCAINE-PRILOCAINE 2.5-2.5 % EX CREA
TOPICAL_CREAM | CUTANEOUS | Status: AC
  Filled 2015-04-07: qty 5

## 2015-06-02 MED ORDER — LIDOCAINE-PRILOCAINE 2.5-2.5 % EX CREA
TOPICAL_CREAM | CUTANEOUS | Status: AC
  Filled 2015-06-02: qty 5

## 2015-08-26 ENCOUNTER — Ambulatory Visit: Payer: Medicare Other | Admitting: Hematology and Oncology

## 2015-08-26 NOTE — Assessment & Plan Note (Deleted)
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 Breast exam 08/26/2015: Area of surgery it appears to be slightly nodular 2. Mammogram 03/19/2015 ; Breast density category B, no evidence of mlignancy. Ultrasound normal. Increased pain and sensitivity in the right breast: This is also accompanied by pain in the right arm. This could be related to prior surgery and adjuvant radiation.   return to clinic in 1 year for follow-up

## 2015-10-13 ENCOUNTER — Ambulatory Visit: Payer: Medicare Other | Admitting: Urology

## 2015-12-08 ENCOUNTER — Other Ambulatory Visit (HOSPITAL_COMMUNITY): Payer: Self-pay | Admitting: Neurological Surgery

## 2015-12-08 DIAGNOSIS — M542 Cervicalgia: Secondary | ICD-10-CM

## 2015-12-15 ENCOUNTER — Ambulatory Visit (HOSPITAL_COMMUNITY)
Admission: RE | Admit: 2015-12-15 | Discharge: 2015-12-15 | Disposition: A | Discharge status: Home / Self Care | Payer: Medicare Other | Source: Ambulatory Visit | Attending: Neurological Surgery | Admitting: Neurological Surgery

## 2015-12-15 DIAGNOSIS — M542 Cervicalgia: Secondary | ICD-10-CM | POA: Diagnosis present

## 2015-12-15 DIAGNOSIS — Z981 Arthrodesis status: Secondary | ICD-10-CM | POA: Diagnosis not present

## 2016-01-05 ENCOUNTER — Ambulatory Visit (INDEPENDENT_AMBULATORY_CARE_PROVIDER_SITE_OTHER): Payer: Medicare Other | Admitting: Urology

## 2016-01-05 ENCOUNTER — Other Ambulatory Visit (HOSPITAL_COMMUNITY)
Admission: RE | Admit: 2016-01-05 | Discharge: 2016-01-05 | Disposition: A | Discharge status: Home / Self Care | Payer: Medicare Other | Source: Other Acute Inpatient Hospital | Attending: Urology | Admitting: Urology

## 2016-01-05 DIAGNOSIS — N393 Stress incontinence (female) (male): Secondary | ICD-10-CM

## 2016-01-05 DIAGNOSIS — R109 Unspecified abdominal pain: Secondary | ICD-10-CM | POA: Insufficient documentation

## 2016-01-05 LAB — URINALYSIS, ROUTINE W REFLEX MICROSCOPIC
Bilirubin Urine: NEGATIVE
GLUCOSE, UA: NEGATIVE mg/dL
Ketones, ur: NEGATIVE mg/dL
Nitrite: NEGATIVE
Protein, ur: NEGATIVE mg/dL
pH: 6.5 (ref 5.0–8.0)

## 2016-01-05 LAB — URINALYSIS, MICROSCOPIC (REFLEX)

## 2016-02-03 ENCOUNTER — Other Ambulatory Visit: Payer: Self-pay | Admitting: Oncology

## 2016-02-03 DIAGNOSIS — Z853 Personal history of malignant neoplasm of breast: Secondary | ICD-10-CM

## 2016-02-10 DIAGNOSIS — R0781 Pleurodynia: Secondary | ICD-10-CM | POA: Insufficient documentation

## 2016-03-01 ENCOUNTER — Ambulatory Visit (INDEPENDENT_AMBULATORY_CARE_PROVIDER_SITE_OTHER): Payer: Medicare Other | Admitting: Urology

## 2016-03-01 ENCOUNTER — Other Ambulatory Visit (HOSPITAL_COMMUNITY)
Admission: RE | Admit: 2016-03-01 | Discharge: 2016-03-01 | Disposition: A | Discharge status: Home / Self Care | Payer: Medicare Other | Source: Ambulatory Visit | Attending: Urology | Admitting: Urology

## 2016-03-01 DIAGNOSIS — N393 Stress incontinence (female) (male): Secondary | ICD-10-CM | POA: Diagnosis not present

## 2016-03-01 LAB — URINALYSIS, ROUTINE W REFLEX MICROSCOPIC
BILIRUBIN URINE: NEGATIVE
Bacteria, UA: NONE SEEN
GLUCOSE, UA: NEGATIVE mg/dL
KETONES UR: NEGATIVE mg/dL
Leukocytes, UA: NEGATIVE
NITRITE: NEGATIVE
PH: 7 (ref 5.0–8.0)
Protein, ur: NEGATIVE mg/dL
Specific Gravity, Urine: 1.008 (ref 1.005–1.030)

## 2016-03-20 ENCOUNTER — Ambulatory Visit
Admission: RE | Admit: 2016-03-20 | Discharge: 2016-03-20 | Disposition: A | Discharge status: Home / Self Care | Payer: Medicare Other | Source: Ambulatory Visit | Attending: Oncology | Admitting: Oncology

## 2016-03-20 DIAGNOSIS — Z853 Personal history of malignant neoplasm of breast: Secondary | ICD-10-CM

## 2016-05-31 ENCOUNTER — Ambulatory Visit (INDEPENDENT_AMBULATORY_CARE_PROVIDER_SITE_OTHER): Payer: Medicare Other | Admitting: Urology

## 2016-05-31 DIAGNOSIS — R3129 Other microscopic hematuria: Secondary | ICD-10-CM

## 2016-05-31 DIAGNOSIS — R102 Pelvic and perineal pain: Secondary | ICD-10-CM | POA: Diagnosis not present

## 2016-06-15 ENCOUNTER — Other Ambulatory Visit (HOSPITAL_COMMUNITY): Payer: Self-pay | Admitting: Internal Medicine

## 2016-06-15 ENCOUNTER — Ambulatory Visit (HOSPITAL_COMMUNITY)
Admission: RE | Admit: 2016-06-15 | Discharge: 2016-06-15 | Disposition: A | Discharge status: Home / Self Care | Payer: Medicare Other | Source: Ambulatory Visit | Attending: Internal Medicine | Admitting: Internal Medicine

## 2016-06-15 DIAGNOSIS — M79671 Pain in right foot: Secondary | ICD-10-CM

## 2016-06-15 DIAGNOSIS — M7731 Calcaneal spur, right foot: Secondary | ICD-10-CM | POA: Insufficient documentation

## 2016-07-18 ENCOUNTER — Other Ambulatory Visit (HOSPITAL_COMMUNITY): Payer: Self-pay | Admitting: Internal Medicine

## 2016-07-18 ENCOUNTER — Ambulatory Visit (HOSPITAL_COMMUNITY)
Admission: RE | Admit: 2016-07-18 | Discharge: 2016-07-18 | Disposition: A | Discharge status: Home / Self Care | Payer: Medicare Other | Source: Ambulatory Visit | Attending: Internal Medicine | Admitting: Internal Medicine

## 2016-07-18 DIAGNOSIS — R52 Pain, unspecified: Secondary | ICD-10-CM

## 2016-07-18 DIAGNOSIS — M25551 Pain in right hip: Secondary | ICD-10-CM | POA: Diagnosis not present

## 2016-07-18 DIAGNOSIS — M160 Bilateral primary osteoarthritis of hip: Secondary | ICD-10-CM | POA: Insufficient documentation

## 2016-10-31 DIAGNOSIS — M5136 Other intervertebral disc degeneration, lumbar region: Secondary | ICD-10-CM | POA: Insufficient documentation

## 2016-10-31 DIAGNOSIS — K219 Gastro-esophageal reflux disease without esophagitis: Secondary | ICD-10-CM | POA: Insufficient documentation

## 2016-11-14 ENCOUNTER — Other Ambulatory Visit: Payer: Self-pay | Admitting: Urology

## 2016-11-14 DIAGNOSIS — R3129 Other microscopic hematuria: Secondary | ICD-10-CM

## 2016-11-20 ENCOUNTER — Ambulatory Visit
Admission: RE | Admit: 2016-11-20 | Discharge: 2016-11-20 | Disposition: A | Discharge status: Home / Self Care | Payer: Medicare Other | Source: Ambulatory Visit | Attending: Urology | Admitting: Urology

## 2016-11-20 DIAGNOSIS — R3129 Other microscopic hematuria: Secondary | ICD-10-CM | POA: Diagnosis present

## 2016-12-06 ENCOUNTER — Ambulatory Visit: Payer: Medicare Other | Admitting: Urology

## 2016-12-06 DIAGNOSIS — R3129 Other microscopic hematuria: Secondary | ICD-10-CM | POA: Diagnosis not present

## 2016-12-06 DIAGNOSIS — N393 Stress incontinence (female) (male): Secondary | ICD-10-CM

## 2017-01-03 ENCOUNTER — Encounter (HOSPITAL_COMMUNITY): Payer: Self-pay | Admitting: Emergency Medicine

## 2017-01-03 ENCOUNTER — Emergency Department (HOSPITAL_COMMUNITY)
Admission: EM | Admit: 2017-01-03 | Discharge: 2017-01-03 | Disposition: A | Discharge status: Home / Self Care | Payer: Medicare Other | Attending: Emergency Medicine | Admitting: Emergency Medicine

## 2017-01-03 ENCOUNTER — Emergency Department (HOSPITAL_COMMUNITY): Payer: Medicare Other

## 2017-01-03 DIAGNOSIS — Z79899 Other long term (current) drug therapy: Secondary | ICD-10-CM | POA: Insufficient documentation

## 2017-01-03 DIAGNOSIS — X58XXXA Exposure to other specified factors, initial encounter: Secondary | ICD-10-CM | POA: Diagnosis not present

## 2017-01-03 DIAGNOSIS — S62639A Displaced fracture of distal phalanx of unspecified finger, initial encounter for closed fracture: Secondary | ICD-10-CM

## 2017-01-03 DIAGNOSIS — E039 Hypothyroidism, unspecified: Secondary | ICD-10-CM | POA: Insufficient documentation

## 2017-01-03 DIAGNOSIS — Y929 Unspecified place or not applicable: Secondary | ICD-10-CM | POA: Diagnosis not present

## 2017-01-03 DIAGNOSIS — Y999 Unspecified external cause status: Secondary | ICD-10-CM | POA: Insufficient documentation

## 2017-01-03 DIAGNOSIS — Z7982 Long term (current) use of aspirin: Secondary | ICD-10-CM | POA: Diagnosis not present

## 2017-01-03 DIAGNOSIS — I10 Essential (primary) hypertension: Secondary | ICD-10-CM | POA: Insufficient documentation

## 2017-01-03 DIAGNOSIS — Y939 Activity, unspecified: Secondary | ICD-10-CM | POA: Insufficient documentation

## 2017-01-03 DIAGNOSIS — S62632A Displaced fracture of distal phalanx of right middle finger, initial encounter for closed fracture: Secondary | ICD-10-CM | POA: Insufficient documentation

## 2017-01-03 DIAGNOSIS — M20011 Mallet finger of right finger(s): Secondary | ICD-10-CM

## 2017-01-03 MED ORDER — NAPROXEN 500 MG PO TABS: 500.0000 mg | ORAL_TABLET | Freq: Two times a day (BID) | ORAL | 0 refills | Status: DC

## 2017-01-03 NOTE — Discharge Instructions (Signed)
Keep the finger splinted.  Apply ice packs on/off to help reduce pain and swelling.  Call Dr. Ruthe Mannan office to arrange a follow-up appt.

## 2017-01-03 NOTE — ED Triage Notes (Signed)
Pt c/o pain to right ring finger.

## 2017-01-04 NOTE — ED Provider Notes (Signed)
[facility] EMERGENCY DEPARTMENT Provider Note   CSN: [id number] Arrival date & time: 01/03/17  1157     History   Chief Complaint Chief Complaint  Patient presents with  . Hand Pain    HPI [name] is a 66 y.o. female.  HPI  [name] is a 66 y.o. female who presents to the Emergency Department complaining of right ring finger pain for over one week.  States that she "jammed" her finger while putting on boots and complains of pain and difficulty extending the distal tip of the right ring finger.  Also complains of swelling and "warmth" to the distal end.  States that she cannot keep the end of her finger fully extended.  Denies numbness, wrist pain, open wound.  States she kept her finger splinted for a few days with a popsicle stick, but did not help.    Past Medical History:  Diagnosis Date  . Arthritis   . Breast cancer ([place]) 02/19/13  . Chronic back pain    reason unknown  . Constipation    doesn't take any medss  . Diverticulosis   . Headache(784.0)    occasionally  . History of bladder infections   . History of blood transfusion    no abnormal reaction noted  . History of colon polyps   . Hx of radiation therapy 05/13/13- 06/16/13   right breast 5000 cGy in 25 sessions  . Hypercholesteremia    takes Crestor daily  . Hypertension    takes HCTZ daily  . Hypothyroid    takes Synthroid daily  . Insomnia    takes Ambien nightly as needed  . Neck pain 05/13/2012   HNP  . Neuropathy   . Vertigo    doesn't take any meds    Patient Active Problem List   Diagnosis Date Noted  . Lymphedema 07/22/2014  . S/P cervical spinal fusion 03/26/2014  . Sprain of neck 07/10/2013  . Stiffness of joint, not elsewhere classified, other specified site 07/10/2013  . Breast cancer ([place])   . 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 Breast cancer of upper-outer quadrant of right female breast ([place]) 02/24/2013  . Unspecified constipation 01/22/2013  .  Dyspepsia 01/22/2013  . Vertigo 05/13/2012  . Headache(784.0) 05/13/2012  . Neck pain 05/13/2012  . Menopausal symptoms   . Abscess 06/14/2011  . DYSPHAGIA UNSPECIFIED 09/14/2008  . MENOPAUSE-RELATED VASOMOTOR SYMPTOMS, HOT FLASHES 03/25/2007  . HYPERTHYROIDISM 10/22/2006  . HYPERLIPIDEMIA 10/22/2006  . HYPERTENSION 10/22/2006  . OVERACTIVE BLADDER 10/22/2006  . FIBROCYSTIC BREAST DISEASE 10/22/2006  . SWEATING 10/22/2006  . INSOMNIA, HX OF 10/22/2006    Past Surgical History:  Procedure Laterality Date  . ABDOMINAL HYSTERECTOMY     partial  . ANTERIOR CERVICAL DECOMP/DISCECTOMY FUSION N/A 03/26/2014   Procedure: ANTERIOR CERVICAL DECOMPRESSION/DISCECTOMY FUSION 1 LEVEL;  Surgeon: Eustace Moore, MD;  Location: [place] NEURO ORS;  Service: Neurosurgery;  Laterality: N/A;  ANTERIOR CERVICAL DECOMPRESSION/DISCECTOMY FUSION 1 LEVEL CERVICAL 5-6  . BREAST LUMPECTOMY WITH NEEDLE LOCALIZATION AND AXILLARY SENTINEL LYMPH NODE BX Right 04/03/2013   Procedure: BREAST LUMPECTOMY WITH NEEDLE LOCALIZATION AND AXILLARY SENTINEL LYMPH NODE BX;  Surgeon: Merrie Roof, MD;  Location: [facility];  Service: General;  Laterality: Right;  . CHOLECYSTECTOMY    . COLONOSCOPY  09/17/2008   TFT:DDUK tortuous, but otherwise normal-appearing colon/. Scattered diverticula  . ESOPHAGOGASTRODUODENOSCOPY  09/17/2008   RMR:Two mid esophageal diverticula/Small benign cystic mucosal lesions distal esophagus of doubtful  clinical significance, stable for least 5 years/ Small hiatal hernia, otherwise normal stomach D1, D2  . ESOPHAGOGASTRODUODENOSCOPY    02/10/2003   QPY:PPJKDT esophageal 3 cystic lesions without luminal compromise or evidence/Nonerosive antral gastritis/The esophagus was dilated by passing 56 [place] Maloney dilator.Marland Kitchen Epic notes states +H.pylori gastritis. treatment completed per epic notes.   . ESOPHAGOGASTRODUODENOSCOPY (EGD) WITH ESOPHAGEAL DILATION N/A 02/03/2013   OIZ:TIWPYK esophageal duplication cyst. Small  esophageal diverticulum. Otherwise;  EGD normal - Widely patent tubular esophagus before and after dilation.  Status post passage of  a  Maloney dilator.  . EUS  Aug 2011   Dr. Newman Pies: EGD with multiple lower esophageal submucosal nodules, stomach and duodenum normal, EUS with esophageal duplication cysts  . THYROID SURGERY      OB History    Gravida Para Term Preterm AB Living   4 3           SAB TAB Ectopic Multiple Live Births                   Home Medications    Prior to Admission medications   Medication Sig Start Date End Date Taking? Authorizing Provider  aspirin EC 81 MG tablet Take 81 mg by mouth daily.    [provider]  benzoyl peroxide-erythromycin (BENZAMYCIN) gel Apply 1 application topically 2 (two) times daily.  10/04/13   [provider]  fluticasone (FLONASE) 50 MCG/ACT nasal spray Place 50 sprays into the nose daily as needed for allergies.  07/03/12   [provider]  hydrochlorothiazide (HYDRODIURIL) 25 MG tablet Take 25 mg by mouth daily.      [provider]  ketoconazole (NIZORAL) 2 % cream Apply 1 application topically daily as needed (athlete's foot).  07/07/13   [provider]  levothyroxine (SYNTHROID, LEVOTHROID) 88 MCG tablet Take 88 mcg by mouth daily.      [provider]  naproxen (NAPROSYN) 500 MG tablet Take 1 tablet (500 mg total) by mouth 2 (two) times daily with a meal. 01/03/17   Lynnsie Linders, PA-C  pantoprazole (PROTONIX) 40 MG tablet  05/11/14   [provider]  rosuvastatin (CRESTOR) 5 MG tablet Take 2.5 mg by mouth at bedtime.     [provider]  traMADol (ULTRAM) 50 MG tablet Take 1 tablet (50 mg total) by mouth every 6 (six) hours as needed. 07/22/14   Susanne Borders, NP  traZODone (DESYREL) 50 MG tablet Take 50 mg by mouth at bedtime. Taking 2 at bedtime    [provider]  zolpidem (AMBIEN) 5 MG tablet Take 5 mg by mouth at bedtime as needed for sleep.   01/21/14   [provider]    Family History Family History  Problem Relation Age of Onset  . Lupus Sister   . Hypertension Brother   . Diabetes Brother   . Hypertension Brother   . Diabetes Brother   . Multiple sclerosis Mother   . Cancer Father   . Colon cancer Neg Hx     Social History Social History   Tobacco Use  . Smoking status: Never Smoker  . Smokeless tobacco: Never Used  Substance Use Topics  . Alcohol use: No  . Drug use: No     Allergies   Codeine and Percocet [oxycodone-acetaminophen]   Review of Systems Review of Systems  Constitutional: Negative for chills and fever.  Musculoskeletal: Positive for arthralgias and joint swelling.  Skin: Negative for color change and wound.  Neurological:  Negative for weakness and numbness.  All other systems reviewed and are negative.    Physical Exam Updated Vital Signs BP 138/68 (BP Location: Left Arm)   Pulse 70   Temp 99 F (37.2 C) (Oral)   Resp 16   Wt 77.1 kg (170 lb)   SpO2 96%   BMI 29.18 kg/m   Physical Exam  Constitutional: She is oriented to person, place, and time. She appears well-developed and well-nourished. No distress.  HENT:  Head: Normocephalic and atraumatic.  Cardiovascular: Normal rate, regular rhythm and intact distal pulses.  Pulmonary/Chest: Effort normal and breath sounds normal.  Musculoskeletal: She exhibits edema, tenderness and deformity.  Mallet deformity of the distal end of the right ring finger.  Mild edema noted.  No bruising.  Proximal finger and wrist are non-tender.  No open wound or injury to the nail  Neurological: She is alert and oriented to person, place, and time. No sensory deficit. She exhibits normal muscle tone. Coordination normal.  Skin: Skin is warm and dry. Capillary refill takes less than 2 seconds. No erythema.  Nursing note and vitals reviewed.    ED Treatments / Results  Labs (all labs ordered are listed, but only abnormal results are  displayed) Labs Reviewed - No data to display  EKG  EKG Interpretation None       Radiology Dg Finger Ring Right  Result Date: 01/03/2017 CLINICAL DATA:  Jamming injury of the finger 2 weeks ago with re-injury today EXAM: RIGHT RING FINGER 2+V COMPARISON:  None in PACs FINDINGS: The bones are subjectively adequately mineralized. The joint spaces are reasonably well-maintained. There is subtle cortical irregularity of the tuft of the distal phalanx on the AP and oblique views but appears normal on the lateral view. I cannot exclude a nondisplaced fracture through the tuft. Mild soft tissue swelling is noted over the distal phalanx. IMPRESSION: Possible nondisplaced fracture through the tuft of the distal phalanx. Elsewhere the bones are unremarkable. Electronically Signed   By: David  Martinique M.D.   On: 01/03/2017 12:59    Procedures Procedures (including critical care time)  Medications Ordered in ED Medications - No data to display   Initial Impression / Assessment and Plan / ED Course  I have reviewed the triage vital signs and the nursing notes.  Pertinent labs & imaging results that were available during my care of the patient were reviewed by me and considered in my medical decision making (see chart for details).     Pt with mallet deformity of the finger.  Non-tender at tuft and mechanism of injury of doubtful of fx.  NV intact.    SPLINT APPLICATION Date/Time: 70:35 PM Authorized by: Hale Bogus. Consent: Verbal consent obtained. Risks and benefits: risks, benefits and alternatives were discussed Consent given by: patient Splint applied by: nursing Location details: right ring finger Splint type: finger splint Supplies used: aluminum splint Post-procedure: The splinted body part was neurovascularly unchanged following the procedure. Patient tolerance: Patient tolerated the procedure well with no immediate complications.  Finger splinted in full extension.  Pt  agrees to close orthopedic f/u with Dr. Aline Brochure.     Final Clinical Impressions(s) / ED Diagnoses   Final diagnoses:  Mallet finger of right hand  Closed fracture of tuft of distal phalanx of finger    ED Discharge Orders        Ordered    naproxen (NAPROSYN) 500 MG tablet  2 times daily with meals,   Status:  Discontinued  01/03/17 1314    naproxen (NAPROSYN) 500 MG tablet  2 times daily with meals     01/03/17 1317       Kem Parkinson, PA-C 01/04/17 1246    Isla Pence, MD 01/07/17 [id number]

## 2017-01-29 DIAGNOSIS — G8929 Other chronic pain: Secondary | ICD-10-CM | POA: Insufficient documentation

## 2017-02-12 ENCOUNTER — Other Ambulatory Visit: Payer: Self-pay | Admitting: Oncology

## 2017-02-12 DIAGNOSIS — Z853 Personal history of malignant neoplasm of breast: Secondary | ICD-10-CM

## 2017-03-16 NOTE — Progress Notes (Signed)
This encounter was created in error - please disregard.

## 2017-03-21 ENCOUNTER — Ambulatory Visit
Admission: RE | Admit: 2017-03-21 | Discharge: 2017-03-21 | Disposition: A | Discharge status: Home / Self Care | Payer: Medicare Other | Source: Ambulatory Visit | Attending: Oncology | Admitting: Oncology

## 2017-03-21 DIAGNOSIS — Z853 Personal history of malignant neoplasm of breast: Secondary | ICD-10-CM

## 2017-06-04 DIAGNOSIS — M7581 Other shoulder lesions, right shoulder: Secondary | ICD-10-CM | POA: Insufficient documentation

## 2017-12-12 ENCOUNTER — Ambulatory Visit: Payer: Medicare Other | Admitting: Urology

## 2017-12-12 DIAGNOSIS — R102 Pelvic and perineal pain: Secondary | ICD-10-CM | POA: Diagnosis not present

## 2017-12-12 DIAGNOSIS — R3129 Other microscopic hematuria: Secondary | ICD-10-CM | POA: Diagnosis not present

## 2017-12-29 ENCOUNTER — Emergency Department (HOSPITAL_COMMUNITY): Payer: Medicare Other

## 2017-12-29 ENCOUNTER — Emergency Department (HOSPITAL_COMMUNITY)
Admission: EM | Admit: 2017-12-29 | Discharge: 2017-12-29 | Disposition: A | Discharge status: Home / Self Care | Payer: Medicare Other | Attending: Emergency Medicine | Admitting: Emergency Medicine

## 2017-12-29 ENCOUNTER — Encounter (HOSPITAL_COMMUNITY): Payer: Self-pay | Admitting: Emergency Medicine

## 2017-12-29 ENCOUNTER — Other Ambulatory Visit: Payer: Self-pay

## 2017-12-29 DIAGNOSIS — E039 Hypothyroidism, unspecified: Secondary | ICD-10-CM | POA: Insufficient documentation

## 2017-12-29 DIAGNOSIS — I10 Essential (primary) hypertension: Secondary | ICD-10-CM | POA: Diagnosis not present

## 2017-12-29 DIAGNOSIS — Z7982 Long term (current) use of aspirin: Secondary | ICD-10-CM | POA: Diagnosis not present

## 2017-12-29 DIAGNOSIS — R1030 Lower abdominal pain, unspecified: Secondary | ICD-10-CM | POA: Insufficient documentation

## 2017-12-29 DIAGNOSIS — Z79899 Other long term (current) drug therapy: Secondary | ICD-10-CM | POA: Insufficient documentation

## 2017-12-29 LAB — COMPREHENSIVE METABOLIC PANEL
ALT: 24 U/L (ref 0–44)
AST: 26 U/L (ref 15–41)
Albumin: 3.7 g/dL (ref 3.5–5.0)
Alkaline Phosphatase: 56 U/L (ref 38–126)
Anion gap: 11 (ref 5–15)
BILIRUBIN TOTAL: 0.7 mg/dL (ref 0.3–1.2)
BUN: 7 mg/dL — AB (ref 8–23)
CHLORIDE: 95 mmol/L — AB (ref 98–111)
CO2: 25 mmol/L (ref 22–32)
Calcium: 8.7 mg/dL — ABNORMAL LOW (ref 8.9–10.3)
Creatinine, Ser: 1.04 mg/dL — ABNORMAL HIGH (ref 0.44–1.00)
GFR calc non Af Amer: 55 mL/min — ABNORMAL LOW (ref >60)
Glucose, Bld: 115 mg/dL — ABNORMAL HIGH (ref 70–99)
POTASSIUM: 3 mmol/L — AB (ref 3.5–5.1)
Sodium: 131 mmol/L — ABNORMAL LOW (ref 135–145)
TOTAL PROTEIN: 7.7 g/dL (ref 6.5–8.1)

## 2017-12-29 LAB — URINALYSIS, ROUTINE W REFLEX MICROSCOPIC
Bacteria, UA: NONE SEEN
Bilirubin Urine: NEGATIVE
GLUCOSE, UA: NEGATIVE mg/dL
Ketones, ur: NEGATIVE mg/dL
LEUKOCYTES UA: NEGATIVE
NITRITE: NEGATIVE
Protein, ur: NEGATIVE mg/dL
Specific Gravity, Urine: 1.01 (ref 1.005–1.030)
pH: 7 (ref 5.0–8.0)

## 2017-12-29 LAB — CBC
HEMATOCRIT: 40.4 % (ref 36.0–46.0)
HEMOGLOBIN: 12.4 g/dL (ref 12.0–15.0)
MCH: 26.7 pg (ref 26.0–34.0)
MCHC: 30.7 g/dL (ref 30.0–36.0)
MCV: 87.1 fL (ref 80.0–100.0)
Platelets: 313 10*3/uL (ref 150–400)
RBC: 4.64 MIL/uL (ref 3.87–5.11)
RDW: 12.6 % (ref 11.5–15.5)
WBC: 7.9 10*3/uL (ref 4.0–10.5)
nRBC: 0 % (ref 0.0–0.2)

## 2017-12-29 LAB — POC OCCULT BLOOD, ED: Fecal Occult Bld: NEGATIVE

## 2017-12-29 LAB — LIPASE, BLOOD: LIPASE: 38 U/L (ref 11–51)

## 2017-12-29 MED ORDER — IOHEXOL 300 MG/ML  SOLN
100.0000 mL | Freq: Once | INTRAMUSCULAR | Status: AC | PRN
  Administered 2017-12-29: 100 mL via INTRAVENOUS

## 2017-12-29 MED ORDER — SODIUM CHLORIDE 0.9 % IV BOLUS
1000.0000 mL | Freq: Once | INTRAVENOUS | Status: AC
  Administered 2017-12-29: 1000 mL via INTRAVENOUS

## 2017-12-29 MED ORDER — PANTOPRAZOLE SODIUM 40 MG PO TBEC: 40.0000 mg | DELAYED_RELEASE_TABLET | Freq: Every day | ORAL | 0 refills | Status: AC

## 2017-12-29 NOTE — ED Notes (Signed)
Patient verbalizes understanding of discharge instructions. Opportunity for questioning and answers were provided. Armband removed by staff, pt discharged from ED.  

## 2017-12-29 NOTE — ED Provider Notes (Signed)
[facility] DEPARTMENT Provider Note   CSN: [id number] Arrival date & time: 12/29/17  [place]     History   Chief Complaint Chief Complaint  Patient presents with  . Abdominal Pain    HPI [name] is a 67 y.o. female.  HPI  67 year old female presents with abdominal pain.  Patient has been having vague on and off abdominal pain that is bilateral for about 6 days.  A little worse today.  About a 5 out of 10 in intensity and feels like a burning.  Nothing specific makes it come and go but it is not there all day.  No nausea or vomiting.  No diarrhea specifically but her stools are softer and a little more frequent than typical.  Today she is had 2 bowel movements and after the second 1 she had some blood when wiping.  She does not think there was blood in the stool.  No fevers or back pain.  No urinary symptoms. Tried pantoprazole earlier today. At times her abdomen has felt "bubbly".  Past Medical History:  Diagnosis Date  . Arthritis   . Breast cancer ([place]) 02/19/13  . Chronic back pain    reason unknown  . Constipation    doesn't take any medss  . Diverticulosis   . Headache(784.0)    occasionally  . History of bladder infections   . History of blood transfusion    no abnormal reaction noted  . History of colon polyps   . Hx of radiation therapy 05/13/13- 06/16/13   right breast 5000 cGy in 25 sessions  . Hypercholesteremia    takes Crestor daily  . Hypertension    takes HCTZ daily  . Hypothyroid    takes Synthroid daily  . Insomnia    takes Ambien nightly as needed  . Neck pain 05/13/2012   HNP  . Neuropathy   . Vertigo    doesn't take any meds    Patient Active Problem List   Diagnosis Date Noted  . Lymphedema 07/22/2014  . S/P cervical spinal fusion 03/26/2014  . Sprain of neck 07/10/2013  . Stiffness of joint, not elsewhere classified, other specified site 07/10/2013  . Breast cancer ([place])   . 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 Breast cancer of upper-outer quadrant of right female breast ([place]) 02/24/2013  . Unspecified constipation 01/22/2013  . Dyspepsia 01/22/2013  . Vertigo 05/13/2012  . Headache(784.0) 05/13/2012  . Neck pain 05/13/2012  . Menopausal symptoms   . Abscess 06/14/2011  . DYSPHAGIA UNSPECIFIED 09/14/2008  . MENOPAUSE-RELATED VASOMOTOR SYMPTOMS, HOT FLASHES 03/25/2007  . HYPERTHYROIDISM 10/22/2006  . HYPERLIPIDEMIA 10/22/2006  . HYPERTENSION 10/22/2006  . OVERACTIVE BLADDER 10/22/2006  . FIBROCYSTIC BREAST DISEASE 10/22/2006  . SWEATING 10/22/2006  . INSOMNIA, HX OF 10/22/2006    Past Surgical History:  Procedure Laterality Date  . ABDOMINAL HYSTERECTOMY     partial  . ANTERIOR CERVICAL DECOMP/DISCECTOMY FUSION N/A 03/26/2014   Procedure: ANTERIOR CERVICAL DECOMPRESSION/DISCECTOMY FUSION 1 LEVEL;  Surgeon: Eustace Moore, MD;  Location: [facility];  Service: Neurosurgery;  Laterality: N/A;  ANTERIOR CERVICAL DECOMPRESSION/DISCECTOMY FUSION 1 LEVEL CERVICAL 5-6  . BREAST LUMPECTOMY Right 04/03/2013  . BREAST LUMPECTOMY WITH NEEDLE LOCALIZATION AND AXILLARY SENTINEL LYMPH NODE BX Right 04/03/2013   Procedure: BREAST LUMPECTOMY WITH NEEDLE LOCALIZATION AND AXILLARY SENTINEL LYMPH NODE BX;  Surgeon: Merrie Roof, MD;  Location: [facility];  Service: General;  Laterality: Right;  . CHOLECYSTECTOMY    .  COLONOSCOPY  09/17/2008   FFM:BWGY tortuous, but otherwise normal-appearing colon/. Scattered diverticula  . ESOPHAGOGASTRODUODENOSCOPY  09/17/2008   RMR:Two mid esophageal diverticula/Small benign cystic mucosal lesions distal esophagus of doubtful  clinical significance, stable for least 5 years/ Small hiatal hernia, otherwise normal stomach D1, D2  . ESOPHAGOGASTRODUODENOSCOPY    02/10/2003   KZL:DJTTSV esophageal 3 cystic lesions without luminal compromise or evidence/Nonerosive antral gastritis/The esophagus was dilated by passing 56 [place] Maloney dilator.Marland Kitchen Epic  notes states +H.pylori gastritis. treatment completed per epic notes.   . ESOPHAGOGASTRODUODENOSCOPY (EGD) WITH ESOPHAGEAL DILATION N/A 02/03/2013   XBL:TJQZES esophageal duplication cyst. Small esophageal diverticulum. Otherwise;  EGD normal - Widely patent tubular esophagus before and after dilation.  Status post passage of  a  Maloney dilator.  . EUS  Aug 2011   Dr. Newman Pies: EGD with multiple lower esophageal submucosal nodules, stomach and duodenum normal, EUS with esophageal duplication cysts  . THYROID SURGERY       OB History    Gravida  4   Para  3   Term      Preterm      AB      Living        SAB      TAB      Ectopic      Multiple      Live Births               Home Medications    Prior to Admission medications   Medication Sig Start Date End Date Taking? Authorizing Provider  aspirin EC 81 MG tablet Take 81 mg by mouth daily.    [provider]  benzoyl peroxide-erythromycin (BENZAMYCIN) gel Apply 1 application topically 2 (two) times daily.  10/04/13   [provider]  fluticasone (FLONASE) 50 MCG/ACT nasal spray Place 50 sprays into the nose daily as needed for allergies.  07/03/12   [provider]  hydrochlorothiazide (HYDRODIURIL) 25 MG tablet Take 25 mg by mouth daily.      [provider]  ketoconazole (NIZORAL) 2 % cream Apply 1 application topically daily as needed (athlete's foot).  07/07/13   [provider]  levothyroxine (SYNTHROID, LEVOTHROID) 88 MCG tablet Take 88 mcg by mouth daily.      [provider]  naproxen (NAPROSYN) 500 MG tablet Take 1 tablet (500 mg total) by mouth 2 (two) times daily with a meal. 01/03/17   Triplett, Tammy, PA-C  pantoprazole (PROTONIX) 40 MG tablet Take 1 tablet (40 mg total) by mouth daily. 12/29/17   Sherwood Gambler, MD  rosuvastatin (CRESTOR) 5 MG tablet Take 2.5 mg by mouth at bedtime.     [provider]  traMADol (ULTRAM) 50 MG tablet Take 1 tablet (50  mg total) by mouth every 6 (six) hours as needed. 07/22/14   Susanne Borders, NP  traZODone (DESYREL) 50 MG tablet Take 50 mg by mouth at bedtime. Taking 2 at bedtime    [provider]  zolpidem (AMBIEN) 5 MG tablet Take 5 mg by mouth at bedtime as needed for sleep.  01/21/14   [provider]    Family History Family History  Problem Relation Age of Onset  . Lupus Sister   . Hypertension Brother   . Diabetes Brother   . Hypertension Brother   . Diabetes Brother   . Multiple sclerosis Mother   . Cancer Father   . Breast cancer Daughter 8  . Colon cancer Neg Hx  Social History Social History   Tobacco Use  . Smoking status: Never Smoker  . Smokeless tobacco: Never Used  Substance Use Topics  . Alcohol use: No  . Drug use: No     Allergies   Codeine and Percocet [oxycodone-acetaminophen]   Review of Systems Review of Systems  Constitutional: Negative for fever.  Cardiovascular: Negative for chest pain.  Gastrointestinal: Positive for abdominal pain and blood in stool. Negative for diarrhea, nausea and vomiting.  Genitourinary: Negative for dysuria.  Musculoskeletal: Negative for back pain.  All other systems reviewed and are negative.    Physical Exam Updated Vital Signs BP 105/61   Pulse (!) 101   Temp 98 F (36.7 C) (Oral)   Resp 18   Ht 5\' 4"  (1.626 m)   Wt 77.1 kg   SpO2 97%   BMI 29.18 kg/m   Physical Exam  Constitutional: She appears well-developed and well-nourished.  HENT:  Head: Normocephalic and atraumatic.  Right Ear: External ear normal.  Left Ear: External ear normal.  Nose: Nose normal.  Eyes: Right eye exhibits no discharge. Left eye exhibits no discharge.  Cardiovascular: Normal rate, regular rhythm and normal heart sounds.  Pulmonary/Chest: Effort normal and breath sounds normal.  Abdominal: Soft. There is tenderness in the right lower quadrant and left lower quadrant. There is no CVA tenderness.    Genitourinary: Rectal exam shows no external hemorrhoid, no internal hemorrhoid, no mass and guaiac negative stool.  Genitourinary Comments: No gross blood or stool on exam. There is a small area of irritated/friable skin without fissure at 12 oclock.   Neurological: She is alert.  Skin: Skin is warm and dry.  Psychiatric: Her mood appears not anxious.  Nursing note and vitals reviewed.    ED Treatments / Results  Labs (all labs ordered are listed, but only abnormal results are displayed) Labs Reviewed  COMPREHENSIVE METABOLIC PANEL - Abnormal; Notable for the following components:      Result Value   Sodium 131 (*)    Potassium 3.0 (*)    Chloride 95 (*)    Glucose, Bld 115 (*)    BUN 7 (*)    Creatinine, Ser 1.04 (*)    Calcium 8.7 (*)    GFR calc non Af Amer 55 (*)    All other components within normal limits  URINALYSIS, ROUTINE W REFLEX MICROSCOPIC - Abnormal; Notable for the following components:   Color, Urine STRAW (*)    Hgb urine dipstick SMALL (*)    All other components within normal limits  LIPASE, BLOOD  CBC  POC OCCULT BLOOD, ED    EKG None  Radiology Ct Abdomen Pelvis W Contrast  Result Date: 12/29/2017 CLINICAL DATA:  Abdominal pain with intermittent blood in stool. History of colonic polyps. EXAM: CT ABDOMEN AND PELVIS WITH CONTRAST TECHNIQUE: Multidetector CT imaging of the abdomen and pelvis was performed using the standard protocol following bolus administration of intravenous contrast. CONTRAST:  179mL OMNIPAQUE IOHEXOL 300 MG/ML  SOLN COMPARISON:  November 20, 2012 FINDINGS: Lower chest: No acute abnormality. Hepatobiliary: No focal liver abnormality is seen. Status post cholecystectomy. No biliary dilatation. Pancreas: Unremarkable. No pancreatic ductal dilatation or surrounding inflammatory changes. Spleen: Normal in size without focal abnormality. Adrenals/Urinary Tract: There is a small cyst in the right kidney on axial image 25, unchanged. The  adrenal glands, kidneys, ureters, and bladder are otherwise normal. Stomach/Bowel: The stomach and small bowel are normal. Scattered colonic diverticulosis without diverticulitis. The visualized appendix is normal.  Vascular/Lymphatic: No significant vascular findings are present. No enlarged abdominal or pelvic lymph nodes. Reproductive: Uterus and bilateral adnexa are unremarkable. Other: No abdominal wall hernia or abnormality. No abdominopelvic ascites. Musculoskeletal: No acute or significant osseous findings. IMPRESSION: 1. Diverticulosis. No other cause for blood in stool identified on this study. Colonoscopy would of course be more sensitive and specific for underlying colonic abnormalities. 2. No other significant abnormalities. Electronically Signed   By: Dorise Bullion III M.D   On: 12/29/2017 22:06    Procedures Procedures (including critical care time)  Medications Ordered in ED Medications  sodium chloride 0.9 % bolus 1,000 mL (0 mLs Intravenous Stopped 12/29/17 2329)  iohexol (OMNIPAQUE) 300 MG/ML solution 100 mL (100 mLs Intravenous Contrast Given 12/29/17 2136)     Initial Impression / Assessment and Plan / ED Course  I have reviewed the triage vital signs and the nursing notes.  Pertinent labs & imaging results that were available during my care of the patient were reviewed by me and considered in my medical decision making (see chart for details).     Given patient's persistent mild lower abdominal pain a CT was obtained.  This is not showing acute emergent condition such as diverticulitis.  It appears that the blood she had was more when she wipes and in her stool.  There is some friable tissue, likely from increased stools and increased wiping she has caused some mild local trauma.  I highly doubt acute GI bleed.  Her labs are all reassuring.  She has declined medicine for pain.  At this point, she will be treated with a PPI as she is supposed to be on this anyway and  follow-up with PCP.  We discussed return precautions.  Final Clinical Impressions(s) / ED Diagnoses   Final diagnoses:  Lower abdominal pain    ED Discharge Orders         Ordered    pantoprazole (PROTONIX) 40 MG tablet  Daily     12/29/17 2318           Sherwood Gambler, MD 12/29/17 2342

## 2017-12-29 NOTE — Discharge Instructions (Signed)
If you develop severe or worsening abdominal pain, vomiting or vomiting blood, fever, bloody diarrhea or blood in the stool, or any other new/concerning symptoms then return to the ER for evaluation.

## 2017-12-29 NOTE — ED Triage Notes (Signed)
Pt arrives with abd pain since Monday, reports "bubbling" sensation. No Nausea or vomiting, but reporting some diarrhea with bright red blood when wiping. Reports taking meds for acid reflex.

## 2017-12-29 NOTE — ED Notes (Signed)
Patient transported to CT 

## 2018-01-14 ENCOUNTER — Encounter: Payer: Self-pay | Admitting: Internal Medicine

## 2018-02-08 ENCOUNTER — Other Ambulatory Visit: Payer: Self-pay | Admitting: Oncology

## 2018-02-08 DIAGNOSIS — Z853 Personal history of malignant neoplasm of breast: Secondary | ICD-10-CM

## 2018-02-08 DIAGNOSIS — Z9889 Other specified postprocedural states: Secondary | ICD-10-CM

## 2018-03-04 DIAGNOSIS — D649 Anemia, unspecified: Secondary | ICD-10-CM | POA: Insufficient documentation

## 2018-03-21 ENCOUNTER — Other Ambulatory Visit: Payer: Self-pay | Admitting: Oncology

## 2018-03-21 ENCOUNTER — Other Ambulatory Visit: Payer: Self-pay

## 2018-03-21 DIAGNOSIS — Z9889 Other specified postprocedural states: Secondary | ICD-10-CM

## 2018-03-21 DIAGNOSIS — Z853 Personal history of malignant neoplasm of breast: Secondary | ICD-10-CM

## 2018-03-22 ENCOUNTER — Other Ambulatory Visit: Payer: Self-pay | Admitting: Internal Medicine

## 2018-03-25 ENCOUNTER — Ambulatory Visit
Admission: RE | Admit: 2018-03-25 | Discharge: 2018-03-25 | Disposition: A | Discharge status: Home / Self Care | Payer: Medicare Other | Source: Ambulatory Visit | Attending: Oncology | Admitting: Oncology

## 2018-03-25 DIAGNOSIS — Z9889 Other specified postprocedural states: Secondary | ICD-10-CM

## 2018-03-25 DIAGNOSIS — Z853 Personal history of malignant neoplasm of breast: Secondary | ICD-10-CM

## 2018-04-01 ENCOUNTER — Other Ambulatory Visit: Payer: Self-pay

## 2018-04-01 ENCOUNTER — Ambulatory Visit: Payer: Medicare Other | Admitting: Gastroenterology

## 2018-04-01 ENCOUNTER — Encounter: Payer: Self-pay | Admitting: Gastroenterology

## 2018-04-01 DIAGNOSIS — K625 Hemorrhage of anus and rectum: Secondary | ICD-10-CM

## 2018-04-01 MED ORDER — PEG 3350-KCL-NA BICARB-NACL 420 G PO SOLR: 4000.0000 mL | ORAL | 0 refills | Status: DC

## 2018-04-01 NOTE — Assessment & Plan Note (Signed)
Very pleasant 68 year old female presenting for further evaluation of abdominal pain and rectal bleeding.  This occurred back in November.  She went to the ED for evaluation.  CT showed diverticulosis but otherwise unremarkable.  She had a single episode of rectal bleeding which is since resolved.  Bowel function normally pretty good, some increased stool frequency with certain foods.  Her last colonoscopy was in August 2010.  She had scattered diverticula.  Recommend updating colonoscopy at this time.  I have discussed the risks, alternatives, benefits with regards to but not limited to the risk of reaction to medication, bleeding, infection, perforation and the patient is agreeable to proceed. Written consent to be obtained.

## 2018-04-01 NOTE — Patient Instructions (Signed)
1. Colonoscopy as scheduled. Please see separate instructions. 

## 2018-04-01 NOTE — Progress Notes (Signed)
CC'D TO PCP °

## 2018-04-01 NOTE — Progress Notes (Signed)
Primary Care Physician:  Asencion Noble, MD  Primary Gastroenterologist:  Garfield Cornea, MD   Chief Complaint  Patient presents with  . Abdominal Pain    none since November    HPI:  [name] is a 68 y.o. female here for further evaluation of abdominal pain.  We last saw her in 12/2012 for odynophagia/dysphagia.  She has a history of small benign cystic mucosal lesions in the distal esophagus.  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  Her last EGD was in 2015.  She had stable esophageal duplication cyst, small esophageal diverticulum, widely patent tubular esophagus before and after dilation.  Last colonoscopy in 2010.  She had a long tortuous colon but otherwise normal except for scattered diverticula.  Patient developed abdominal pain and rectal bleeding back in November.  She reports the symptoms were acute onset after eating a chicken biscuit and drinking cherry wine.  Started having abdominal pain.  Had a BM and saw bright red blood per rectum on the toilet tissue and decided to go to the ED for evaluation.  CT abdomen pelvis with contrast showed diverticulosis but no other significant abnormalities.  CBC was unremarkable.  LFTs and lipase normal.  Heme-negative.  Rectal exam showed small area of irritated/friable skin without fissure at 12:00.  No further rectal bleeding. Generally good BMs. No straining. Spicy foods cause increased stool frequency. Some acid reflux. Started back on pantoprazole in 11/2017 when abdominal pain occurred. No dysphagia.   Current Outpatient Medications  Medication Sig Dispense Refill  . gabapentin (NEURONTIN) 100 MG capsule Take 100 mg by mouth 2 (two) times daily.    . hydrochlorothiazide (HYDRODIURIL) 25 MG tablet Take 25 mg by mouth daily.      Marland Kitchen levothyroxine (SYNTHROID, LEVOTHROID) 88 MCG tablet Take 75 mcg by mouth daily.     . pantoprazole (PROTONIX) 40 MG tablet Take 1 tablet (40 mg total) by mouth daily.  (Patient taking differently: Take 40 mg by mouth daily as needed. ) 14 tablet 0  . potassium chloride SA (K-DUR,KLOR-CON) 20 MEQ tablet Take 20 mEq by mouth daily.    . rosuvastatin (CRESTOR) 5 MG tablet Take 2.5 mg by mouth at bedtime.     Marland Kitchen zolpidem (AMBIEN) 5 MG tablet Take 5 mg by mouth at bedtime as needed for sleep.   3   No current facility-administered medications for this visit.     Allergies as of 04/01/2018 - Review Complete 04/01/2018  Allergen Reaction Noted  . Codeine Nausea And Vomiting   . Percocet [oxycodone-acetaminophen] Rash 12/04/2010    Past Medical History:  Diagnosis Date  . Arthritis   . Breast cancer ([place]) 02/19/13  . Chronic back pain    reason unknown  . Constipation    doesn't take any medss  . Diverticulosis   . Headache(784.0)    occasionally  . History of bladder infections   . History of blood transfusion    no abnormal reaction noted  . History of colon polyps   . Hx of radiation therapy 05/13/13- 06/16/13   right breast 5000 cGy in 25 sessions  . Hypercholesteremia    takes Crestor daily  . Hypertension    takes HCTZ daily  . Hypothyroid    takes Synthroid daily  . Insomnia    takes Ambien nightly as needed  . Neck pain 05/13/2012   HNP  . Neuropathy   . Vertigo    doesn't take any meds  Past Surgical History:  Procedure Laterality Date  . ABDOMINAL HYSTERECTOMY  1983   partial  . ANTERIOR CERVICAL DECOMP/DISCECTOMY FUSION N/A 03/26/2014   Procedure: ANTERIOR CERVICAL DECOMPRESSION/DISCECTOMY FUSION 1 LEVEL;  Surgeon: Eustace Moore, MD;  Location: [facility];  Service: Neurosurgery;  Laterality: N/A;  ANTERIOR CERVICAL DECOMPRESSION/DISCECTOMY FUSION 1 LEVEL CERVICAL 5-6  . BREAST LUMPECTOMY Right 04/03/2013  . BREAST LUMPECTOMY WITH NEEDLE LOCALIZATION AND AXILLARY SENTINEL LYMPH NODE BX Right 04/03/2013   Procedure: BREAST LUMPECTOMY WITH NEEDLE LOCALIZATION AND AXILLARY SENTINEL LYMPH NODE BX;  Surgeon: Merrie Roof, MD;   Location: [facility];  Service: General;  Laterality: Right;  . CHOLECYSTECTOMY  1994  . COLONOSCOPY  09/17/2008   WVP:XTGG tortuous, but otherwise normal-appearing colon/. Scattered diverticula  . ESOPHAGOGASTRODUODENOSCOPY  09/17/2008   RMR:Two mid esophageal diverticula/Small benign cystic mucosal lesions distal esophagus of doubtful  clinical significance, stable for least 5 years/ Small hiatal hernia, otherwise normal stomach D1, D2  . ESOPHAGOGASTRODUODENOSCOPY    02/10/2003   YIR:SWNIOE esophageal 3 cystic lesions without luminal compromise or evidence/Nonerosive antral gastritis/The esophagus was dilated by passing 56 [place] Maloney dilator.Marland Kitchen Epic notes states +H.pylori gastritis. treatment completed per epic notes.   . ESOPHAGOGASTRODUODENOSCOPY (EGD) WITH ESOPHAGEAL DILATION N/A 02/03/2013   VOJ:JKKXFG esophageal duplication cyst. Small esophageal diverticulum. Otherwise;  EGD normal - Widely patent tubular esophagus before and after dilation.  Status post passage of  a  Maloney dilator.  . EUS  Aug 2011   Dr. Newman Pies: EGD with multiple lower esophageal submucosal nodules, stomach and duodenum normal, EUS with esophageal duplication cysts  . THYROID SURGERY  2011    Family History  Problem Relation Age of Onset  . Lupus Sister   . Hypertension Brother   . Diabetes Brother   . Hypertension Brother   . Diabetes Brother   . Multiple sclerosis Mother   . Cancer Father        "stomach"  . Breast cancer Daughter 72  . Colon cancer Neg Hx     Social History   Socioeconomic History  . Marital status: Married    Spouse name: Joe  . Number of children: 3  . Years of education: 69  . Highest education level: Not on file  Occupational History  . Occupation: Retired  Scientific laboratory technician  . Financial resource strain: Not on file  . Food insecurity:    Worry: Not on file    Inability: Not on file  . Transportation needs:    Medical: Not on file    Non-medical: Not on file  Tobacco Use  .  Smoking status: Never Smoker  . Smokeless tobacco: Never Used  Substance and Sexual Activity  . Alcohol use: No  . Drug use: No  . Sexual activity: Yes    Birth control/protection: Surgical    Comment: menarche age 22, P65, first live birth age 43, menopause 57, no HRT, HYST  Lifestyle  . Physical activity:    Days per week: Not on file    Minutes per session: Not on file  . Stress: Not on file  Relationships  . Social connections:    Talks on phone: Not on file    Gets together: Not on file    Attends religious service: Not on file    Active member of club or organization: Not on file    Attends meetings of clubs or organizations: Not on file    Relationship status: Not on file  . Intimate partner  violence:    Fear of current or ex partner: Not on file    Emotionally abused: Not on file    Physically abused: Not on file    Forced sexual activity: Not on file  Other Topics Concern  . Not on file  Social History Narrative   Patient lives at home with her husband Mariyam Remington.    Patient has 3 children.    Patient is retired.    Patient has an 11th grade education.             ROS:  General: Negative for anorexia, weight loss, fever, chills, fatigue, weakness. Eyes: Negative for vision changes.  ENT: Negative for hoarseness, difficulty swallowing , nasal congestion. CV: Negative for chest pain, angina, palpitations, dyspnea on exertion, peripheral edema.  Respiratory: Negative for dyspnea at rest, dyspnea on exertion, cough, sputum, wheezing.  GI: See history of present illness. GU:  Negative for dysuria, hematuria, urinary incontinence, urinary frequency, nocturnal urination.  MS: Negative for joint pain, positive low back pain.  Derm: Negative for rash or itching.  Neuro: Negative for weakness, abnormal sensation, seizure, frequent headaches, memory loss, confusion.  Psych: Negative for anxiety, depression, suicidal ideation, hallucinations.  Endo: Negative for  unusual weight change.  Heme: Negative for bruising or bleeding. Allergy: Negative for rash or hives.    Physical Examination:  BP 138/79   Pulse 81   Temp (!) 97.5 F (36.4 C) (Oral)   Ht 5\' 6"  (1.676 m)   Wt 172 lb 12.8 oz (78.4 kg)   BMI 27.89 kg/m    General: Well-nourished, well-developed in no acute distress.  Head: Normocephalic, atraumatic.   Eyes: Conjunctiva pink, no icterus. Mouth: Oropharyngeal mucosa moist and pink , no lesions erythema or exudate. Neck: Supple without thyromegaly, masses, or lymphadenopathy.  Lungs: Clear to auscultation bilaterally.  Heart: Regular rate and rhythm, no murmurs rubs or gallops.  Abdomen: Bowel sounds are normal, nontender, nondistended, no hepatosplenomegaly or masses, no abdominal bruits or    hernia , no rebound or guarding.   Rectal: Not performed Extremities: No lower extremity edema. No clubbing or deformities.  Neuro: Alert and oriented x 4 , grossly normal neurologically.  Skin: Warm and dry, no rash or jaundice.   Psych: Alert and cooperative, normal mood and affect.  Labs: Lab Results  Component Value Date   LIPASE 38 12/29/2017   Lab Results  Component Value Date   CREATININE 1.04 (H) 12/29/2017   BUN 7 (L) 12/29/2017   NA 131 (L) 12/29/2017   K 3.0 (L) 12/29/2017   CL 95 (L) 12/29/2017   CO2 25 12/29/2017   Lab Results  Component Value Date   WBC 7.9 12/29/2017   HGB 12.4 12/29/2017   HCT 40.4 12/29/2017   MCV 87.1 12/29/2017   PLT 313 12/29/2017    Lab Results  Component Value Date   ALT 24 12/29/2017   AST 26 12/29/2017   ALKPHOS 56 12/29/2017   BILITOT 0.7 12/29/2017     Imaging Studies: Mm Diag Breast Tomo Bilateral  Result Date: 03/25/2018 CLINICAL DATA:  68 year old female presenting for routine annual surveillance status post right breast lumpectomy in 2015. EXAM: DIGITAL DIAGNOSTIC BILATERAL MAMMOGRAM WITH CAD AND TOMO COMPARISON:  Previous exam(s). ACR Breast Density Category b: There  are scattered areas of fibroglandular density. FINDINGS: The right breast lumpectomy site is stable. No suspicious calcifications, masses or areas of distortion are seen in the bilateral breasts. Mammographic images were processed with CAD. IMPRESSION:  Stable right breast lumpectomy site. No mammographic evidence of malignancy in the bilateral breasts. RECOMMENDATION: Screening mammogram in one year.(Code:SM-B-01Y) I have discussed the findings and recommendations with the patient. Results were also provided in writing at the conclusion of the visit. If applicable, a reminder letter will be sent to the patient regarding the next appointment. BI-RADS CATEGORY  2: Benign. Electronically Signed   By: Ammie Ferrier M.D.   On: 03/25/2018 10:08   CLINICAL DATA:  Abdominal pain with intermittent blood in stool. History of colonic polyps.  EXAM: CT ABDOMEN AND PELVIS WITH CONTRAST  TECHNIQUE: Multidetector CT imaging of the abdomen and pelvis was performed using the standard protocol following bolus administration of intravenous contrast.  CONTRAST:  14mL OMNIPAQUE IOHEXOL 300 MG/ML  SOLN  COMPARISON:  November 20, 2012  FINDINGS: Lower chest: No acute abnormality.  Hepatobiliary: No focal liver abnormality is seen. Status post cholecystectomy. No biliary dilatation.  Pancreas: Unremarkable. No pancreatic ductal dilatation or surrounding inflammatory changes.  Spleen: Normal in size without focal abnormality.  Adrenals/Urinary Tract: There is a small cyst in the right kidney on axial image 25, unchanged. The adrenal glands, kidneys, ureters, and bladder are otherwise normal.  Stomach/Bowel: The stomach and small bowel are normal. Scattered colonic diverticulosis without diverticulitis. The visualized appendix is normal.  Vascular/Lymphatic: No significant vascular findings are present. No enlarged abdominal or pelvic lymph nodes.  Reproductive: Uterus and bilateral  adnexa are unremarkable.  Other: No abdominal wall hernia or abnormality. No abdominopelvic ascites.  Musculoskeletal: No acute or significant osseous findings.  IMPRESSION: 1. Diverticulosis. No other cause for blood in stool identified on this study. Colonoscopy would of course be more sensitive and specific for underlying colonic abnormalities. 2. No other significant abnormalities.   Electronically Signed   By: Dorise Bullion III M.D   On: 12/29/2017 22:06

## 2018-04-09 ENCOUNTER — Emergency Department (HOSPITAL_COMMUNITY): Payer: Medicare Other

## 2018-04-09 ENCOUNTER — Other Ambulatory Visit: Payer: Self-pay

## 2018-04-09 ENCOUNTER — Emergency Department (HOSPITAL_COMMUNITY)
Admission: EM | Admit: 2018-04-09 | Discharge: 2018-04-10 | Disposition: A | Discharge status: Home / Self Care | Payer: Medicare Other | Attending: Emergency Medicine | Admitting: Emergency Medicine

## 2018-04-09 ENCOUNTER — Encounter (HOSPITAL_COMMUNITY): Payer: Self-pay | Admitting: *Deleted

## 2018-04-09 DIAGNOSIS — E039 Hypothyroidism, unspecified: Secondary | ICD-10-CM | POA: Diagnosis not present

## 2018-04-09 DIAGNOSIS — Z79899 Other long term (current) drug therapy: Secondary | ICD-10-CM | POA: Insufficient documentation

## 2018-04-09 DIAGNOSIS — J189 Pneumonia, unspecified organism: Secondary | ICD-10-CM | POA: Insufficient documentation

## 2018-04-09 DIAGNOSIS — Z853 Personal history of malignant neoplasm of breast: Secondary | ICD-10-CM | POA: Diagnosis not present

## 2018-04-09 DIAGNOSIS — I10 Essential (primary) hypertension: Secondary | ICD-10-CM | POA: Diagnosis not present

## 2018-04-09 DIAGNOSIS — R05 Cough: Secondary | ICD-10-CM | POA: Diagnosis present

## 2018-04-09 LAB — CBC WITH DIFFERENTIAL/PLATELET
Abs Immature Granulocytes: 0.05 10*3/uL (ref 0.00–0.07)
BASOS ABS: 0 10*3/uL (ref 0.0–0.1)
Basophils Relative: 0 %
Eosinophils Absolute: 0.1 10*3/uL (ref 0.0–0.5)
Eosinophils Relative: 1 %
HCT: 37.6 % (ref 36.0–46.0)
Hemoglobin: 11.6 g/dL — ABNORMAL LOW (ref 12.0–15.0)
Immature Granulocytes: 1 %
Lymphocytes Relative: 12 %
Lymphs Abs: 1.3 10*3/uL (ref 0.7–4.0)
MCH: 27.4 pg (ref 26.0–34.0)
MCHC: 30.9 g/dL (ref 30.0–36.0)
MCV: 88.9 fL (ref 80.0–100.0)
Monocytes Absolute: 0.5 10*3/uL (ref 0.1–1.0)
Monocytes Relative: 5 %
NRBC: 0 % (ref 0.0–0.2)
Neutro Abs: 8.3 10*3/uL — ABNORMAL HIGH (ref 1.7–7.7)
Neutrophils Relative %: 81 %
Platelets: 282 10*3/uL (ref 150–400)
RBC: 4.23 MIL/uL (ref 3.87–5.11)
RDW: 12.7 % (ref 11.5–15.5)
WBC: 10.2 10*3/uL (ref 4.0–10.5)

## 2018-04-09 MED ORDER — ACETAMINOPHEN 325 MG PO TABS
650.0000 mg | ORAL_TABLET | Freq: Once | ORAL | Status: AC
  Administered 2018-04-09: 650 mg via ORAL

## 2018-04-09 MED ORDER — ACETAMINOPHEN 325 MG PO TABS
325.0000 mg | ORAL_TABLET | Freq: Once | ORAL | Status: DC
  Filled 2018-04-09: qty 1

## 2018-04-09 MED ORDER — SODIUM CHLORIDE 0.9% FLUSH: 3.0000 mL | Freq: Once | INTRAVENOUS | Status: DC

## 2018-04-09 NOTE — ED Triage Notes (Signed)
Onset of coughing last night (productive yellow mucous). Tmax at home 102.2 today. Mucinex DM. Pt says she does feel fatigued and headaches.

## 2018-04-10 ENCOUNTER — Encounter (HOSPITAL_COMMUNITY): Payer: Self-pay | Admitting: Emergency Medicine

## 2018-04-10 LAB — COMPREHENSIVE METABOLIC PANEL
ALT: 21 U/L (ref 0–44)
AST: 29 U/L (ref 15–41)
Albumin: 3.7 g/dL (ref 3.5–5.0)
Alkaline Phosphatase: 54 U/L (ref 38–126)
Anion gap: 12 (ref 5–15)
BUN: 9 mg/dL (ref 8–23)
CHLORIDE: 97 mmol/L — AB (ref 98–111)
CO2: 22 mmol/L (ref 22–32)
Calcium: 8.8 mg/dL — ABNORMAL LOW (ref 8.9–10.3)
Creatinine, Ser: 0.89 mg/dL (ref 0.44–1.00)
GFR calc Af Amer: >60 mL/min (ref >60)
GFR calc non Af Amer: >60 mL/min (ref >60)
Glucose, Bld: 112 mg/dL — ABNORMAL HIGH (ref 70–99)
Potassium: 3.4 mmol/L — ABNORMAL LOW (ref 3.5–5.1)
Sodium: 131 mmol/L — ABNORMAL LOW (ref 135–145)
Total Bilirubin: 0.5 mg/dL (ref 0.3–1.2)
Total Protein: 7.1 g/dL (ref 6.5–8.1)

## 2018-04-10 LAB — URINALYSIS, ROUTINE W REFLEX MICROSCOPIC
Bilirubin Urine: NEGATIVE
Glucose, UA: NEGATIVE mg/dL
Ketones, ur: NEGATIVE mg/dL
Nitrite: NEGATIVE
PH: 6 (ref 5.0–8.0)
Protein, ur: NEGATIVE mg/dL
SPECIFIC GRAVITY, URINE: 1.013 (ref 1.005–1.030)

## 2018-04-10 MED ORDER — AMOXICILLIN-POT CLAVULANATE 875-125 MG PO TABS
1.0000 | ORAL_TABLET | Freq: Once | ORAL | Status: AC
  Administered 2018-04-10: 1 via ORAL
  Filled 2018-04-10: qty 1

## 2018-04-10 MED ORDER — AMOXICILLIN-POT CLAVULANATE 875-125 MG PO TABS: 1.0000 | ORAL_TABLET | Freq: Two times a day (BID) | ORAL | 0 refills | Status: DC

## 2018-04-10 NOTE — ED Provider Notes (Addendum)
[facility] EMERGENCY DEPARTMENT Provider Note   CSN: [id number] Arrival date & time: 04/09/18  2239    History   Chief Complaint Chief Complaint  Patient presents with  . Cough    HPI [name] is a 68 y.o. female.     The history is provided by the patient.  Cough  Cough characteristics:  Productive and non-productive Severity:  Mild Onset quality:  Gradual Duration:  1 day Timing:  Sporadic Progression:  Unchanged Chronicity:  New Context: sick contacts   Context: not animal exposure   Relieved by:  Nothing Worsened by:  Nothing Ineffective treatments:  None tried Associated symptoms: fever   Associated symptoms: no chest pain, no shortness of breath and no sinus congestion   Risk factors: no chemical exposure     Past Medical History:  Diagnosis Date  . Arthritis   . Breast cancer ([place]) 02/19/13  . Chronic back pain    reason unknown  . Constipation    doesn't take any medss  . Diverticulosis   . Headache(784.0)    occasionally  . History of bladder infections   . History of blood transfusion    no abnormal reaction noted  . History of colon polyps   . Hx of radiation therapy 05/13/13- 06/16/13   right breast 5000 cGy in 25 sessions  . Hypercholesteremia    takes Crestor daily  . Hypertension    takes HCTZ daily  . Hypothyroid    takes Synthroid daily  . Insomnia    takes Ambien nightly as needed  . Neck pain 05/13/2012   HNP  . Neuropathy   . Vertigo    doesn't take any meds    Patient Active Problem List   Diagnosis Date Noted  . Rectal bleeding 04/01/2018  . Lymphedema 07/22/2014  . S/P cervical spinal fusion 03/26/2014  . Sprain of neck 07/10/2013  . Stiffness of joint, not elsewhere classified, other specified site 07/10/2013  . Breast cancer ([place])   . 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 Breast cancer of upper-outer quadrant of right female breast ([place]) 02/24/2013  . Unspecified  constipation 01/22/2013  . Dyspepsia 01/22/2013  . Vertigo 05/13/2012  . Headache(784.0) 05/13/2012  . Neck pain 05/13/2012  . Menopausal symptoms   . Abscess 06/14/2011  . DYSPHAGIA UNSPECIFIED 09/14/2008  . MENOPAUSE-RELATED VASOMOTOR SYMPTOMS, HOT FLASHES 03/25/2007  . HYPERTHYROIDISM 10/22/2006  . HYPERLIPIDEMIA 10/22/2006  . HYPERTENSION 10/22/2006  . OVERACTIVE BLADDER 10/22/2006  . FIBROCYSTIC BREAST DISEASE 10/22/2006  . SWEATING 10/22/2006  . INSOMNIA, HX OF 10/22/2006    Past Surgical History:  Procedure Laterality Date  . ABDOMINAL HYSTERECTOMY  1983   partial  . ANTERIOR CERVICAL DECOMP/DISCECTOMY FUSION N/A 03/26/2014   Procedure: ANTERIOR CERVICAL DECOMPRESSION/DISCECTOMY FUSION 1 LEVEL;  Surgeon: Eustace Moore, MD;  Location: [facility];  Service: Neurosurgery;  Laterality: N/A;  ANTERIOR CERVICAL DECOMPRESSION/DISCECTOMY FUSION 1 LEVEL CERVICAL 5-6  . BREAST LUMPECTOMY Right 04/03/2013  . BREAST LUMPECTOMY WITH NEEDLE LOCALIZATION AND AXILLARY SENTINEL LYMPH NODE BX Right 04/03/2013   Procedure: BREAST LUMPECTOMY WITH NEEDLE LOCALIZATION AND AXILLARY SENTINEL LYMPH NODE BX;  Surgeon: Merrie Roof, MD;  Location: [facility];  Service: General;  Laterality: Right;  . CHOLECYSTECTOMY  1994  . COLONOSCOPY  09/17/2008   ZSW:FUXN tortuous, but otherwise normal-appearing colon/. Scattered diverticula  . ESOPHAGOGASTRODUODENOSCOPY  09/17/2008   RMR:Two mid esophageal diverticula/Small benign cystic mucosal lesions distal esophagus of doubtful  clinical  significance, stable for least 5 years/ Small hiatal hernia, otherwise normal stomach D1, D2  . ESOPHAGOGASTRODUODENOSCOPY    02/10/2003   KDT:OIZTIW esophageal 3 cystic lesions without luminal compromise or evidence/Nonerosive antral gastritis/The esophagus was dilated by passing 56 [place] Maloney dilator.Marland Kitchen Epic notes states +H.pylori gastritis. treatment completed per epic notes.   . ESOPHAGOGASTRODUODENOSCOPY (EGD) WITH  ESOPHAGEAL DILATION N/A 02/03/2013   PYK:DXIPJA esophageal duplication cyst. Small esophageal diverticulum. Otherwise;  EGD normal - Widely patent tubular esophagus before and after dilation.  Status post passage of  a  Maloney dilator.  . EUS  Aug 2011   Dr. Newman Pies: EGD with multiple lower esophageal submucosal nodules, stomach and duodenum normal, EUS with esophageal duplication cysts  . THYROID SURGERY  2011     OB History    Gravida  4   Para  3   Term      Preterm      AB      Living        SAB      TAB      Ectopic      Multiple      Live Births               Home Medications    Prior to Admission medications   Medication Sig Start Date End Date Taking? Authorizing Provider  gabapentin (NEURONTIN) 100 MG capsule Take 100 mg by mouth as needed (arm and shoulder pain).    Yes [provider]  hydrochlorothiazide (HYDRODIURIL) 25 MG tablet Take 25 mg by mouth daily.     Yes [provider]  levothyroxine (SYNTHROID, LEVOTHROID) 88 MCG tablet Take 75 mcg by mouth daily.    Yes [provider]  pantoprazole (PROTONIX) 40 MG tablet Take 1 tablet (40 mg total) by mouth daily. Patient taking differently: Take 40 mg by mouth daily as needed.  12/29/17  Yes Sherwood Gambler, MD  potassium chloride SA (K-DUR,KLOR-CON) 20 MEQ tablet Take 20 mEq by mouth daily.   Yes [provider]  rosuvastatin (CRESTOR) 5 MG tablet Take 2.5 mg by mouth daily.    Yes [provider]  zolpidem (AMBIEN) 5 MG tablet Take 5 mg by mouth at bedtime as needed for sleep.  01/21/14  Yes [provider]  polyethylene glycol-electrolytes (TRILYTE) 420 g solution Take 4,000 mLs by mouth as directed. Patient not taking: Reported on 04/10/2018 04/01/18   Rourk, Cristopher Estimable, MD    Family History Family History  Problem Relation Age of Onset  . Lupus Sister   . Hypertension Brother   . Diabetes Brother   . Hypertension Brother   . Diabetes Brother     . Multiple sclerosis Mother   . Cancer Father        "stomach"  . Breast cancer Daughter 26  . Colon cancer Neg Hx     Social History Social History   Tobacco Use  . Smoking status: Never Smoker  . Smokeless tobacco: Never Used  Substance Use Topics  . Alcohol use: No  . Drug use: No     Allergies   Codeine and Percocet [oxycodone-acetaminophen]   Review of Systems Review of Systems  Constitutional: Positive for fever.  Respiratory: Positive for cough. Negative for choking, shortness of breath and stridor.   Cardiovascular: Negative for chest pain, palpitations and leg swelling.  All other systems reviewed and are negative.    Physical Exam Updated Vital Signs BP 134/66 (BP Location: Left Arm)  Pulse 81   Temp 99.1 F (37.3 C) (Oral)   Resp 16   SpO2 98%   Physical Exam Vitals signs and nursing note reviewed.  Constitutional:      Appearance: Normal appearance.  HENT:     Head: Normocephalic and atraumatic.     Nose: Nose normal.     Mouth/Throat:     Mouth: Mucous membranes are moist.     Pharynx: Oropharynx is clear.  Eyes:     Conjunctiva/sclera: Conjunctivae normal.     Pupils: Pupils are equal, round, and reactive to light.  Neck:     Musculoskeletal: Normal range of motion and neck supple.  Cardiovascular:     Rate and Rhythm: Normal rate and regular rhythm.     Pulses: Normal pulses.     Heart sounds: Normal heart sounds.  Pulmonary:     Effort: Pulmonary effort is normal.     Breath sounds: No wheezing, rhonchi or rales.  Abdominal:     General: Abdomen is flat. Bowel sounds are normal.     Tenderness: There is no abdominal tenderness. There is no guarding.     Hernia: No hernia is present.  Musculoskeletal: Normal range of motion.        General: No tenderness.     Right lower leg: No edema.     Left lower leg: No edema.  Skin:    General: Skin is warm and dry.     Capillary Refill: Capillary refill takes less than 2 seconds.   Neurological:     General: No focal deficit present.     Mental Status: She is alert and oriented to person, place, and time.     Deep Tendon Reflexes: Reflexes normal.  Psychiatric:        Behavior: Behavior normal.      ED Treatments / Results  Labs (all labs ordered are listed, but only abnormal results are displayed) Results for orders placed or performed during the hospital encounter of 04/09/18  Comprehensive metabolic panel  Result Value Ref Range   Sodium 131 (L) 135 - 145 mmol/L   Potassium 3.4 (L) 3.5 - 5.1 mmol/L   Chloride 97 (L) 98 - 111 mmol/L   CO2 22 22 - 32 mmol/L   Glucose, Bld 112 (H) 70 - 99 mg/dL   BUN 9 8 - 23 mg/dL   Creatinine, Ser 0.89 0.44 - 1.00 mg/dL   Calcium 8.8 (L) 8.9 - 10.3 mg/dL   Total Protein 7.1 6.5 - 8.1 g/dL   Albumin 3.7 3.5 - 5.0 g/dL   AST 29 15 - 41 U/L   ALT 21 0 - 44 U/L   Alkaline Phosphatase 54 38 - 126 U/L   Total Bilirubin 0.5 0.3 - 1.2 mg/dL   GFR calc non Af Amer >60 >60 mL/min   GFR calc Af Amer >60 >60 mL/min   Anion gap 12 5 - 15  CBC with Differential  Result Value Ref Range   WBC 10.2 4.0 - 10.5 K/uL   RBC 4.23 3.87 - 5.11 MIL/uL   Hemoglobin 11.6 (L) 12.0 - 15.0 g/dL   HCT 37.6 36.0 - 46.0 %   MCV 88.9 80.0 - 100.0 fL   MCH 27.4 26.0 - 34.0 pg   MCHC 30.9 30.0 - 36.0 g/dL   RDW 12.7 11.5 - 15.5 %   Platelets 282 150 - 400 K/uL   nRBC 0.0 0.0 - 0.2 %   Neutrophils Relative % 81 %  Neutro Abs 8.3 (H) 1.7 - 7.7 K/uL   Lymphocytes Relative 12 %   Lymphs Abs 1.3 0.7 - 4.0 K/uL   Monocytes Relative 5 %   Monocytes Absolute 0.5 0.1 - 1.0 K/uL   Eosinophils Relative 1 %   Eosinophils Absolute 0.1 0.0 - 0.5 K/uL   Basophils Relative 0 %   Basophils Absolute 0.0 0.0 - 0.1 K/uL   Immature Granulocytes 1 %   Abs Immature Granulocytes 0.05 0.00 - 0.07 K/uL  Urinalysis, Routine w reflex microscopic  Result Value Ref Range   Color, Urine YELLOW YELLOW   APPearance CLEAR CLEAR   Specific Gravity, Urine 1.013  1.005 - 1.030   pH 6.0 5.0 - 8.0   Glucose, UA NEGATIVE NEGATIVE mg/dL   Hgb urine dipstick SMALL (A) NEGATIVE   Bilirubin Urine NEGATIVE NEGATIVE   Ketones, ur NEGATIVE NEGATIVE mg/dL   Protein, ur NEGATIVE NEGATIVE mg/dL   Nitrite NEGATIVE NEGATIVE   Leukocytes,Ua TRACE (A) NEGATIVE   RBC / HPF 0-5 0 - 5 RBC/hpf   WBC, UA 0-5 0 - 5 WBC/hpf   Bacteria, UA RARE (A) NONE SEEN   Squamous Epithelial / LPF 0-5 0 - 5   Dg Chest 2 View  Result Date: 04/09/2018 CLINICAL DATA:  Cough and fever EXAM: CHEST - 2 VIEW COMPARISON:  02/10/2016 FINDINGS: Streaky atelectasis or minimal infiltrate at the left base. No pleural effusion. Normal heart size. No pneumothorax. Surgical changes at the cervical spine and right breast and axilla. IMPRESSION: Mild streaky atelectasis or infiltrate at the left base. Electronically Signed   By: Donavan Foil M.D.   On: 04/09/2018 23:09   Mm Diag Breast Tomo Bilateral  Result Date: 03/25/2018 CLINICAL DATA:  69 year old female presenting for routine annual surveillance status post right breast lumpectomy in 2015. EXAM: DIGITAL DIAGNOSTIC BILATERAL MAMMOGRAM WITH CAD AND TOMO COMPARISON:  Previous exam(s). ACR Breast Density Category b: There are scattered areas of fibroglandular density. FINDINGS: The right breast lumpectomy site is stable. No suspicious calcifications, masses or areas of distortion are seen in the bilateral breasts. Mammographic images were processed with CAD. IMPRESSION: Stable right breast lumpectomy site. No mammographic evidence of malignancy in the bilateral breasts. RECOMMENDATION: Screening mammogram in one year.(Code:SM-B-01Y) I have discussed the findings and recommendations with the patient. Results were also provided in writing at the conclusion of the visit. If applicable, a reminder letter will be sent to the patient regarding the next appointment. BI-RADS CATEGORY  2: Benign. Electronically Signed   By: Ammie Ferrier M.D.   On: 03/25/2018  10:08    EKG None  Radiology Dg Chest 2 View  Result Date: 04/09/2018 CLINICAL DATA:  Cough and fever EXAM: CHEST - 2 VIEW COMPARISON:  02/10/2016 FINDINGS: Streaky atelectasis or minimal infiltrate at the left base. No pleural effusion. Normal heart size. No pneumothorax. Surgical changes at the cervical spine and right breast and axilla. IMPRESSION: Mild streaky atelectasis or infiltrate at the left base. Electronically Signed   By: Donavan Foil M.D.   On: 04/09/2018 23:09    Procedures Procedures (including critical care time)  Medications Ordered in ED Medications  sodium chloride flush (NS) 0.9 % injection 3 mL (has no administration in time range)  acetaminophen (TYLENOL) tablet 650 mg (650 mg Oral Given 04/09/18 2255)  amoxicillin-clavulanate (AUGMENTIN) 875-125 MG per tablet 1 tablet (1 tablet Oral Given 04/10/18 0352)     Early PNA on CXR curb 65 score compatible with discharge. No signs of sepsis.  Recheck with your doctor in 2 days.    Final Clinical Impressions(s) / ED Diagnoses   Return for  intractable cough, coughing up blood, fevers >100.4 unrelieved by medication, shortness of breath, intractable vomiting, chest pain, shortness of breath, weakness, numbness, changes in speech, facial asymmetry,abdominal pain, passing out,Inability to tolerate liquids or food, cough, altered mental status or any concerns. No signs of systemic illness or infection. The patient is nontoxic-appearing on exam and vital signs are within normal limits.   I have reviewed the triage vital signs and the nursing notes. Pertinent labs &imaging results that were available during my care of the patient were reviewed by me and considered in my medical decision making (see chart for details).  After history, exam, and medical workup I feel the patient has been appropriately medically screened and is safe for discharge home. Pertinent diagnoses were discussed with the patient. Patient was given  return precautions.      Darthy Manganelli, MD 04/10/18 1155    Veatrice Kells, MD 04/10/18 2080    Veatrice Kells, MD 04/10/18 2233

## 2018-05-12 ENCOUNTER — Other Ambulatory Visit: Payer: Self-pay

## 2018-05-12 ENCOUNTER — Emergency Department (HOSPITAL_COMMUNITY): Payer: Medicare Other

## 2018-05-12 ENCOUNTER — Emergency Department (HOSPITAL_COMMUNITY)
Admission: EM | Admit: 2018-05-12 | Discharge: 2018-05-12 | Disposition: A | Discharge status: Home / Self Care | Payer: Medicare Other | Attending: Emergency Medicine | Admitting: Emergency Medicine

## 2018-05-12 DIAGNOSIS — I10 Essential (primary) hypertension: Secondary | ICD-10-CM | POA: Diagnosis not present

## 2018-05-12 DIAGNOSIS — R1031 Right lower quadrant pain: Secondary | ICD-10-CM

## 2018-05-12 DIAGNOSIS — R1011 Right upper quadrant pain: Secondary | ICD-10-CM | POA: Diagnosis present

## 2018-05-12 DIAGNOSIS — Z79899 Other long term (current) drug therapy: Secondary | ICD-10-CM | POA: Insufficient documentation

## 2018-05-12 DIAGNOSIS — Z853 Personal history of malignant neoplasm of breast: Secondary | ICD-10-CM | POA: Insufficient documentation

## 2018-05-12 LAB — CBC WITH DIFFERENTIAL/PLATELET
Abs Immature Granulocytes: 0.04 10*3/uL (ref 0.00–0.07)
Basophils Absolute: 0 10*3/uL (ref 0.0–0.1)
Basophils Relative: 0 %
Eosinophils Absolute: 0.1 10*3/uL (ref 0.0–0.5)
Eosinophils Relative: 1 %
HCT: 37.5 % (ref 36.0–46.0)
Hemoglobin: 12.7 g/dL (ref 12.0–15.0)
Immature Granulocytes: 0 %
Lymphocytes Relative: 24 %
Lymphs Abs: 2.5 10*3/uL (ref 0.7–4.0)
MCH: 28.7 pg (ref 26.0–34.0)
MCHC: 33.9 g/dL (ref 30.0–36.0)
MCV: 84.7 fL (ref 80.0–100.0)
Monocytes Absolute: 0.7 10*3/uL (ref 0.1–1.0)
Monocytes Relative: 6 %
Neutro Abs: 7 10*3/uL (ref 1.7–7.7)
Neutrophils Relative %: 69 %
Platelets: 263 10*3/uL (ref 150–400)
RBC: 4.43 MIL/uL (ref 3.87–5.11)
RDW: 12.6 % (ref 11.5–15.5)
WBC: 10.3 10*3/uL (ref 4.0–10.5)
nRBC: 0 % (ref 0.0–0.2)

## 2018-05-12 LAB — COMPREHENSIVE METABOLIC PANEL
ALT: 21 U/L (ref 0–44)
AST: 26 U/L (ref 15–41)
Albumin: 3.4 g/dL — ABNORMAL LOW (ref 3.5–5.0)
Alkaline Phosphatase: 53 U/L (ref 38–126)
Anion gap: 9 (ref 5–15)
BUN: 8 mg/dL (ref 8–23)
CO2: 25 mmol/L (ref 22–32)
Calcium: 8.7 mg/dL — ABNORMAL LOW (ref 8.9–10.3)
Chloride: 93 mmol/L — ABNORMAL LOW (ref 98–111)
Creatinine, Ser: 0.98 mg/dL (ref 0.44–1.00)
GFR calc Af Amer: >60 mL/min (ref >60)
GFR calc non Af Amer: 58 mL/min — ABNORMAL LOW (ref >60)
Glucose, Bld: 119 mg/dL — ABNORMAL HIGH (ref 70–99)
Potassium: 2.9 mmol/L — ABNORMAL LOW (ref 3.5–5.1)
Sodium: 127 mmol/L — ABNORMAL LOW (ref 135–145)
Total Bilirubin: 0.8 mg/dL (ref 0.3–1.2)
Total Protein: 7.4 g/dL (ref 6.5–8.1)

## 2018-05-12 LAB — URINALYSIS, ROUTINE W REFLEX MICROSCOPIC
Bilirubin Urine: NEGATIVE
Glucose, UA: NEGATIVE mg/dL
Hgb urine dipstick: NEGATIVE
Ketones, ur: NEGATIVE mg/dL
Leukocytes,Ua: NEGATIVE
Nitrite: NEGATIVE
Protein, ur: NEGATIVE mg/dL
Specific Gravity, Urine: 1.008 (ref 1.005–1.030)
pH: 7 (ref 5.0–8.0)

## 2018-05-12 MED ORDER — KETOROLAC TROMETHAMINE 60 MG/2ML IM SOLN
60.0000 mg | Freq: Once | INTRAMUSCULAR | Status: AC
  Administered 2018-05-12: 20:00:00 60 mg via INTRAMUSCULAR
  Filled 2018-05-12: qty 2

## 2018-05-12 MED ORDER — TRAMADOL HCL 50 MG PO TABS: 50.0000 mg | ORAL_TABLET | Freq: Four times a day (QID) | ORAL | 0 refills | Status: DC | PRN

## 2018-05-12 NOTE — ED Provider Notes (Signed)
[facility] EMERGENCY DEPARTMENT Provider Note   CSN: [id number] Arrival date & time: 05/12/18  1832    History   Chief Complaint Chief Complaint  Patient presents with  . Back Pain  . Abdominal Pain  . Diarrhea    HPI [name] is a 68 y.o. female.     Patient is a 68 year old female with past medical history of diverticulosis, chronic back pain, breast cancer, hypertension.  She presents today for evaluation of pain in her right flank and right lower abdomen.  This started yesterday and is worsening.  She denies any bowel or bladder complaints.  She denies any fevers or chills.  The history is provided by the patient.  Flank Pain  This is a new problem. The current episode started yesterday. The problem occurs constantly. The problem has been gradually worsening. Nothing aggravates the symptoms. Nothing relieves the symptoms. She has tried nothing for the symptoms.    Past Medical History:  Diagnosis Date  . Arthritis   . Breast cancer ([place]) 02/19/13  . Chronic back pain    reason unknown  . Constipation    doesn't take any medss  . Diverticulosis   . Headache(784.0)    occasionally  . History of bladder infections   . History of blood transfusion    no abnormal reaction noted  . History of colon polyps   . Hx of radiation therapy 05/13/13- 06/16/13   right breast 5000 cGy in 25 sessions  . Hypercholesteremia    takes Crestor daily  . Hypertension    takes HCTZ daily  . Hypothyroid    takes Synthroid daily  . Insomnia    takes Ambien nightly as needed  . Neck pain 05/13/2012   HNP  . Neuropathy   . Vertigo    doesn't take any meds    Patient Active Problem List   Diagnosis Date Noted  . Rectal bleeding 04/01/2018  . Lymphedema 07/22/2014  . S/P cervical spinal fusion 03/26/2014  . Sprain of neck 07/10/2013  . Stiffness of joint, not elsewhere classified, other specified site 07/10/2013  . Breast cancer ([place])   . DCIS (ductal  carcinoma in situ) of breast 04/14/2013  . Breast cancer of upper-outer quadrant of right female breast ([place]) 02/24/2013  . Unspecified constipation 01/22/2013  . Dyspepsia 01/22/2013  . Vertigo 05/13/2012  . Headache(784.0) 05/13/2012  . Neck pain 05/13/2012  . Menopausal symptoms   . Abscess 06/14/2011  . DYSPHAGIA UNSPECIFIED 09/14/2008  . MENOPAUSE-RELATED VASOMOTOR SYMPTOMS, HOT FLASHES 03/25/2007  . HYPERTHYROIDISM 10/22/2006  . HYPERLIPIDEMIA 10/22/2006  . HYPERTENSION 10/22/2006  . OVERACTIVE BLADDER 10/22/2006  . FIBROCYSTIC BREAST DISEASE 10/22/2006  . SWEATING 10/22/2006  . INSOMNIA, HX OF 10/22/2006    Past Surgical History:  Procedure Laterality Date  . ABDOMINAL HYSTERECTOMY  1983   partial  . ANTERIOR CERVICAL DECOMP/DISCECTOMY FUSION N/A 03/26/2014   Procedure: ANTERIOR CERVICAL DECOMPRESSION/DISCECTOMY FUSION 1 LEVEL;  Surgeon: Eustace Moore, MD;  Location: [facility];  Service: Neurosurgery;  Laterality: N/A;  ANTERIOR CERVICAL DECOMPRESSION/DISCECTOMY FUSION 1 LEVEL CERVICAL 5-6  . BREAST LUMPECTOMY Right 04/03/2013  . BREAST LUMPECTOMY WITH NEEDLE LOCALIZATION AND AXILLARY SENTINEL LYMPH NODE BX Right 04/03/2013   Procedure: BREAST LUMPECTOMY WITH NEEDLE LOCALIZATION AND AXILLARY SENTINEL LYMPH NODE BX;  Surgeon: Merrie Roof, MD;  Location: [facility];  Service: General;  Laterality: Right;  . CHOLECYSTECTOMY  1994  . COLONOSCOPY  09/17/2008   VEL:FYBO tortuous, but otherwise normal-appearing  colon/. Scattered diverticula  . ESOPHAGOGASTRODUODENOSCOPY  09/17/2008   RMR:Two mid esophageal diverticula/Small benign cystic mucosal lesions distal esophagus of doubtful  clinical significance, stable for least 5 years/ Small hiatal hernia, otherwise normal stomach D1, D2  . ESOPHAGOGASTRODUODENOSCOPY    02/10/2003   FIE:PPIRJJ esophageal 3 cystic lesions without luminal compromise or evidence/Nonerosive antral gastritis/The esophagus was dilated by passing 56 [place]  Maloney dilator.Marland Kitchen Epic notes states +H.pylori gastritis. treatment completed per epic notes.   . ESOPHAGOGASTRODUODENOSCOPY (EGD) WITH ESOPHAGEAL DILATION N/A 02/03/2013   OAC:ZYSAYT esophageal duplication cyst. Small esophageal diverticulum. Otherwise;  EGD normal - Widely patent tubular esophagus before and after dilation.  Status post passage of  a  Maloney dilator.  . EUS  Aug 2011   Dr. Newman Pies: EGD with multiple lower esophageal submucosal nodules, stomach and duodenum normal, EUS with esophageal duplication cysts  . THYROID SURGERY  2011     OB History    Gravida  4   Para  3   Term      Preterm      AB      Living        SAB      TAB      Ectopic      Multiple      Live Births               Home Medications    Prior to Admission medications   Medication Sig Start Date End Date Taking? Authorizing Provider  amoxicillin-clavulanate (AUGMENTIN) 875-125 MG tablet Take 1 tablet by mouth 2 (two) times daily. One po bid x 7 days 04/10/18   Palumbo, April, MD  gabapentin (NEURONTIN) 100 MG capsule Take 100 mg by mouth as needed (arm and shoulder pain).     [provider]  hydrochlorothiazide (HYDRODIURIL) 25 MG tablet Take 25 mg by mouth daily.      [provider]  levothyroxine (SYNTHROID, LEVOTHROID) 88 MCG tablet Take 75 mcg by mouth daily.     [provider]  pantoprazole (PROTONIX) 40 MG tablet Take 1 tablet (40 mg total) by mouth daily. Patient taking differently: Take 40 mg by mouth daily as needed.  12/29/17   Sherwood Gambler, MD  polyethylene glycol-electrolytes (TRILYTE) 420 g solution Take 4,000 mLs by mouth as directed. Patient not taking: Reported on 04/10/2018 04/01/18   Daneil Dolin, MD  potassium chloride SA (K-DUR,KLOR-CON) 20 MEQ tablet Take 20 mEq by mouth daily.    [provider]  rosuvastatin (CRESTOR) 5 MG tablet Take 2.5 mg by mouth daily.     [provider]  zolpidem (AMBIEN) 5 MG tablet Take 5  mg by mouth at bedtime as needed for sleep.  01/21/14   [provider]    Family History Family History  Problem Relation Age of Onset  . Lupus Sister   . Hypertension Brother   . Diabetes Brother   . Hypertension Brother   . Diabetes Brother   . Multiple sclerosis Mother   . Cancer Father        "stomach"  . Breast cancer Daughter 71  . Colon cancer Neg Hx     Social History Social History   Tobacco Use  . Smoking status: Never Smoker  . Smokeless tobacco: Never Used  Substance Use Topics  . Alcohol use: No  . Drug use: No     Allergies   Codeine and Percocet [oxycodone-acetaminophen]   Review of Systems Review of Systems  Genitourinary:  Positive for flank pain.  All other systems reviewed and are negative.    Physical Exam Updated Vital Signs BP (!) 146/78   Pulse 74   Temp 99.1 F (37.3 C) (Oral)   Resp 18   Ht 5\' 4"  (1.626 m)   Wt 78 kg   SpO2 98%   BMI 29.52 kg/m   Physical Exam Vitals signs and nursing note reviewed.  Constitutional:      General: She is not in acute distress.    Appearance: She is well-developed. She is not diaphoretic.  HENT:     Head: Normocephalic and atraumatic.  Neck:     Musculoskeletal: Normal range of motion and neck supple.  Cardiovascular:     Rate and Rhythm: Normal rate and regular rhythm.     Heart sounds: No murmur. No friction rub. No gallop.   Pulmonary:     Effort: Pulmonary effort is normal. No respiratory distress.     Breath sounds: Normal breath sounds. No wheezing.  Abdominal:     General: Bowel sounds are normal. There is no distension.     Palpations: Abdomen is soft.     Tenderness: There is abdominal tenderness in the right lower quadrant. There is right CVA tenderness. There is no guarding or rebound.  Musculoskeletal: Normal range of motion.  Skin:    General: Skin is warm and dry.  Neurological:     Mental Status: She is alert and oriented to person, place, and time.       ED Treatments / Results  Labs (all labs ordered are listed, but only abnormal results are displayed) Labs Reviewed  COMPREHENSIVE METABOLIC PANEL  CBC WITH DIFFERENTIAL/PLATELET  URINALYSIS, ROUTINE W REFLEX MICROSCOPIC    EKG None  Radiology No results found.  Procedures Procedures (including critical care time)  Medications Ordered in ED Medications  ketorolac (TORADOL) injection 60 mg (has no administration in time range)     Initial Impression / Assessment and Plan / ED Course  I have reviewed the triage vital signs and the nursing notes.  Pertinent labs & imaging results that were available during my care of the patient were reviewed by me and considered in my medical decision making (see chart for details).  Patient presents here with complaints of pain in her right flank and right lower abdomen, the etiology of which I am uncertain.  Nothing has shown up that looks emergent on her CT scan and urine is clear.  Her laboratory studies are reassuring.  Patient seems to be feeling better after medications received here in the ER.  She will be discharged with tramadol and is to follow-up with primary doctor if not improving.  Final Clinical Impressions(s) / ED Diagnoses   Final diagnoses:  None    ED Discharge Orders    None       Veryl Speak, MD 05/12/18 2219

## 2018-05-12 NOTE — ED Triage Notes (Signed)
Pt here for evaluation of lower back pain, lower abdominal pain, and diarrhea x 2 since yesterday. Recent dx of diverticulosis.

## 2018-05-12 NOTE — Discharge Instructions (Addendum)
Tramadol as prescribed as needed for pain.  Follow-up with your primary doctor if symptoms or not improving in the next week, and return to the ER if you develop worsening pain, high fever, bloody stool, or other new and concerning symptoms.

## 2018-05-12 NOTE — ED Notes (Addendum)
Please note: Dark green top collected in addition to CBC and CMP. Located on rocker in [employer].

## 2018-05-21 ENCOUNTER — Other Ambulatory Visit: Payer: Self-pay

## 2018-05-21 ENCOUNTER — Telehealth: Payer: Self-pay | Admitting: Internal Medicine

## 2018-05-21 DIAGNOSIS — K625 Hemorrhage of anus and rectum: Secondary | ICD-10-CM

## 2018-05-21 DIAGNOSIS — R1310 Dysphagia, unspecified: Secondary | ICD-10-CM

## 2018-05-21 NOTE — Telephone Encounter (Signed)
Called and informed pt she is still scheduled for TCS w/RMR 05/31/18 at this time. If procedure can't be done we will contact her.  [name], pt told Dr. Willey Blade she is having trouble swallowing food now. Feels like food gets stuck. Dr. Willey Blade wanted her to ask if she can have esophageal dilation at time of TCS. Please advise.

## 2018-05-21 NOTE — Telephone Encounter (Signed)
Pt called to see if she was still scheduled with RMR on 5/1 for her colonoscopy. Please advise. [phone] or [phone]

## 2018-05-21 NOTE — Telephone Encounter (Signed)
Called and informed pt EGD/DIL will be added to TCS for 05/31/18. Informed endo scheduler. New orders entered.

## 2018-05-21 NOTE — Telephone Encounter (Signed)
Ok to add on EGD/ED for dysphagia. She also has h/o benign esophageal duplicating cyst.

## 2018-05-29 ENCOUNTER — Telehealth: Payer: Self-pay | Admitting: *Deleted

## 2018-05-29 NOTE — Telephone Encounter (Signed)
[name] in endo called and wants [place] to move patient procedure time to 8:45am on Friday 5/1.   I called patient and she was agreeable. She is aware to arrive at 7:45am. She will drink second half of prep on 5/1 at 3:45am and NPO after 5:45am. She voiced understanding. Endo is aware of appt change

## 2018-05-30 MED ORDER — SODIUM CHLORIDE 0.9 % IV SOLN: INTRAVENOUS | Status: DC

## 2018-05-31 ENCOUNTER — Ambulatory Visit (HOSPITAL_COMMUNITY)
Admission: RE | Admit: 2018-05-31 | Discharge: 2018-05-31 | Disposition: A | Discharge status: Home / Self Care | Payer: Medicare Other | Attending: Internal Medicine | Admitting: Internal Medicine

## 2018-05-31 ENCOUNTER — Other Ambulatory Visit: Payer: Self-pay

## 2018-05-31 ENCOUNTER — Ambulatory Visit (HOSPITAL_COMMUNITY): Admission: RE | Admit: 2018-05-31 | Payer: Medicare Other | Source: Home / Self Care | Admitting: Internal Medicine

## 2018-05-31 ENCOUNTER — Encounter (HOSPITAL_COMMUNITY): Admission: RE | Payer: Self-pay | Source: Home / Self Care

## 2018-05-31 ENCOUNTER — Encounter (HOSPITAL_COMMUNITY): Payer: Self-pay | Admitting: *Deleted

## 2018-05-31 ENCOUNTER — Encounter (HOSPITAL_COMMUNITY)
Admission: RE | Disposition: A | Discharge status: Home / Self Care | Payer: Self-pay | Source: Home / Self Care | Attending: Internal Medicine

## 2018-05-31 DIAGNOSIS — K5730 Diverticulosis of large intestine without perforation or abscess without bleeding: Secondary | ICD-10-CM | POA: Insufficient documentation

## 2018-05-31 DIAGNOSIS — I10 Essential (primary) hypertension: Secondary | ICD-10-CM | POA: Insufficient documentation

## 2018-05-31 DIAGNOSIS — E7800 Pure hypercholesterolemia, unspecified: Secondary | ICD-10-CM | POA: Diagnosis not present

## 2018-05-31 DIAGNOSIS — G4700 Insomnia, unspecified: Secondary | ICD-10-CM | POA: Diagnosis not present

## 2018-05-31 DIAGNOSIS — Z79899 Other long term (current) drug therapy: Secondary | ICD-10-CM | POA: Insufficient documentation

## 2018-05-31 DIAGNOSIS — K921 Melena: Secondary | ICD-10-CM | POA: Insufficient documentation

## 2018-05-31 DIAGNOSIS — D122 Benign neoplasm of ascending colon: Secondary | ICD-10-CM | POA: Diagnosis not present

## 2018-05-31 DIAGNOSIS — Q396 Congenital diverticulum of esophagus: Secondary | ICD-10-CM | POA: Diagnosis not present

## 2018-05-31 DIAGNOSIS — K640 First degree hemorrhoids: Secondary | ICD-10-CM | POA: Diagnosis not present

## 2018-05-31 DIAGNOSIS — R1310 Dysphagia, unspecified: Secondary | ICD-10-CM | POA: Diagnosis not present

## 2018-05-31 DIAGNOSIS — K5710 Diverticulosis of small intestine without perforation or abscess without bleeding: Secondary | ICD-10-CM | POA: Diagnosis not present

## 2018-05-31 DIAGNOSIS — E039 Hypothyroidism, unspecified: Secondary | ICD-10-CM | POA: Diagnosis not present

## 2018-05-31 DIAGNOSIS — K625 Hemorrhage of anus and rectum: Secondary | ICD-10-CM

## 2018-05-31 DIAGNOSIS — Z79890 Hormone replacement therapy (postmenopausal): Secondary | ICD-10-CM | POA: Diagnosis not present

## 2018-05-31 DIAGNOSIS — R1314 Dysphagia, pharyngoesophageal phase: Secondary | ICD-10-CM | POA: Insufficient documentation

## 2018-05-31 DIAGNOSIS — Z885 Allergy status to narcotic agent status: Secondary | ICD-10-CM | POA: Insufficient documentation

## 2018-05-31 DIAGNOSIS — Z853 Personal history of malignant neoplasm of breast: Secondary | ICD-10-CM | POA: Diagnosis not present

## 2018-05-31 DIAGNOSIS — Z86010 Personal history of colonic polyps: Secondary | ICD-10-CM | POA: Diagnosis not present

## 2018-05-31 DIAGNOSIS — G629 Polyneuropathy, unspecified: Secondary | ICD-10-CM | POA: Diagnosis not present

## 2018-05-31 HISTORY — PX: COLONOSCOPY: SHX5424

## 2018-05-31 HISTORY — PX: POLYPECTOMY: SHX5525

## 2018-05-31 HISTORY — PX: MALONEY DILATION: SHX5535

## 2018-05-31 HISTORY — PX: ESOPHAGOGASTRODUODENOSCOPY: SHX5428

## 2018-05-31 SURGERY — COLONOSCOPY
Anesthesia: Moderate Sedation

## 2018-05-31 MED ORDER — SODIUM CHLORIDE 0.9 % IV SOLN
INTRAVENOUS | Status: DC
  Administered 2018-05-31: 08:00:00 via INTRAVENOUS

## 2018-05-31 MED ORDER — ONDANSETRON HCL 4 MG/2ML IJ SOLN
INTRAMUSCULAR | Status: DC | PRN
  Administered 2018-05-31: 4 mg via INTRAVENOUS

## 2018-05-31 MED ORDER — STERILE WATER FOR IRRIGATION IR SOLN
Status: DC | PRN
  Administered 2018-05-31: 08:00:00

## 2018-05-31 MED ORDER — LIDOCAINE VISCOUS HCL 2 % MT SOLN
OROMUCOSAL | Status: DC | PRN
  Administered 2018-05-31: 4 mL via OROMUCOSAL

## 2018-05-31 MED ORDER — LIDOCAINE VISCOUS HCL 2 % MT SOLN
OROMUCOSAL | Status: AC
  Filled 2018-05-31: qty 15

## 2018-05-31 MED ORDER — MIDAZOLAM HCL 5 MG/5ML IJ SOLN
INTRAMUSCULAR | Status: AC
  Filled 2018-05-31: qty 10

## 2018-05-31 MED ORDER — ONDANSETRON HCL 4 MG/2ML IJ SOLN
INTRAMUSCULAR | Status: AC
  Filled 2018-05-31: qty 2

## 2018-05-31 MED ORDER — MEPERIDINE HCL 100 MG/ML IJ SOLN
INTRAMUSCULAR | Status: DC | PRN
  Administered 2018-05-31: 10 mg via INTRAVENOUS
  Administered 2018-05-31: 25 mg via INTRAVENOUS
  Administered 2018-05-31: 15 mg via INTRAVENOUS

## 2018-05-31 MED ORDER — MIDAZOLAM HCL 5 MG/5ML IJ SOLN
INTRAMUSCULAR | Status: DC | PRN
  Administered 2018-05-31 (×3): 1 mg via INTRAVENOUS
  Administered 2018-05-31 (×2): 2 mg via INTRAVENOUS
  Administered 2018-05-31 (×3): 1 mg via INTRAVENOUS

## 2018-05-31 MED ORDER — MIDAZOLAM HCL 5 MG/5ML IJ SOLN
INTRAMUSCULAR | Status: AC
  Filled 2018-05-31: qty 5

## 2018-05-31 MED ORDER — MEPERIDINE HCL 50 MG/ML IJ SOLN
INTRAMUSCULAR | Status: AC
  Filled 2018-05-31: qty 1

## 2018-05-31 NOTE — Discharge Instructions (Signed)
Colonoscopy Discharge Instructions  Read the instructions outlined below and refer to this sheet in the next few weeks. These discharge instructions provide you with general information on caring for yourself after you leave the hospital. Your doctor may also give you specific instructions. While your treatment has been planned according to the most current medical practices available, unavoidable complications occasionally occur. If you have any problems or questions after discharge, call Dr. Gala Romney at [phone]. ACTIVITY  You may resume your regular activity, but move at a slower pace for the next 24 hours.   Take frequent rest periods for the next 24 hours.   Walking will help get rid of the air and reduce the bloated feeling in your belly (abdomen).   No driving for 24 hours (because of the medicine (anesthesia) used during the test).    Do not sign any important legal documents or operate any machinery for 24 hours (because of the anesthesia used during the test).  NUTRITION  Drink plenty of fluids.   You may resume your normal diet as instructed by your doctor.   Begin with a light meal and progress to your normal diet. Heavy or fried foods are harder to digest and may make you feel sick to your stomach (nauseated).   Avoid alcoholic beverages for 24 hours or as instructed.  MEDICATIONS  You may resume your normal medications unless your doctor tells you otherwise.  WHAT YOU CAN EXPECT TODAY  Some feelings of bloating in the abdomen.   Passage of more gas than usual.   Spotting of blood in your stool or on the toilet paper.  IF YOU HAD POLYPS REMOVED DURING THE COLONOSCOPY:  No aspirin products for 7 days or as instructed.   No alcohol for 7 days or as instructed.   Eat a soft diet for the next 24 hours.  FINDING OUT THE RESULTS OF YOUR TEST Not all test results are available during your visit. If your test results are not back during the visit, make an appointment  with your caregiver to find out the results. Do not assume everything is normal if you have not heard from your caregiver or the medical facility. It is important for you to follow up on all of your test results.  SEEK IMMEDIATE MEDICAL ATTENTION IF:  You have more than a spotting of blood in your stool.   Your belly is swollen (abdominal distention).   You are nauseated or vomiting.   You have a temperature over 101.   You have abdominal pain or discomfort that is severe or gets worse throughout the day.    EGD Discharge instructions Please read the instructions outlined below and refer to this sheet in the next few weeks. These discharge instructions provide you with general information on caring for yourself after you leave the hospital. Your doctor may also give you specific instructions. While your treatment has been planned according to the most current medical practices available, unavoidable complications occasionally occur. If you have any problems or questions after discharge, please call your doctor. ACTIVITY  You may resume your regular activity but move at a slower pace for the next 24 hours.   Take frequent rest periods for the next 24 hours.   Walking will help expel (get rid of) the air and reduce the bloated feeling in your abdomen.   No driving for 24 hours (because of the anesthesia (medicine) used during the test).   You may shower.   Do not sign  any important legal documents or operate any machinery for 24 hours (because of the anesthesia used during the test).  NUTRITION  Drink plenty of fluids.   You may resume your normal diet.   Begin with a light meal and progress to your normal diet.   Avoid alcoholic beverages for 24 hours or as instructed by your caregiver.  MEDICATIONS  You may resume your normal medications unless your caregiver tells you otherwise.  WHAT YOU CAN EXPECT TODAY  You may experience abdominal discomfort such as a feeling of  fullness or gas pains.  FOLLOW-UP  Your doctor will discuss the results of your test with you.  SEEK IMMEDIATE MEDICAL ATTENTION IF ANY OF THE FOLLOWING OCCUR:  Excessive nausea (feeling sick to your stomach) and/or vomiting.   Severe abdominal pain and distention (swelling).   Trouble swallowing.   Temperature over 101 F (37.8 C).   Rectal bleeding or vomiting of blood.    GERD information provided  Colon polyp, diverticulosis and hemorrhoid information provided  Begin Benefiber 1 tablespoon daily for 3 weeks; then increase to 1 tablespoon twice daily thereafter  Anusol HC cream apply pea-sized amount to the anorectum 4 times daily as needed  Continue Protonix 40 mg daily  Further recommendations to follow pending review of pathology report  Office visit with [place] in 3 months- PLEASE CALL [phone] TO SCHEDULE AN APPOINTMENT.   PATIENT INSTRUCTIONS POST-ANESTHESIA  IMMEDIATELY FOLLOWING SURGERY:  Do not drive or operate machinery for the first twenty four hours after surgery.  Do not make any important decisions for twenty four hours after surgery or while taking narcotic pain medications or sedatives.  If you develop intractable nausea and vomiting or a severe headache please notify your doctor immediately.  FOLLOW-UP:  Please make an appointment with your surgeon as instructed. You do not need to follow up with anesthesia unless specifically instructed to do so.  WOUND CARE INSTRUCTIONS (if applicable):  Keep a dry clean dressing on the anesthesia/puncture wound site if there is drainage.  Once the wound has quit draining you may leave it open to air.  Generally you should leave the bandage intact for twenty four hours unless there is drainage.  If the epidural site drains for more than 36-48 hours please call the anesthesia department.  QUESTIONS?:  Please feel free to call your physician or the hospital operator if you have any questions, and they will be happy to  assist you.      Gastroesophageal Reflux Disease, Adult Gastroesophageal reflux (GER) happens when acid from the stomach flows up into the tube that connects the mouth and the stomach (esophagus). Normally, food travels down the esophagus and stays in the stomach to be digested. With GER, food and stomach acid sometimes move back up into the esophagus. You may have a disease called gastroesophageal reflux disease (GERD) if the reflux:  Happens often.  Causes frequent or very bad symptoms.  Causes problems such as damage to the esophagus. When this happens, the esophagus becomes sore and swollen (inflamed). Over time, GERD can make small holes (ulcers) in the lining of the esophagus. What are the causes? This condition is caused by a problem with the muscle between the esophagus and the stomach. When this muscle is weak or not normal, it does not close properly to keep food and acid from coming back up from the stomach. The muscle can be weak because of:  Tobacco use.  Pregnancy.  Having a certain type of hernia (  hiatal hernia).  Alcohol use.  Certain foods and drinks, such as coffee, chocolate, onions, and peppermint. What increases the risk? You are more likely to develop this condition if you:  Are overweight.  Have a disease that affects your connective tissue.  Use NSAID medicines. What are the signs or symptoms? Symptoms of this condition include:  Heartburn.  Difficult or painful swallowing.  The feeling of having a lump in the throat.  A bitter taste in the mouth.  Bad breath.  Having a lot of saliva.  Having an upset or bloated stomach.  Belching.  Chest pain. Different conditions can cause chest pain. Make sure you see your doctor if you have chest pain.  Shortness of breath or noisy breathing (wheezing).  Ongoing (chronic) cough or a cough at night.  Wearing away of the surface of teeth (tooth enamel).  Weight loss. How is this treated? Treatment  will depend on how bad your symptoms are. Your doctor may suggest:  Changes to your diet.  Medicine.  Surgery. Follow these instructions at home: Eating and drinking   Follow a diet as told by your doctor. You may need to avoid foods and drinks such as: ? Coffee and tea (with or without caffeine). ? Drinks that contain alcohol. ? Energy drinks and sports drinks. ? Bubbly (carbonated) drinks or sodas. ? Chocolate and cocoa. ? Peppermint and mint flavorings. ? Garlic and onions. ? Horseradish. ? Spicy and acidic foods. These include peppers, chili powder, curry powder, vinegar, hot sauces, and BBQ sauce. ? Citrus fruit juices and citrus fruits, such as oranges, lemons, and limes. ? Tomato-based foods. These include red sauce, chili, salsa, and pizza with red sauce. ? Fried and fatty foods. These include donuts, french fries, potato chips, and high-fat dressings. ? High-fat meats. These include hot dogs, rib eye steak, sausage, ham, and bacon. ? High-fat dairy items, such as whole milk, butter, and cream cheese.  Eat small meals often. Avoid eating large meals.  Avoid drinking large amounts of liquid with your meals.  Avoid eating meals during the 2-3 hours before bedtime.  Avoid lying down right after you eat.  Do not exercise right after you eat. Lifestyle   Do not use any products that contain nicotine or tobacco. These include cigarettes, e-cigarettes, and chewing tobacco. If you need help quitting, ask your doctor.  Try to lower your stress. If you need help doing this, ask your doctor.  If you are overweight, lose an amount of weight that is healthy for you. Ask your doctor about a safe weight loss goal. General instructions  Pay attention to any changes in your symptoms.  Take over-the-counter and prescription medicines only as told by your doctor. Do not take aspirin, ibuprofen, or other NSAIDs unless your doctor says it is okay.  Wear loose clothes. Do not wear  anything tight around your waist.  Raise (elevate) the head of your bed about 6 inches (15 cm).  Avoid bending over if this makes your symptoms worse.  Keep all follow-up visits as told by your doctor. This is important. Contact a doctor if:  You have new symptoms.  You lose weight and you do not know why.  You have trouble swallowing or it hurts to swallow.  You have wheezing or a cough that keeps happening.  Your symptoms do not get better with treatment.  You have a hoarse voice. Get help right away if:  You have pain in your arms, neck, jaw, teeth, or  back.  You feel sweaty, dizzy, or light-headed.  You have chest pain or shortness of breath.  You throw up (vomit) and your throw-up looks like blood or coffee grounds.  You pass out (faint).  Your poop (stool) is bloody or black.  You cannot swallow, drink, or eat. Summary  If a person has gastroesophageal reflux disease (GERD), food and stomach acid move back up into the esophagus and cause symptoms or problems such as damage to the esophagus.  Treatment will depend on how bad your symptoms are.  Follow a diet as told by your doctor.  Take all medicines only as told by your doctor. This information is not intended to replace advice given to you by your health care provider. Make sure you discuss any questions you have with your health care provider. Document Released: 07/05/2007 Document Revised: 07/25/2017 Document Reviewed: 07/25/2017 Elsevier Interactive Patient Education  2019 [employer].    Colon Polyps  Polyps are tissue growths inside the body. Polyps can grow in many places, including the large intestine (colon). A polyp may be a round bump or a mushroom-shaped growth. You could have one polyp or several. Most colon polyps are noncancerous (benign). However, some colon polyps can become cancerous over time. Finding and removing the polyps early can help prevent this. What are the causes? The exact  cause of colon polyps is not known. What increases the risk? You are more likely to develop this condition if you: Have a family history of colon cancer or colon polyps. Are older than 46 or older than 45 if you are African American. Have inflammatory bowel disease, such as ulcerative colitis or Crohn's disease. Have certain hereditary conditions, such as: Familial adenomatous polyposis. Lynch syndrome. Turcot syndrome. Peutz-Jeghers syndrome. Are overweight. Smoke cigarettes. Do not get enough exercise. Drink too much alcohol. Eat a diet that is high in fat and red meat and low in fiber. Had childhood cancer that was treated with abdominal radiation. What are the signs or symptoms? Most polyps do not cause symptoms. If you have symptoms, they may include: Blood coming from your rectum when having a bowel movement. Blood in your stool. The stool may look dark red or black. Abdominal pain. A change in bowel habits, such as constipation or diarrhea. How is this diagnosed? This condition is diagnosed with a colonoscopy. This is a procedure in which a lighted, flexible scope is inserted into the anus and then passed into the colon to examine the area. Polyps are sometimes found when a colonoscopy is done as part of routine cancer screening tests. How is this treated? Treatment for this condition involves removing any polyps that are found. Most polyps can be removed during a colonoscopy. Those polyps will then be tested for cancer. Additional treatment may be needed depending on the results of testing. Follow these instructions at home: Lifestyle Maintain a healthy weight, or lose weight if recommended by your health care provider. Exercise every day or as told by your health care provider. Do not use any products that contain nicotine or tobacco, such as cigarettes and e-cigarettes. If you need help quitting, ask your health care provider. If you drink alcohol, limit how much you  have: 0-1 drink a day for women. 0-2 drinks a day for men. Be aware of how much alcohol is in your drink. In the [place] one drink equals one 12 oz bottle of beer (355 mL), one 5 oz glass of wine (148 mL), or one 1 oz shot  of hard liquor (44 mL). Eating and drinking  Eat foods that are high in fiber, such as fruits, vegetables, and whole grains. Eat foods that are high in calcium and vitamin D, such as milk, cheese, yogurt, eggs, liver, fish, and broccoli. Limit foods that are high in fat, such as fried foods and desserts. Limit the amount of red meat and processed meat you eat, such as hot dogs, sausage, bacon, and lunch meats. General instructions Keep all follow-up visits as told by your health care provider. This is important. This includes having regularly scheduled colonoscopies. Talk to your health care provider about when you need a colonoscopy. Contact a health care provider if: You have new or worsening bleeding during a bowel movement. You have new or increased blood in your stool. You have a change in bowel habits. You lose weight for no known reason. Summary Polyps are tissue growths inside the body. Polyps can grow in many places, including the colon. Most colon polyps are noncancerous (benign), but some can become cancerous over time. This condition is diagnosed with a colonoscopy. Treatment for this condition involves removing any polyps that are found. Most polyps can be removed during a colonoscopy. This information is not intended to replace advice given to you by your health care provider. Make sure you discuss any questions you have with your health care provider. Document Released: 10/13/2003 Document Revised: 05/03/2017 Document Reviewed: 05/03/2017 Elsevier Interactive Patient Education  2019 [employer].    Diverticulosis  Diverticulosis is a condition that develops when small pouches (diverticula) form in the wall of the large intestine (colon). The colon is  where water is absorbed and stool is formed. The pouches form when the inside layer of the colon pushes through weak spots in the outer layers of the colon. You may have a few pouches or many of them. What are the causes? The cause of this condition is not known. What increases the risk? The following factors may make you more likely to develop this condition:  Being older than age 67. Your risk for this condition increases with age. Diverticulosis is rare among people younger than age 45. By age 8, many people have it.  Eating a low-fiber diet.  Having frequent constipation.  Being overweight.  Not getting enough exercise.  Smoking.  Taking over-the-counter pain medicines, like aspirin and ibuprofen.  Having a family history of diverticulosis. What are the signs or symptoms? In most people, there are no symptoms of this condition. If you do have symptoms, they may include:  Bloating.  Cramps in the abdomen.  Constipation or diarrhea.  Pain in the lower left side of the abdomen. How is this diagnosed? This condition is most often diagnosed during an exam for other colon problems. Because diverticulosis usually has no symptoms, it often cannot be diagnosed independently. This condition may be diagnosed by:  Using a flexible scope to examine the colon (colonoscopy).  Taking an X-ray of the colon after dye has been put into the colon (barium enema).  Doing a CT scan. How is this treated? You may not need treatment for this condition if you have never developed an infection related to diverticulosis. If you have had an infection before, treatment may include:  Eating a high-fiber diet. This may include eating more fruits, vegetables, and grains.  Taking a fiber supplement.  Taking a live bacteria supplement (probiotic).  Taking medicine to relax your colon.  Taking antibiotic medicines. Follow these instructions at home:  Drink 6-8  glasses of water or more each day  to prevent constipation.  Try not to strain when you have a bowel movement.  If you have had an infection before: ? Eat more fiber as directed by your health care provider or your diet and nutrition specialist (dietitian). ? Take a fiber supplement or probiotic, if your health care provider approves.  Take over-the-counter and prescription medicines only as told by your health care provider.  If you were prescribed an antibiotic, take it as told by your health care provider. Do not stop taking the antibiotic even if you start to feel better.  Keep all follow-up visits as told by your health care provider. This is important. Contact a health care provider if:  You have pain in your abdomen.  You have bloating.  You have cramps.  You have not had a bowel movement in 3 days. Get help right away if:  Your pain gets worse.  Your bloating becomes very bad.  You have a fever or chills, and your symptoms suddenly get worse.  You vomit.  You have bowel movements that are bloody or black.  You have bleeding from your rectum. Summary  Diverticulosis is a condition that develops when small pouches (diverticula) form in the wall of the large intestine (colon).  You may have a few pouches or many of them.  This condition is most often diagnosed during an exam for other colon problems.  If you have had an infection related to diverticulosis, treatment may include increasing the fiber in your diet, taking supplements, or taking medicines. This information is not intended to replace advice given to you by your health care provider. Make sure you discuss any questions you have with your health care provider. Document Released: 10/14/2003 Document Revised: 12/06/2015 Document Reviewed: 12/06/2015 Elsevier Interactive Patient Education  2019 [employer]   Hemorrhoids Hemorrhoids are swollen veins that may develop:  In the butt (rectum). These are called internal  hemorrhoids.  Around the opening of the butt (anus). These are called external hemorrhoids. Hemorrhoids can cause pain, itching, or bleeding. Most of the time, they do not cause serious problems. They usually get better with diet changes, lifestyle changes, and other home treatments. What are the causes? This condition may be caused by:  Having trouble pooping (constipation).  Pushing hard (straining) to poop.  Watery poop (diarrhea).  Pregnancy.  Being very overweight (obese).  Sitting for long periods of time.  Heavy lifting or other activity that causes you to strain.  Anal sex.  Riding a bike for a long period of time. What are the signs or symptoms? Symptoms of this condition include:  Pain.  Itching or soreness in the butt.  Bleeding from the butt.  Leaking poop.  Swelling in the area.  One or more lumps around the opening of your butt. How is this diagnosed? A doctor can often diagnose this condition by looking at the affected area. The doctor may also:  Do an exam that involves feeling the area with a gloved hand (digital rectal exam).  Examine the area inside your butt using a small tube (anoscope).  Order blood tests. This may be done if you have lost a lot of blood.  Have you get a test that involves looking inside the colon using a flexible tube with a camera on the end (sigmoidoscopy or colonoscopy). How is this treated? This condition can usually be treated at home. Your doctor may tell you to change what you eat, make  lifestyle changes, or try home treatments. If these do not help, procedures can be done to remove the hemorrhoids or make them smaller. These may involve:  Placing rubber bands at the base of the hemorrhoids to cut off their blood supply.  Injecting medicine into the hemorrhoids to shrink them.  Shining a type of light energy onto the hemorrhoids to cause them to fall off.  Doing surgery to remove the hemorrhoids or cut off their  blood supply. Follow these instructions at home: Eating and drinking   Eat foods that have a lot of fiber in them. These include whole grains, beans, nuts, fruits, and vegetables.  Ask your doctor about taking products that have added fiber (fibersupplements).  Reduce the amount of fat in your diet. You can do this by: ? Eating low-fat dairy products. ? Eating less red meat. ? Avoiding processed foods.  Drink enough fluid to keep your pee (urine) pale yellow. Managing pain and swelling   Take a warm-water bath (sitz bath) for 20 minutes to ease pain. Do this 3-4 times a day. You may do this in a bathtub or using a portable sitz bath that fits over the toilet.  If told, put ice on the painful area. It may be helpful to use ice between your warm baths. ? Put ice in a plastic bag. ? Place a towel between your skin and the bag. ? Leave the ice on for 20 minutes, 2-3 times a day. General instructions  Take over-the-counter and prescription medicines only as told by your doctor. ? Medicated creams and medicines may be used as told.  Exercise often. Ask your doctor how much and what kind of exercise is best for you.  Go to the bathroom when you have the urge to poop. Do not wait.  Avoid pushing too hard when you poop.  Keep your butt dry and clean. Use wet toilet paper or moist towelettes after pooping.  Do not sit on the toilet for a long time.  Keep all follow-up visits as told by your doctor. This is important. Contact a doctor if you:  Have pain and swelling that do not get better with treatment or medicine.  Have trouble pooping.  Cannot poop.  Have pain or swelling outside the area of the hemorrhoids. Get help right away if you have:  Bleeding that will not stop. Summary  Hemorrhoids are swollen veins in the butt or around the opening of the butt.  They can cause pain, itching, or bleeding.  Eat foods that have a lot of fiber in them. These include whole  grains, beans, nuts, fruits, and vegetables.  Take a warm-water bath (sitz bath) for 20 minutes to ease pain. Do this 3-4 times a day. This information is not intended to replace advice given to you by your health care provider. Make sure you discuss any questions you have with your health care provider. Document Released: 10/26/2007 Document Revised: 06/07/2017 Document Reviewed: 06/07/2017 Elsevier Interactive Patient Education  2019 [place] [facility], Adult, Care After This sheet gives you information about how to care for yourself after your procedure. Your health care provider may also give you more specific instructions. If you have problems or questions, contact your health care provider. What can I expect after the procedure? After the procedure, it is common to have:  A sore throat.  Mild stomach pain or discomfort.  Bloating.  Nausea. Follow these instructions at home:   Follow instructions from your  health care provider about what to eat or drink after your procedure.  Return to your normal activities as told by your health care provider. Ask your health care provider what activities are safe for you.  Take over-the-counter and prescription medicines only as told by your health care provider.  Do not drive for 24 hours if you were given a sedative during your procedure.  Keep all follow-up visits as told by your health care provider. This is important. Contact a health care provider if you have:  A sore throat that lasts longer than one day.  Trouble swallowing. Get help right away if:  You vomit blood or your vomit looks like coffee grounds.  You have: ? A fever. ? Bloody, black, or tarry stools. ? A severe sore throat or you cannot swallow. ? Difficulty breathing. ? Severe pain in your chest or abdomen. Summary  After the procedure, it is common to have a sore throat, mild stomach discomfort, bloating, and nausea.  Do not drive for 24  hours if you were given a sedative during the procedure.  Follow instructions from your health care provider about what to eat or drink after your procedure.  Return to your normal activities as told by your health care provider. This information is not intended to replace advice given to you by your health care provider. Make sure you discuss any questions you have with your health care provider. Document Released: 07/18/2011 Document Revised: 06/18/2017 Document Reviewed: 06/18/2017 Elsevier Interactive Patient Education  2019 [employer].    Colonoscopy, Adult, Care After This sheet gives you information about how to care for yourself after your procedure. Your doctor may also give you more specific instructions. If you have problems or questions, call your doctor. What can I expect after the procedure? After the procedure, it is common to have:  A small amount of blood in your poop for 24 hours.  Some gas.  Mild cramping or bloating in your belly. Follow these instructions at home: General instructions  For the first 24 hours after the procedure: ? Do not drive or use machinery. ? Do not sign important documents. ? Do not drink alcohol. ? Do your daily activities more slowly than normal. ? Eat foods that are soft and easy to digest.  Take over-the-counter or prescription medicines only as told by your doctor. To help cramping and bloating:   Try walking around.  Put heat on your belly (abdomen) as told by your doctor. Use a heat source that your doctor recommends, such as a moist heat pack or a heating pad. ? Put a towel between your skin and the heat source. ? Leave the heat on for 20-30 minutes. ? Remove the heat if your skin turns bright red. This is especially important if you cannot feel pain, heat, or cold. You can get burned. Eating and drinking   Drink enough fluid to keep your pee (urine) clear or pale yellow.  Return to your normal diet as told by your  doctor. Avoid heavy or fried foods that are hard to digest.  Avoid drinking alcohol for as long as told by your doctor. Contact a doctor if:  You have blood in your poop (stool) 2-3 days after the procedure. Get help right away if:  You have more than a small amount of blood in your poop.  You see large clumps of tissue (blood clots) in your poop.  Your belly is swollen.  You feel sick to your stomach (nauseous).  You throw up (vomit).  You have a fever.  You have belly pain that gets worse, and medicine does not help your pain. Summary  After the procedure, it is common to have a small amount of blood in your poop. You may also have mild cramping and bloating in your belly.  For the first 24 hours after the procedure, do not drive or use machinery, do not sign important documents, and do not drink alcohol.  Get help right away if you have a lot of blood in your poop, feel sick to your stomach, have a fever, or have more belly pain. This information is not intended to replace advice given to you by your health care provider. Make sure you discuss any questions you have with your health care provider. Document Released: 02/18/2010 Document Revised: 11/16/2016 Document Reviewed: 10/11/2015 Elsevier Interactive Patient Education  2019 [employer].

## 2018-05-31 NOTE — H&P (Signed)
@LOGO @   Primary Care Physician:  Asencion Noble, MD Primary Gastroenterologist:  Dr. Gala Romney  Pre-Procedure History & Physical: HPI:  [name] is a 68 y.o. female here for further evaluation of recurrent esophageal dysphagia and intermittent rectal bleeding.  Is been 10 years since she last had a colonoscopy.  Prior history of dysphagia responded to empiric dilation with a Venia Minks (63 [place]) in 2015.  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  Past Medical History:  Diagnosis Date  . Arthritis   . Breast cancer ([place]) 02/19/13  . Chronic back pain    reason unknown  . Constipation    doesn't take any medss  . Diverticulosis   . Headache(784.0)    occasionally  . History of bladder infections   . History of blood transfusion    no abnormal reaction noted  . History of colon polyps   . Hx of radiation therapy 05/13/13- 06/16/13   right breast 5000 cGy in 25 sessions  . Hypercholesteremia    takes Crestor daily  . Hypertension    takes HCTZ daily  . Hypothyroid    takes Synthroid daily  . Insomnia    takes Ambien nightly as needed  . Neck pain 05/13/2012   HNP  . Neuropathy   . Vertigo    doesn't take any meds    Past Surgical History:  Procedure Laterality Date  . ABDOMINAL HYSTERECTOMY  1983   partial  . ANTERIOR CERVICAL DECOMP/DISCECTOMY FUSION N/A 03/26/2014   Procedure: ANTERIOR CERVICAL DECOMPRESSION/DISCECTOMY FUSION 1 LEVEL;  Surgeon: Eustace Moore, MD;  Location: [facility];  Service: Neurosurgery;  Laterality: N/A;  ANTERIOR CERVICAL DECOMPRESSION/DISCECTOMY FUSION 1 LEVEL CERVICAL 5-6  . BREAST LUMPECTOMY Right 04/03/2013  . BREAST LUMPECTOMY WITH NEEDLE LOCALIZATION AND AXILLARY SENTINEL LYMPH NODE BX Right 04/03/2013   Procedure: BREAST LUMPECTOMY WITH NEEDLE LOCALIZATION AND AXILLARY SENTINEL LYMPH NODE BX;  Surgeon: Merrie Roof, MD;  Location: [place] [facility];  Service: General;  Laterality: Right;  .  CHOLECYSTECTOMY  1994  . COLONOSCOPY  09/17/2008   YJE:HUDJ tortuous, but otherwise normal-appearing colon/. Scattered diverticula  . ESOPHAGOGASTRODUODENOSCOPY  09/17/2008   RMR:Two mid esophageal diverticula/Small benign cystic mucosal lesions distal esophagus of doubtful  clinical significance, stable for least 5 years/ Small hiatal hernia, otherwise normal stomach D1, D2  . ESOPHAGOGASTRODUODENOSCOPY    02/10/2003   SHF:WYOVZC esophageal 3 cystic lesions without luminal compromise or evidence/Nonerosive antral gastritis/The esophagus was dilated by passing 56 [place] Maloney dilator.Marland Kitchen Epic notes states +H.pylori gastritis. treatment completed per epic notes.   . ESOPHAGOGASTRODUODENOSCOPY (EGD) WITH ESOPHAGEAL DILATION N/A 02/03/2013   HYI:FOYDXA esophageal duplication cyst. Small esophageal diverticulum. Otherwise;  EGD normal - Widely patent tubular esophagus before and after dilation.  Status post passage of  a  Maloney dilator.  . EUS  Aug 2011   Dr. Newman Pies: EGD with multiple lower esophageal submucosal nodules, stomach and duodenum normal, EUS with esophageal duplication cysts  . THYROID SURGERY  2011    Prior to Admission medications   Medication Sig Start Date End Date Taking? Authorizing Provider  amLODipine (NORVASC) 2.5 MG tablet Take 2.5 mg by mouth daily. 04/18/18  Yes [provider]  fluticasone (FLONASE) 50 MCG/ACT nasal spray Place 2 sprays into both nostrils daily as needed for allergies. 04/30/18  Yes [provider]  gabapentin (NEURONTIN) 100 MG capsule Take 100 mg by mouth 2 (two) times daily as needed (  arm/shoulder pain.).    Yes [provider]  hydrochlorothiazide (HYDRODIURIL) 25 MG tablet Take 25 mg by mouth daily.     Yes [provider]  levothyroxine (SYNTHROID) 75 MCG tablet Take 75 mcg by mouth daily before breakfast.   Yes [provider]  meloxicam (MOBIC) 15 MG tablet Take 15 mg by mouth daily as needed (back pain.).    Yes [provider]  pantoprazole (PROTONIX) 40 MG tablet Take 1 tablet (40 mg total) by mouth daily. Patient taking differently: Take 40 mg by mouth daily as needed (heartburn/indigestion/reflux.).  12/29/17  Yes Sherwood Gambler, MD  potassium chloride SA (K-DUR,KLOR-CON) 20 MEQ tablet Take 20 mEq by mouth daily.   Yes [provider]  rosuvastatin (CRESTOR) 5 MG tablet Take 5 mg by mouth daily.    Yes [provider]  zolpidem (AMBIEN) 5 MG tablet Take 2.5 mg by mouth at bedtime as needed for sleep.  01/21/14  Yes [provider]  amoxicillin-clavulanate (AUGMENTIN) 875-125 MG tablet Take 1 tablet by mouth 2 (two) times daily. One po bid x 7 days Patient not taking: Reported on 05/24/2018 04/10/18   Palumbo, April, MD  polyethylene glycol-electrolytes (TRILYTE) 420 g solution Take 4,000 mLs by mouth as directed. Patient not taking: Reported on 04/10/2018 04/01/18   Daneil Dolin, MD  traMADol (ULTRAM) 50 MG tablet Take 1 tablet (50 mg total) by mouth every 6 (six) hours as needed. Patient not taking: Reported on 05/24/2018 05/12/18   Veryl Speak, MD    Allergies as of 05/21/2018 - Review Complete 05/12/2018  Allergen Reaction Noted  . Codeine Nausea And Vomiting   . Percocet [oxycodone-acetaminophen] Rash 12/04/2010    Family History  Problem Relation Age of Onset  . Lupus Sister   . Hypertension Brother   . Diabetes Brother   . Hypertension Brother   . Diabetes Brother   . Multiple sclerosis Mother   . Cancer Father        "stomach"  . Breast cancer Daughter 52  . Colon cancer Neg Hx     Social History   Socioeconomic History  . Marital status: Married    Spouse name: Joe  . Number of children: 3  . Years of education: 37  . Highest education level: Not on file  Occupational History  . Occupation: Retired  Scientific laboratory technician  . Financial resource strain: Not on file  . Food insecurity:    Worry: Not on file    Inability: Not on file  .  Transportation needs:    Medical: Not on file    Non-medical: Not on file  Tobacco Use  . Smoking status: Never Smoker  . Smokeless tobacco: Never Used  Substance and Sexual Activity  . Alcohol use: No  . Drug use: No  . Sexual activity: Yes    Birth control/protection: Surgical    Comment: menarche age 65, P71, first live birth age 23, menopause 32, no HRT, HYST  Lifestyle  . Physical activity:    Days per week: Not on file    Minutes per session: Not on file  . Stress: Not on file  Relationships  . Social connections:    Talks on phone: Not on file    Gets together: Not on file    Attends religious service: Not on file    Active member of club or organization: Not on file    Attends meetings of clubs or organizations: Not on file    Relationship  status: Not on file  . Intimate partner violence:    Fear of current or ex partner: Not on file    Emotionally abused: Not on file    Physically abused: Not on file    Forced sexual activity: Not on file  Other Topics Concern  . Not on file  Social History Narrative   Patient lives at home with her husband Delayne Sanzo.    Patient has 3 children.    Patient is retired.    Patient has an 11th grade education.           Review of Systems: See HPI, otherwise negative ROS  Physical Exam: BP (!) 146/90   Pulse 89   Temp 98.3 F (36.8 C) (Oral)   Resp 17   SpO2 98%  General:   Alert,  Well-developed, well-nourished, pleasant and cooperative in NAD Mouth:  No deformity or lesions. Neck:  Supple; no masses or thyromegaly. No significant cervical adenopathy. Lungs:  Clear throughout to auscultation.   No wheezes, crackles, or rhonchi. No acute distress. Heart:  Regular rate and rhythm; no murmurs, clicks, rubs,  or gallops. Abdomen: Non-distended, normal bowel sounds.  Soft and nontender without appreciable mass or hepatosplenomegaly.  Pulses:  Normal pulses noted. Extremities:  Without clubbing or edema.  Impression/Plan:  Pleasant 68 year old lady with a history of GERD now with recurrent esophageal dysphagia.  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  Intermittent rectal bleeding.  Patient is here for EGD with possible esophageal dilation and diagnostic colonoscopy per plan. The risks, benefits, limitations, imponderables and alternatives regarding both EGD and colonoscopy have been reviewed with the patient. Questions have been answered. All parties agreeable.      Notice: This dictation was prepared with Dragon dictation along with smaller phrase technology. Any transcriptional errors that result from this process are unintentional and may not be corrected upon review.

## 2018-05-31 NOTE — Op Note (Signed)
[facility] Patient Name: [name] Procedure Date: 05/31/2018 8:41 AM MRN: [id number] Date of Birth: 07-18-1950 Attending MD: Norvel Richards , MD CSN: [id number] Age: 68 Admit Type: Outpatient Procedure:                Colonoscopy Indications:              Hematochezia Providers:                Norvel Richards, MD, Jeanann Lewandowsky. Sharon Seller, RN,                            Raphael Gibney, Technician, Aram Candela Referring MD:             Asencion Noble Medicines:                Midazolam 11 mg IV, Meperidine 50 mg IV,                            Ondansetron 4 mg IV Complications:            No immediate complications. Estimated Blood Loss:     Estimated blood loss was minimal. Procedure:                Pre-Anesthesia Assessment:                           - Prior to the procedure, a History and Physical                            was performed, and patient medications and                            allergies were reviewed. The patient's tolerance of                            previous anesthesia was also reviewed. The risks                            and benefits of the procedure and the sedation                            options and risks were discussed with the patient.                            All questions were answered, and informed consent                            was obtained. Prior Anticoagulants: The patient has                            taken no previous anticoagulant or antiplatelet                            agents. ASA Grade Assessment: II - A patient with  mild systemic disease. After reviewing the risks                            and benefits, the patient was deemed in                            satisfactory condition to undergo the procedure.                           After obtaining informed consent, the colonoscope                            was passed under direct vision. Throughout the                            procedure, the  patient's blood pressure, pulse, and                            oxygen saturations were monitored continuously. The                            CF-[id number] ([id number]) scope was introduced through                            the anus and advanced to the the cecum, identified                            by appendiceal orifice and ileocecal valve. The                            colonoscopy was performed without difficulty. The                            patient tolerated the procedure well. The quality                            of the bowel preparation was adequate. The                            ileocecal valve, appendiceal orifice, and rectum                            were photographed. Scope In: 8:44:07 AM Scope Out: 9:06:28 AM Scope Withdrawal Time: 0 hours 10 minutes 5 seconds  Total Procedure Duration: 0 hours 22 minutes 21 seconds  Findings:      The perianal and digital rectal examinations were normal.      Scattered medium-mouthed diverticula were found in the sigmoid colon and       descending colon.      Two sessile polyps were found in the ascending colon. The polyps were 3       to 5 mm in size. These polyps were removed with a cold snare. Resection       and retrieval were complete. Estimated blood loss was minimal.      Non-bleeding internal hemorrhoids  were found during retroflexion. The       hemorrhoids were mild, small and Grade I (internal hemorrhoids that do       not prolapse).      The exam was otherwise without abnormality on direct and retroflexion       views. Impression:               - Diverticulosis in the sigmoid colon and in the                            descending colon.                           - Two 3 to 5 mm polyps in the ascending colon,                            removed with a cold snare. Resected and retrieved.                           - Non-bleeding internal hemorrhoids.                           - The examination was otherwise normal on direct                             and retroflexion views. Moderate Sedation:      Moderate (conscious) sedation was administered by the endoscopy nurse       and supervised by the endoscopist. The following parameters were       monitored: oxygen saturation, heart rate, blood pressure, respiratory       rate, EKG, adequacy of pulmonary ventilation, and response to care.       Total physician intraservice time was 54 minutes. Recommendation:           - Patient has a contact number available for                            emergencies. The signs and symptoms of potential                            delayed complications were discussed with the                            patient. Return to normal activities tomorrow.                            Written discharge instructions were provided to the                            patient.                           - Advance diet as tolerated.                           - Continue present medications. Begin Benefiber 1  tablespoon daily x3 weeks; then increase to 1                            tablespoon twice daily thereafter. Anusol HC cream                            dispense 1 unit apply pea-sized amount to the                            anorectum 4 times daily as needed.                           - Repeat colonoscopy date to be determined after                            pending pathology results are reviewed for                            surveillance based on pathology results.                           - Return to GI office in 3 months. See EGD report.                            At patient's request I spoke to her husband, [name],                            at [phone] Procedure Code(s):        --- Professional ---                           [phone], Colonoscopy, flexible; with removal of                            tumor(s), polyp(s), or other lesion(s) by snare                            technique                           99153,  Moderate sedation; each additional 15                            minutes intraservice time                           99153, Moderate sedation; each additional 15                            minutes intraservice time                           99153, Moderate sedation; each additional 15  minutes intraservice time                           G0500, Moderate sedation services provided by the                            same physician or other qualified health care                            professional performing a gastrointestinal                            endoscopic service that sedation supports,                            requiring the presence of an independent trained                            observer to assist in the monitoring of the                            patient's level of consciousness and physiological                            status; initial 15 minutes of intra-service time;                            patient age 11 years or older (additional time may                            be reported with [phone], as appropriate) Diagnosis Code(s):        --- Professional ---                           K64.0, First degree hemorrhoids                           K63.5, Polyp of colon                           K92.1, Melena (includes Hematochezia)                           K57.30, Diverticulosis of large intestine without                            perforation or abscess without bleeding CPT copyright 2019 [employer]. All rights reserved. The codes documented in this report are preliminary and upon coder review may  be revised to meet current compliance requirements. Cristopher Estimable. Dreshawn Hendershott, MD Norvel Richards, MD 05/31/2018 9:19:54 AM This report has been signed electronically. Number of Addenda: 0

## 2018-05-31 NOTE — Op Note (Signed)
[facility] Patient Name: [name] Procedure Date: 05/31/2018 7:34 AM MRN: [id number] Date of Birth: 08/11/1950 Attending MD: Norvel Richards , MD CSN: [id number] Age: 68 Admit Type: Outpatient Procedure:                Upper GI endoscopy Indications:              Dysphagia Providers:                Norvel Richards, MD, Jeanann Lewandowsky. Sharon Seller, RN,                            Raphael Gibney, Technician, Aram Candela Referring MD:              Medicines:                Midazolam 6 mg IV, Meperidine 40 mg IV, Ondansetron                            4 mg IV Complications:            No immediate complications. Estimated Blood Loss:     Estimated blood loss: none. Procedure:                Pre-Anesthesia Assessment:                           - Prior to the procedure, a History and Physical                            was performed, and patient medications and                            allergies were reviewed. The patient's tolerance of                            previous anesthesia was also reviewed. The risks                            and benefits of the procedure and the sedation                            options and risks were discussed with the patient.                            All questions were answered, and informed consent                            was obtained. Prior Anticoagulants: The patient has                            taken no previous anticoagulant or antiplatelet                            agents. ASA Grade Assessment: II - A patient with  mild systemic disease. After reviewing the risks                            and benefits, the patient was deemed in                            satisfactory condition to undergo the procedure.                           After obtaining informed consent, the endoscope was                            passed under direct vision. Throughout the                            procedure, the patient's blood  pressure, pulse, and                            oxygen saturations were monitored continuously. The                            [id number]-[id number] ([id number]) was introduced through the                            mouth, and advanced to the second part of duodenum.                            The upper GI endoscopy was accomplished without                            difficulty. The patient tolerated the procedure                            well. Scope In: 8:29:47 AM Scope Out: 8:36:09 AM Total Procedure Duration: 0 hours 6 minutes 22 seconds  Findings:      Single small shallow mid esophageal diverticulum?"chronic finding. 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 Tubular esophagus appeared widely patent throughout its course;       mucosa otherwise appeared normal.      The entire examined stomach was normal.      The duodenal bulb and second portion of the duodenum were normal. Scope       was withdrawn and a 54 French Maloney dilator was passed to full       insertion easily. Subsequently a 83 French Maloney dilator was passed in       similar fashion. A look back revealed no apparent complication to these       maneuvers. 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 Status post [facility] dilation.  Normal stomach.                           - Normal duodenal bulb and second portion of the                            duodenum.                           - No specimens collected. Moderate Sedation:      Moderate (conscious) sedation was administered by the endoscopy nurse       and supervised by the endoscopist. The following parameters were       monitored: oxygen saturation, heart rate, blood pressure, respiratory       rate, EKG, adequacy of pulmonary ventilation, and response to care.       Total physician intraservice time was 24  minutes. Recommendation:           - Patient has a contact number available for                            emergencies. The signs and symptoms of potential                            delayed complications were discussed with the                            patient. Return to normal activities tomorrow.                            Written discharge instructions were provided to the                            patient.                           - Advance diet as tolerated. Continue Protonix 40                            mg daily. See colonoscopy report. Procedure Code(s):        --- Professional ---                           [phone], Esophagogastroduodenoscopy, flexible,                            transoral; diagnostic, including collection of                            specimen(s) by brushing or washing, when performed                            (separate procedure)                           [id number], Moderate sedation; each additional 15  minutes intraservice time                           G0500, Moderate sedation services provided by the                            same physician or other qualified health care                            professional performing a gastrointestinal                            endoscopic service that sedation supports,                            requiring the presence of an independent trained                            observer to assist in the monitoring of the                            patient's level of consciousness and physiological                            status; initial 15 minutes of intra-service time;                            patient age 74 years or older (additional time may                            be reported with [phone], as appropriate) Diagnosis Code(s):        --- Professional ---                           R13.10, Dysphagia, unspecified CPT copyright 2019 American Medical Association. All rights reserved. The codes  documented in this report are preliminary and upon coder review may  be revised to meet current compliance requirements. Cristopher Estimable. Marjan Rosman, MD Norvel Richards, MD 05/31/2018 8:42:36 AM This report has been signed electronically. Number of Addenda: 0

## 2018-06-04 ENCOUNTER — Encounter: Payer: Self-pay | Admitting: Internal Medicine

## 2018-06-04 ENCOUNTER — Encounter (HOSPITAL_COMMUNITY): Payer: Self-pay | Admitting: Internal Medicine

## 2018-07-11 DIAGNOSIS — M25521 Pain in right elbow: Secondary | ICD-10-CM | POA: Insufficient documentation

## 2018-07-15 ENCOUNTER — Other Ambulatory Visit: Payer: Self-pay

## 2018-07-15 ENCOUNTER — Other Ambulatory Visit: Payer: Medicare Other

## 2018-07-15 DIAGNOSIS — Z20822 Contact with and (suspected) exposure to covid-19: Secondary | ICD-10-CM

## 2018-07-23 LAB — NOVEL CORONAVIRUS, NAA: SARS-CoV-2, NAA: NOT DETECTED

## 2018-11-07 ENCOUNTER — Other Ambulatory Visit: Payer: Self-pay | Admitting: Urology

## 2018-11-07 ENCOUNTER — Other Ambulatory Visit (HOSPITAL_COMMUNITY): Payer: Self-pay | Admitting: Urology

## 2018-11-07 DIAGNOSIS — R3129 Other microscopic hematuria: Secondary | ICD-10-CM

## 2018-11-27 ENCOUNTER — Other Ambulatory Visit: Payer: Self-pay | Admitting: *Deleted

## 2018-11-27 DIAGNOSIS — Z20822 Contact with and (suspected) exposure to covid-19: Secondary | ICD-10-CM

## 2018-11-28 LAB — NOVEL CORONAVIRUS, NAA: SARS-CoV-2, NAA: NOT DETECTED

## 2018-12-10 ENCOUNTER — Telehealth: Payer: Self-pay

## 2018-12-10 NOTE — Telephone Encounter (Signed)
Pt. Called for COVID 19 results, Given, verbalizes understanding.

## 2018-12-13 ENCOUNTER — Ambulatory Visit (HOSPITAL_COMMUNITY)
Admission: RE | Admit: 2018-12-13 | Discharge: 2018-12-13 | Disposition: A | Discharge status: Home / Self Care | Payer: Medicare Other | Source: Ambulatory Visit | Attending: Urology | Admitting: Urology

## 2018-12-13 ENCOUNTER — Other Ambulatory Visit: Payer: Self-pay

## 2018-12-13 DIAGNOSIS — R3129 Other microscopic hematuria: Secondary | ICD-10-CM | POA: Insufficient documentation

## 2018-12-18 ENCOUNTER — Ambulatory Visit (INDEPENDENT_AMBULATORY_CARE_PROVIDER_SITE_OTHER): Payer: Medicare Other | Admitting: Urology

## 2018-12-18 DIAGNOSIS — R3129 Other microscopic hematuria: Secondary | ICD-10-CM

## 2018-12-18 DIAGNOSIS — N393 Stress incontinence (female) (male): Secondary | ICD-10-CM | POA: Diagnosis not present

## 2019-02-10 DIAGNOSIS — G4700 Insomnia, unspecified: Secondary | ICD-10-CM | POA: Diagnosis not present

## 2019-02-10 DIAGNOSIS — I10 Essential (primary) hypertension: Secondary | ICD-10-CM | POA: Diagnosis not present

## 2019-02-14 ENCOUNTER — Other Ambulatory Visit: Payer: Self-pay

## 2019-02-14 ENCOUNTER — Ambulatory Visit: Payer: Medicare PPO | Attending: Internal Medicine

## 2019-02-14 DIAGNOSIS — Z20822 Contact with and (suspected) exposure to covid-19: Secondary | ICD-10-CM

## 2019-02-15 LAB — NOVEL CORONAVIRUS, NAA: SARS-CoV-2, NAA: NOT DETECTED

## 2019-02-18 ENCOUNTER — Other Ambulatory Visit: Payer: Self-pay | Admitting: Oncology

## 2019-02-18 DIAGNOSIS — Z1231 Encounter for screening mammogram for malignant neoplasm of breast: Secondary | ICD-10-CM

## 2019-02-25 DIAGNOSIS — M25511 Pain in right shoulder: Secondary | ICD-10-CM | POA: Diagnosis not present

## 2019-03-05 DIAGNOSIS — C50411 Malignant neoplasm of upper-outer quadrant of right female breast: Secondary | ICD-10-CM | POA: Diagnosis not present

## 2019-03-05 DIAGNOSIS — Z171 Estrogen receptor negative status [ER-]: Secondary | ICD-10-CM | POA: Diagnosis not present

## 2019-03-05 DIAGNOSIS — D0511 Intraductal carcinoma in situ of right breast: Secondary | ICD-10-CM | POA: Diagnosis not present

## 2019-03-05 DIAGNOSIS — D649 Anemia, unspecified: Secondary | ICD-10-CM | POA: Diagnosis not present

## 2019-03-05 DIAGNOSIS — M792 Neuralgia and neuritis, unspecified: Secondary | ICD-10-CM | POA: Diagnosis not present

## 2019-03-05 DIAGNOSIS — M25511 Pain in right shoulder: Secondary | ICD-10-CM | POA: Diagnosis not present

## 2019-03-05 DIAGNOSIS — Z923 Personal history of irradiation: Secondary | ICD-10-CM | POA: Diagnosis not present

## 2019-03-08 ENCOUNTER — Ambulatory Visit: Payer: Medicare PPO | Attending: Internal Medicine

## 2019-03-08 DIAGNOSIS — Z23 Encounter for immunization: Secondary | ICD-10-CM | POA: Insufficient documentation

## 2019-03-08 NOTE — Progress Notes (Signed)
   Covid-19 Vaccination Clinic  Name:  [name]    MRN: [id number] DOB: August 30, 1950  03/08/2019  [name] was observed post Covid-19 immunization for 15 minutes without incidence. She was provided with Vaccine Information Sheet and instruction to access the V-Safe system.   [name] was instructed to call 911 with any severe reactions post vaccine: Marland Kitchen Difficulty breathing  . Swelling of your face and throat  . A fast heartbeat  . A bad rash all over your body  . Dizziness and weakness    Immunizations Administered    Name Date Dose VIS Date Route   Moderna COVID-19 Vaccine 03/08/2019  1:18 PM 0.5 mL 12/31/2018 Intramuscular   Manufacturer: Moderna   Lot: [id number]   Candelaria [id number]

## 2019-03-27 ENCOUNTER — Ambulatory Visit: Payer: Medicare PPO

## 2019-04-08 ENCOUNTER — Ambulatory Visit: Payer: Medicare PPO | Attending: Internal Medicine

## 2019-04-08 DIAGNOSIS — Z23 Encounter for immunization: Secondary | ICD-10-CM

## 2019-04-08 NOTE — Progress Notes (Signed)
   Covid-19 Vaccination Clinic  Name:  [name]    MRN: [id number] DOB: 1950-02-15  04/08/2019  [name] was observed post Covid-19 immunization for 15 minutes without incident. She was provided with Vaccine Information Sheet and instruction to access the V-Safe system.   [name] was instructed to call 911 with any severe reactions post vaccine: Marland Kitchen Difficulty breathing  . Swelling of face and throat  . A fast heartbeat  . A bad rash all over body  . Dizziness and weakness   Immunizations Administered    Name Date Dose VIS Date Route   Moderna COVID-19 Vaccine 04/08/2019  1:06 PM 0.5 mL 12/31/2018 Intramuscular   Manufacturer: Moderna   Lot: [id number]   AmandaVO:[id number]

## 2019-04-28 DIAGNOSIS — C50419 Malignant neoplasm of upper-outer quadrant of unspecified female breast: Secondary | ICD-10-CM | POA: Diagnosis not present

## 2019-04-28 DIAGNOSIS — E890 Postprocedural hypothyroidism: Secondary | ICD-10-CM | POA: Diagnosis not present

## 2019-04-28 DIAGNOSIS — I10 Essential (primary) hypertension: Secondary | ICD-10-CM | POA: Diagnosis not present

## 2019-04-28 DIAGNOSIS — R600 Localized edema: Secondary | ICD-10-CM | POA: Diagnosis not present

## 2019-04-28 DIAGNOSIS — E7800 Pure hypercholesterolemia, unspecified: Secondary | ICD-10-CM | POA: Diagnosis not present

## 2019-04-28 DIAGNOSIS — E049 Nontoxic goiter, unspecified: Secondary | ICD-10-CM | POA: Diagnosis not present

## 2019-04-28 DIAGNOSIS — F4322 Adjustment disorder with anxiety: Secondary | ICD-10-CM | POA: Diagnosis not present

## 2019-05-13 ENCOUNTER — Ambulatory Visit: Payer: Medicare PPO

## 2019-05-14 ENCOUNTER — Ambulatory Visit
Admission: RE | Admit: 2019-05-14 | Discharge: 2019-05-14 | Disposition: A | Discharge status: Home / Self Care | Payer: Medicare PPO | Source: Ambulatory Visit | Attending: Oncology | Admitting: Oncology

## 2019-05-14 ENCOUNTER — Other Ambulatory Visit: Payer: Self-pay

## 2019-05-14 DIAGNOSIS — Z1231 Encounter for screening mammogram for malignant neoplasm of breast: Secondary | ICD-10-CM

## 2019-06-03 DIAGNOSIS — G4700 Insomnia, unspecified: Secondary | ICD-10-CM | POA: Diagnosis not present

## 2019-06-03 DIAGNOSIS — I10 Essential (primary) hypertension: Secondary | ICD-10-CM | POA: Diagnosis not present

## 2019-06-03 DIAGNOSIS — Z79899 Other long term (current) drug therapy: Secondary | ICD-10-CM | POA: Diagnosis not present

## 2019-06-10 DIAGNOSIS — I10 Essential (primary) hypertension: Secondary | ICD-10-CM | POA: Diagnosis not present

## 2019-06-10 DIAGNOSIS — Z6829 Body mass index (BMI) 29.0-29.9, adult: Secondary | ICD-10-CM | POA: Diagnosis not present

## 2019-06-10 DIAGNOSIS — E785 Hyperlipidemia, unspecified: Secondary | ICD-10-CM | POA: Diagnosis not present

## 2019-06-10 DIAGNOSIS — B353 Tinea pedis: Secondary | ICD-10-CM | POA: Diagnosis not present

## 2019-07-03 DIAGNOSIS — I10 Essential (primary) hypertension: Secondary | ICD-10-CM | POA: Diagnosis not present

## 2019-07-03 DIAGNOSIS — E7800 Pure hypercholesterolemia, unspecified: Secondary | ICD-10-CM | POA: Diagnosis not present

## 2019-07-03 DIAGNOSIS — C50419 Malignant neoplasm of upper-outer quadrant of unspecified female breast: Secondary | ICD-10-CM | POA: Diagnosis not present

## 2019-07-03 DIAGNOSIS — E0500 Thyrotoxicosis with diffuse goiter without thyrotoxic crisis or storm: Secondary | ICD-10-CM | POA: Diagnosis not present

## 2019-07-03 DIAGNOSIS — R600 Localized edema: Secondary | ICD-10-CM | POA: Diagnosis not present

## 2019-07-10 DIAGNOSIS — E7800 Pure hypercholesterolemia, unspecified: Secondary | ICD-10-CM | POA: Diagnosis not present

## 2019-07-10 DIAGNOSIS — I10 Essential (primary) hypertension: Secondary | ICD-10-CM | POA: Diagnosis not present

## 2019-07-10 DIAGNOSIS — E049 Nontoxic goiter, unspecified: Secondary | ICD-10-CM | POA: Diagnosis not present

## 2019-07-10 DIAGNOSIS — C50419 Malignant neoplasm of upper-outer quadrant of unspecified female breast: Secondary | ICD-10-CM | POA: Diagnosis not present

## 2019-07-10 DIAGNOSIS — R600 Localized edema: Secondary | ICD-10-CM | POA: Diagnosis not present

## 2019-07-10 DIAGNOSIS — E890 Postprocedural hypothyroidism: Secondary | ICD-10-CM | POA: Diagnosis not present

## 2019-10-01 DIAGNOSIS — N390 Urinary tract infection, site not specified: Secondary | ICD-10-CM | POA: Diagnosis not present

## 2019-10-10 DIAGNOSIS — E0500 Thyrotoxicosis with diffuse goiter without thyrotoxic crisis or storm: Secondary | ICD-10-CM | POA: Diagnosis not present

## 2019-10-10 DIAGNOSIS — E049 Nontoxic goiter, unspecified: Secondary | ICD-10-CM | POA: Diagnosis not present

## 2019-10-10 DIAGNOSIS — E7800 Pure hypercholesterolemia, unspecified: Secondary | ICD-10-CM | POA: Diagnosis not present

## 2019-10-10 DIAGNOSIS — Z7189 Other specified counseling: Secondary | ICD-10-CM | POA: Diagnosis not present

## 2019-10-10 DIAGNOSIS — C50419 Malignant neoplasm of upper-outer quadrant of unspecified female breast: Secondary | ICD-10-CM | POA: Diagnosis not present

## 2019-10-10 DIAGNOSIS — R600 Localized edema: Secondary | ICD-10-CM | POA: Diagnosis not present

## 2019-10-10 DIAGNOSIS — I10 Essential (primary) hypertension: Secondary | ICD-10-CM | POA: Diagnosis not present

## 2019-10-10 DIAGNOSIS — E890 Postprocedural hypothyroidism: Secondary | ICD-10-CM | POA: Diagnosis not present

## 2019-10-17 DIAGNOSIS — M7591 Shoulder lesion, unspecified, right shoulder: Secondary | ICD-10-CM | POA: Diagnosis not present

## 2019-10-17 DIAGNOSIS — Z9889 Other specified postprocedural states: Secondary | ICD-10-CM | POA: Diagnosis not present

## 2019-10-17 DIAGNOSIS — M75101 Unspecified rotator cuff tear or rupture of right shoulder, not specified as traumatic: Secondary | ICD-10-CM | POA: Diagnosis not present

## 2019-10-17 DIAGNOSIS — M7581 Other shoulder lesions, right shoulder: Secondary | ICD-10-CM | POA: Diagnosis not present

## 2019-10-17 DIAGNOSIS — Z886 Allergy status to analgesic agent status: Secondary | ICD-10-CM | POA: Diagnosis not present

## 2019-10-17 DIAGNOSIS — D0510 Intraductal carcinoma in situ of unspecified breast: Secondary | ICD-10-CM | POA: Diagnosis not present

## 2019-10-17 DIAGNOSIS — C50911 Malignant neoplasm of unspecified site of right female breast: Secondary | ICD-10-CM | POA: Diagnosis not present

## 2019-10-17 DIAGNOSIS — G629 Polyneuropathy, unspecified: Secondary | ICD-10-CM | POA: Diagnosis not present

## 2019-10-17 DIAGNOSIS — Z79899 Other long term (current) drug therapy: Secondary | ICD-10-CM | POA: Diagnosis not present

## 2019-10-17 DIAGNOSIS — M25511 Pain in right shoulder: Secondary | ICD-10-CM | POA: Diagnosis not present

## 2019-10-17 DIAGNOSIS — Z885 Allergy status to narcotic agent status: Secondary | ICD-10-CM | POA: Diagnosis not present

## 2019-10-17 DIAGNOSIS — N644 Mastodynia: Secondary | ICD-10-CM | POA: Diagnosis not present

## 2019-10-28 ENCOUNTER — Other Ambulatory Visit: Payer: Self-pay

## 2019-10-28 ENCOUNTER — Emergency Department (HOSPITAL_COMMUNITY)
Admission: EM | Admit: 2019-10-28 | Discharge: 2019-10-28 | Disposition: A | Discharge status: Home / Self Care | Payer: Medicare PPO | Attending: Emergency Medicine | Admitting: Emergency Medicine

## 2019-10-28 ENCOUNTER — Encounter (HOSPITAL_COMMUNITY): Payer: Self-pay | Admitting: *Deleted

## 2019-10-28 ENCOUNTER — Emergency Department (HOSPITAL_COMMUNITY): Payer: Medicare PPO

## 2019-10-28 DIAGNOSIS — Z853 Personal history of malignant neoplasm of breast: Secondary | ICD-10-CM | POA: Insufficient documentation

## 2019-10-28 DIAGNOSIS — I10 Essential (primary) hypertension: Secondary | ICD-10-CM | POA: Diagnosis not present

## 2019-10-28 DIAGNOSIS — Z79899 Other long term (current) drug therapy: Secondary | ICD-10-CM | POA: Diagnosis not present

## 2019-10-28 DIAGNOSIS — T3695XA Adverse effect of unspecified systemic antibiotic, initial encounter: Secondary | ICD-10-CM | POA: Diagnosis not present

## 2019-10-28 DIAGNOSIS — K521 Toxic gastroenteritis and colitis: Secondary | ICD-10-CM | POA: Diagnosis not present

## 2019-10-28 DIAGNOSIS — E039 Hypothyroidism, unspecified: Secondary | ICD-10-CM | POA: Diagnosis not present

## 2019-10-28 DIAGNOSIS — K760 Fatty (change of) liver, not elsewhere classified: Secondary | ICD-10-CM | POA: Diagnosis not present

## 2019-10-28 DIAGNOSIS — R109 Unspecified abdominal pain: Secondary | ICD-10-CM | POA: Diagnosis not present

## 2019-10-28 DIAGNOSIS — T65891A Toxic effect of other specified substances, accidental (unintentional), initial encounter: Secondary | ICD-10-CM | POA: Insufficient documentation

## 2019-10-28 DIAGNOSIS — R197 Diarrhea, unspecified: Secondary | ICD-10-CM | POA: Diagnosis not present

## 2019-10-28 DIAGNOSIS — R1032 Left lower quadrant pain: Secondary | ICD-10-CM | POA: Diagnosis present

## 2019-10-28 LAB — CBC
HCT: 42.2 % (ref 36.0–46.0)
Hemoglobin: 12.8 g/dL (ref 12.0–15.0)
MCH: 28.6 pg (ref 26.0–34.0)
MCHC: 30.3 g/dL (ref 30.0–36.0)
MCV: 94.2 fL (ref 80.0–100.0)
Platelets: 205 10*3/uL (ref 150–400)
RBC: 4.48 MIL/uL (ref 3.87–5.11)
RDW: 12.9 % (ref 11.5–15.5)
WBC: 7.7 10*3/uL (ref 4.0–10.5)
nRBC: 0 % (ref 0.0–0.2)

## 2019-10-28 LAB — URINALYSIS, ROUTINE W REFLEX MICROSCOPIC
Bacteria, UA: NONE SEEN
Bilirubin Urine: NEGATIVE
Glucose, UA: NEGATIVE mg/dL
Ketones, ur: NEGATIVE mg/dL
Leukocytes,Ua: NEGATIVE
Nitrite: NEGATIVE
Protein, ur: NEGATIVE mg/dL
Specific Gravity, Urine: 1.005 (ref 1.005–1.030)
pH: 6 (ref 5.0–8.0)

## 2019-10-28 LAB — COMPREHENSIVE METABOLIC PANEL
ALT: 24 U/L (ref 0–44)
AST: 29 U/L (ref 15–41)
Albumin: 4.1 g/dL (ref 3.5–5.0)
Alkaline Phosphatase: 54 U/L (ref 38–126)
Anion gap: 13 (ref 5–15)
BUN: 9 mg/dL (ref 8–23)
CO2: 24 mmol/L (ref 22–32)
Calcium: 9 mg/dL (ref 8.9–10.3)
Chloride: 100 mmol/L (ref 98–111)
Creatinine, Ser: 0.8 mg/dL (ref 0.44–1.00)
GFR calc Af Amer: >60 mL/min (ref >60)
GFR calc non Af Amer: >60 mL/min (ref >60)
Glucose, Bld: 84 mg/dL (ref 70–99)
Potassium: 3.3 mmol/L — ABNORMAL LOW (ref 3.5–5.1)
Sodium: 137 mmol/L (ref 135–145)
Total Bilirubin: 0.9 mg/dL (ref 0.3–1.2)
Total Protein: 7.9 g/dL (ref 6.5–8.1)

## 2019-10-28 LAB — LIPASE, BLOOD: Lipase: 33 U/L (ref 11–51)

## 2019-10-28 MED ORDER — IOHEXOL 300 MG/ML  SOLN
100.0000 mL | Freq: Once | INTRAMUSCULAR | Status: AC | PRN
  Administered 2019-10-28: 100 mL via INTRAVENOUS

## 2019-10-28 MED ORDER — DICYCLOMINE HCL 10 MG PO CAPS
10.0000 mg | ORAL_CAPSULE | Freq: Once | ORAL | Status: AC
  Administered 2019-10-28: 10 mg via ORAL
  Filled 2019-10-28: qty 1

## 2019-10-28 MED ORDER — DICYCLOMINE HCL 20 MG PO TABS: 20.0000 mg | ORAL_TABLET | Freq: Three times a day (TID) | ORAL | 0 refills | Status: DC

## 2019-10-28 NOTE — ED Triage Notes (Signed)
Pt c/o LLQ abdominal pain and diarrhea x 2 weeks. Denies n/v.

## 2019-10-28 NOTE — Discharge Instructions (Addendum)
You were seen in the ER for abdominal pain and diarrhea  CT scan shows liquid stool in parts of your colon. No other acute abnormalities or diverticulitis  I think your symptoms could have been from a virus or from antibiotic use  Given your other lab work that was normal we will defer antibiotics today  Please take [phone] mg acetaminophen every 6-8 hours for pain.  Use bentyl with meals three times a day which will help with gut spasms and pain.  Increase probiotics (greek yogurt or Kefir, daily). Avoid foods that will increase gas and diarrhea  Return to ER for worsening severe constant pain, nausea, vomiting, fever, worsening diarrhea or blood in stool

## 2019-10-28 NOTE — ED Provider Notes (Signed)
[facility] EMERGENCY DEPARTMENT Provider Note   CSN: [id number] Arrival date & time: 10/28/19  1049     History Chief Complaint  Patient presents with  . Abdominal Pain    [name] is a 69 y.o. female presents to ER for evaluation of abdominal pain onset 2 weeks ago. Intermittent. "nagging" pain 2/10 in left lower quadrant without radiation. Seemed to worsen last night. Has also had diarrhea "looser" for 2 weeks on and off. Seems like after she eats certain foods they "go right through me" and she has diarrhea.  Last BM was this morning and it wasn't as loose.  No blood or melena in stool.  Was going to schedule an appointment with Dr Woody Seller PCP but he is out of the office for 1 week.  Notes she was told she has diverticulosis at her last colonoscopy 2020.  Is concerned because she has been eating more seedy fruits blackberries and a green grape like fruit with seeds.  Denies fever, chill, nausea, vomiting. Appetite is good. No blood or melena in stool. No urinary symptoms.  She finished nitrofurantoin 2 weeks ago for a UTI.  No abdominal surgeries. No known diverticulitis.  Recent EGD and colonoscopy by dr Gala Romney.  HPI     Past Medical History:  Diagnosis Date  . Arthritis   . Breast cancer ([place]) 02/19/13  . Chronic back pain    reason unknown  . Constipation    doesn't take any medss  . Diverticulosis   . Headache(784.0)    occasionally  . History of bladder infections   . History of blood transfusion    no abnormal reaction noted  . History of colon polyps   . Hx of radiation therapy 05/13/13- 06/16/13   right breast 5000 cGy in 25 sessions  . Hypercholesteremia    takes Crestor daily  . Hypertension    takes HCTZ daily  . Hypothyroid    takes Synthroid daily  . Insomnia    takes Ambien nightly as needed  . Neck pain 05/13/2012   HNP  . Neuropathy   . Vertigo    doesn't take any meds    Patient Active Problem List   Diagnosis Date Noted  . Rectal bleeding  04/01/2018  . Lymphedema 07/22/2014  . S/P cervical spinal fusion 03/26/2014  . Sprain of neck 07/10/2013  . Stiffness of joint, not elsewhere classified, other specified site 07/10/2013  . Breast cancer ([place])   . 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 Breast cancer of upper-outer quadrant of right female breast ([place]) 02/24/2013  . Unspecified constipation 01/22/2013  . Dyspepsia 01/22/2013  . Vertigo 05/13/2012  . Headache(784.0) 05/13/2012  . Neck pain 05/13/2012  . Menopausal symptoms   . Abscess 06/14/2011  . DYSPHAGIA UNSPECIFIED 09/14/2008  . MENOPAUSE-RELATED VASOMOTOR SYMPTOMS, HOT FLASHES 03/25/2007  . HYPERTHYROIDISM 10/22/2006  . HYPERLIPIDEMIA 10/22/2006  . HYPERTENSION 10/22/2006  . OVERACTIVE BLADDER 10/22/2006  . FIBROCYSTIC BREAST DISEASE 10/22/2006  . SWEATING 10/22/2006  . INSOMNIA, HX OF 10/22/2006    Past Surgical History:  Procedure Laterality Date  . ABDOMINAL HYSTERECTOMY  1983   partial  . ANTERIOR CERVICAL DECOMP/DISCECTOMY FUSION N/A 03/26/2014   Procedure: ANTERIOR CERVICAL DECOMPRESSION/DISCECTOMY FUSION 1 LEVEL;  Surgeon: Eustace Moore, MD;  Location: [place] NEURO ORS;  Service: Neurosurgery;  Laterality: N/A;  ANTERIOR CERVICAL DECOMPRESSION/DISCECTOMY FUSION 1 LEVEL CERVICAL 5-6  . BREAST LUMPECTOMY Right 04/03/2013  . BREAST LUMPECTOMY WITH NEEDLE LOCALIZATION AND AXILLARY SENTINEL  LYMPH NODE BX Right 04/03/2013   Procedure: BREAST LUMPECTOMY WITH NEEDLE LOCALIZATION AND AXILLARY SENTINEL LYMPH NODE BX;  Surgeon: Merrie Roof, MD;  Location: [facility];  Service: General;  Laterality: Right;  . CHOLECYSTECTOMY  1994  . COLONOSCOPY  09/17/2008   OXB:DZHG tortuous, but otherwise normal-appearing colon/. Scattered diverticula  . COLONOSCOPY N/A 05/31/2018   Procedure: COLONOSCOPY;  Surgeon: Daneil Dolin, MD;  [facility];  Service: Endoscopy;  Laterality: N/A;  10:45am  . ESOPHAGOGASTRODUODENOSCOPY  09/17/2008   RMR:Two mid  esophageal diverticula/Small benign cystic mucosal lesions distal esophagus of doubtful  clinical significance, stable for least 5 years/ Small hiatal hernia, otherwise normal stomach D1, D2  . ESOPHAGOGASTRODUODENOSCOPY    02/10/2003   DJM:EQASTM esophageal 3 cystic lesions without luminal compromise or evidence/Nonerosive antral gastritis/The esophagus was dilated by passing 56 [place] Maloney dilator.Marland Kitchen Epic notes states +H.pylori gastritis. treatment completed per epic notes.   . ESOPHAGOGASTRODUODENOSCOPY N/A 05/31/2018   Procedure: ESOPHAGOGASTRODUODENOSCOPY (EGD);  Surgeon: Daneil Dolin, MD;  [facility];  Service: Endoscopy;  Laterality: N/A;  . ESOPHAGOGASTRODUODENOSCOPY (EGD) WITH ESOPHAGEAL DILATION N/A 02/03/2013   HDQ:QIWLNL esophageal duplication cyst. Small esophageal diverticulum. Otherwise;  EGD normal - Widely patent tubular esophagus before and after dilation.  Status post passage of  a  Maloney dilator.  . EUS  Aug 2011   Dr. Newman Pies: EGD with multiple lower esophageal submucosal nodules, stomach and duodenum normal, EUS with esophageal duplication cysts  . MALONEY DILATION N/A 05/31/2018   Procedure: Venia Minks DILATION;  Surgeon: Daneil Dolin, MD;  [facility];  Service: Endoscopy;  Laterality: N/A;  . POLYPECTOMY  05/31/2018   Procedure: POLYPECTOMY;  Surgeon: Daneil Dolin, MD;  [facility];  Service: Endoscopy;;  colon  . THYROID SURGERY  2011     OB History    Gravida  4   Para  3   Term      Preterm      AB      Living        SAB      TAB      Ectopic      Multiple      Live Births              Family History  Problem Relation Age of Onset  . Lupus Sister   . Hypertension Brother   . Diabetes Brother   . Hypertension Brother   . Diabetes Brother   . Multiple sclerosis Mother   . Cancer Father        "stomach"  . Breast cancer Daughter 46  . Colon cancer Neg Hx     Social History   Tobacco Use  .  Smoking status: Never Smoker  . Smokeless tobacco: Never Used  Vaping Use  . Vaping Use: Never used  Substance Use Topics  . Alcohol use: No  . Drug use: No    Home Medications Prior to Admission medications   Medication Sig Start Date End Date Taking? Authorizing Provider  amLODipine (NORVASC) 2.5 MG tablet Take 2.5 mg by mouth daily. 04/18/18   [provider]  dicyclomine (BENTYL) 20 MG tablet Take 1 tablet (20 mg total) by mouth 3 (three) times daily before meals for 7 days. 10/28/19 11/04/19  Kinnie Feil, PA-C  fluticasone (FLONASE) 50 MCG/ACT nasal spray Place 2 sprays into both nostrils daily as needed for allergies. 04/30/18   [provider]  gabapentin (NEURONTIN) 100 MG capsule Take 100 mg by mouth 2 (two) times daily as needed (arm/shoulder pain.).     [provider]  hydrochlorothiazide (HYDRODIURIL) 25 MG tablet Take 25 mg by mouth daily.      [provider]  levothyroxine (SYNTHROID) 75 MCG tablet Take 75 mcg by mouth daily before breakfast.    [provider]  meloxicam (MOBIC) 15 MG tablet Take 15 mg by mouth daily as needed (back pain.).    [provider]  pantoprazole (PROTONIX) 40 MG tablet Take 1 tablet (40 mg total) by mouth daily. Patient taking differently: Take 40 mg by mouth daily as needed (heartburn/indigestion/reflux.).  12/29/17   Sherwood Gambler, MD  potassium chloride SA (K-DUR,KLOR-CON) 20 MEQ tablet Take 20 mEq by mouth daily.    [provider]  rosuvastatin (CRESTOR) 5 MG tablet Take 5 mg by mouth daily.     [provider]  zolpidem (AMBIEN) 5 MG tablet Take 2.5 mg by mouth at bedtime as needed for sleep.  01/21/14   [provider]    Allergies    Tramadol, Codeine, and Percocet [oxycodone-acetaminophen]  Review of Systems   Review of Systems  Gastrointestinal: Positive for abdominal pain and diarrhea.  All other systems reviewed and are negative.   Physical  Exam Updated Vital Signs BP 131/80 (BP Location: Left Arm)   Pulse 60   Temp 99.3 F (37.4 C) (Oral)   Resp 16   Ht 5\' 6"  (1.676 m)   Wt 79.4 kg   SpO2 98%   BMI 28.25 kg/m   Physical Exam Vitals and nursing note reviewed.  Constitutional:      Appearance: She is well-developed.     Comments: Non toxic in NAD  HENT:     Head: Normocephalic and atraumatic.     Nose: Nose normal.  Eyes:     Conjunctiva/sclera: Conjunctivae normal.  Cardiovascular:     Rate and Rhythm: Normal rate and regular rhythm.  Pulmonary:     Effort: Pulmonary effort is normal.     Breath sounds: Normal breath sounds.  Abdominal:     General: Bowel sounds are normal.     Palpations: Abdomen is soft.     Tenderness: There is abdominal tenderness in the left lower quadrant.     Comments: No G/R/R. No suprapubic or CVA tenderness. Negative Murphy's and McBurney's. Active BS to lower quadrants.   Musculoskeletal:        General: Normal range of motion.     Cervical back: Normal range of motion.  Skin:    General: Skin is warm and dry.     Capillary Refill: Capillary refill takes less than 2 seconds.  Neurological:     Mental Status: She is alert.  Psychiatric:        Behavior: Behavior normal.     ED Results / Procedures / Treatments   Labs (all labs ordered are listed, but only abnormal results are displayed) Labs Reviewed  COMPREHENSIVE METABOLIC PANEL - Abnormal; Notable for the following components:      Result Value   Potassium 3.3 (*)    All other components within normal limits  URINALYSIS, ROUTINE W REFLEX MICROSCOPIC - Abnormal; Notable for the following components:   Hgb urine dipstick SMALL (*)    All other components within normal limits  LIPASE, BLOOD  CBC    EKG None  Radiology CT ABDOMEN PELVIS W CONTRAST  Result Date: 10/28/2019 CLINICAL DATA:  Left lower  quadrant abdominal pain. Diarrhea for 2 weeks. EXAM: CT ABDOMEN AND PELVIS WITH CONTRAST TECHNIQUE: Multidetector  CT imaging of the abdomen and pelvis was performed using the standard protocol following bolus administration of intravenous contrast. CONTRAST:  121mL OMNIPAQUE IOHEXOL 300 MG/ML  SOLN COMPARISON:  CT 05/12/2018 FINDINGS: Lower chest: No acute findings. Small Bochdalek hernia on the right containing fat. Hepatobiliary: Decreased hepatic density typical of steatosis. No discrete focal lesion. Clips in the gallbladder fossa postcholecystectomy. No biliary dilatation. Pancreas: No ductal dilatation or inflammation. There is a tiny 3 mm cyst in the distal pancreatic body/tail, series 2, image 23. Spleen: Calcified granuloma.  Normal in size. Adrenals/Urinary Tract: Normal adrenal glands. No hydronephrosis or perinephric edema. Homogeneous renal enhancement with symmetric excretion on delayed phase imaging. Subcentimeter low-density lesion in the mid posterior right kidney is too small to characterize but likely cyst. Urinary bladder is partially distended without wall thickening. Stomach/Bowel: Nondistended stomach. There is no small bowel obstruction or inflammation. Terminal ileum is normal. Normal appendix, series 2, image 52. Liquid stool in the cecum and proximal colon. Remainder the colon is decompressed. Multiple colonic diverticula. No diverticulitis or colonic wall thickening. No pericolonic edema. Vascular/Lymphatic: Aortic atherosclerosis. No aortic aneurysm. Portal vein is patent. No abdominopelvic adenopathy. Reproductive: Hysterectomy.  Quiescent ovaries.  No adnexal mass. Other: No free air, free fluid, or intra-abdominal fluid collection. Musculoskeletal: There are no acute or suspicious osseous abnormalities. Transitional lumbosacral anatomy. IMPRESSION: 1. Liquid stool in the cecum and proximal colon, can be seen with diarrheal illness. No colonic wall thickening or inflammation. 2. Colonic diverticulosis without diverticulitis. 3. Hepatic steatosis. Aortic Atherosclerosis (ICD10-I70.0).  Electronically Signed   By: Keith Rake M.D.   On: 10/28/2019 18:25    Procedures Procedures (including critical care time)  Medications Ordered in ED Medications  dicyclomine (BENTYL) capsule 10 mg (10 mg Oral Given 10/28/19 1617)  iohexol (OMNIPAQUE) 300 MG/ML solution 100 mL (100 mLs Intravenous Contrast Given 10/28/19 1727)    ED Course  I have reviewed the triage vital signs and the nursing notes.  Pertinent labs & imaging results that were available during my care of the patient were reviewed by me and considered in my medical decision making (see chart for details).    MDM Rules/Calculators/A&P                          69 year old female presents for evaluation of left lower abdominal pain and intermittent loose stools for the last 2 weeks.    EMR, triage nursing notes reviewed to assist with MDM.  She has had a recent colonoscopy and EGD that showed diverticulosis.  States she recently finished nitrofurantoin for a UTI.  DDx includes diverticulitis, viral gastroenteritis, antibiotic induced diarrhea.  Your work-up initiated in triage including lab work, urinalysis.  This was personally visualized and interpreted.  Minimal hypokalemia K3.3.  Otherwise electrolytes, LFTs, creatinine, lipase, WBC all normal.  Urinalysis without convincing findings of a UTI.  Given focal left lower quadrant abdominal pain, age, diarrhea CT A/P obtained.  This was personally visualized and interpreted.  It shows liquid stool in the proximal colon but no active diverticulitis, perforation.   Patient given Bentyl.  She was reevaluated and feels better.  No vomiting or diarrhea here.  Discussed with patient symptoms likely related to recent antibiotic use or possibly a virus.  On further review of systems, she denies any Covid symptoms.  She is fully vaccinated.  She has no  white count, fever.  Her abdominal tenderness is very mild.  Will defer antibiotics for diarrhea at this time.  Recommended  Tylenol, Bentyl, probiotics, diet modifications.  Recommended PCP follow-up in 1 week.  Return precautions discussed. Final Clinical Impression(s) / ED Diagnoses Final diagnoses:  Antibiotic-associated diarrhea    Rx / DC Orders ED Discharge Orders         Ordered    dicyclomine (BENTYL) 20 MG tablet  3 times daily before meals        10/28/19 1903           Arlean Hopping 10/28/19 1921    Fredia Sorrow, MD 11/07/19 1600

## 2019-11-10 DIAGNOSIS — M75101 Unspecified rotator cuff tear or rupture of right shoulder, not specified as traumatic: Secondary | ICD-10-CM | POA: Diagnosis not present

## 2019-11-10 DIAGNOSIS — M7581 Other shoulder lesions, right shoulder: Secondary | ICD-10-CM | POA: Diagnosis not present

## 2019-11-10 DIAGNOSIS — M7551 Bursitis of right shoulder: Secondary | ICD-10-CM | POA: Diagnosis not present

## 2019-11-10 DIAGNOSIS — M19011 Primary osteoarthritis, right shoulder: Secondary | ICD-10-CM | POA: Diagnosis not present

## 2019-11-10 DIAGNOSIS — M25511 Pain in right shoulder: Secondary | ICD-10-CM | POA: Diagnosis not present

## 2019-11-25 DIAGNOSIS — M19011 Primary osteoarthritis, right shoulder: Secondary | ICD-10-CM | POA: Diagnosis not present

## 2019-11-25 DIAGNOSIS — Z23 Encounter for immunization: Secondary | ICD-10-CM | POA: Diagnosis not present

## 2019-11-25 DIAGNOSIS — M7581 Other shoulder lesions, right shoulder: Secondary | ICD-10-CM | POA: Diagnosis not present

## 2019-11-28 IMAGING — CT CT RENAL STONE PROTOCOL
2 of 4 series · 16 of 46 positions shown, 18 images · non-contrast
Comparison: CT scan of December 29, 2017.

CLINICAL DATA: Right flank pain.

EXAM:
CT ABDOMEN AND PELVIS WITHOUT CONTRAST
TECHNIQUE: Multidetector CT imaging of the abdomen and pelvis was performed
following the standard protocol without IV contrast.

[Series 3: ap without · axial · non-contrast · 0.70mm/px · z∈[+867,+1217]mm · 13 of 82 slices shown, 15 images]
[im 6/82  soft-tissue]
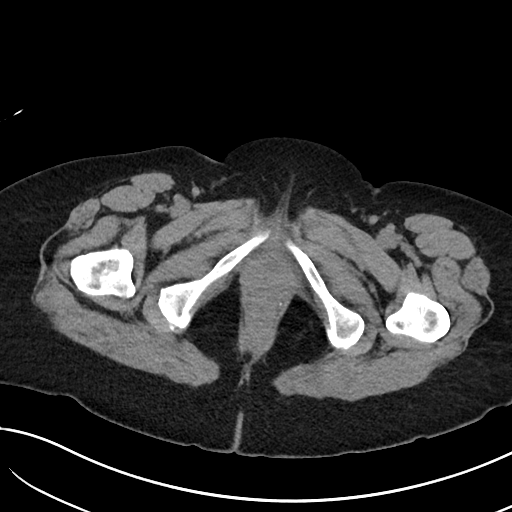
[im 6/82  bone]
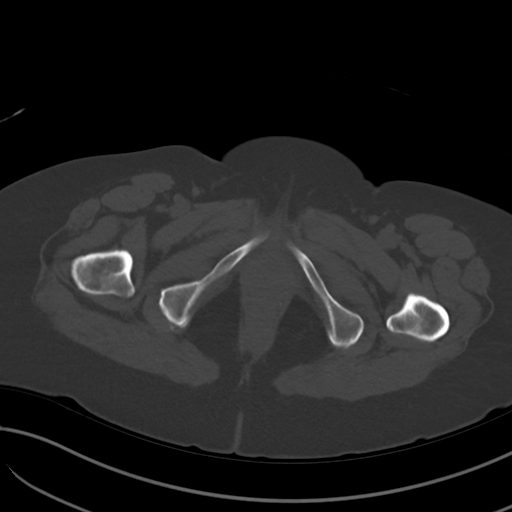
[im 11/82  soft-tissue]
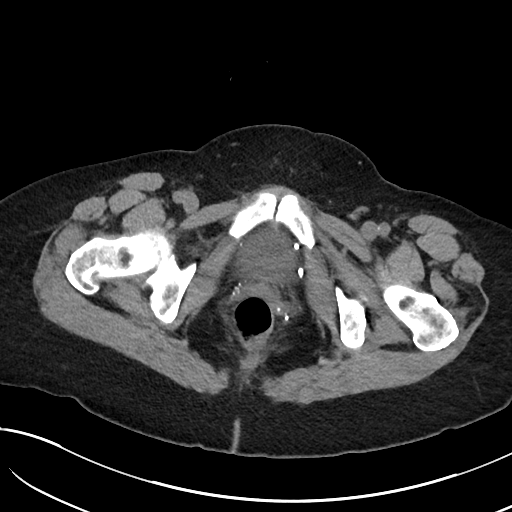
[im 16/82  soft-tissue]
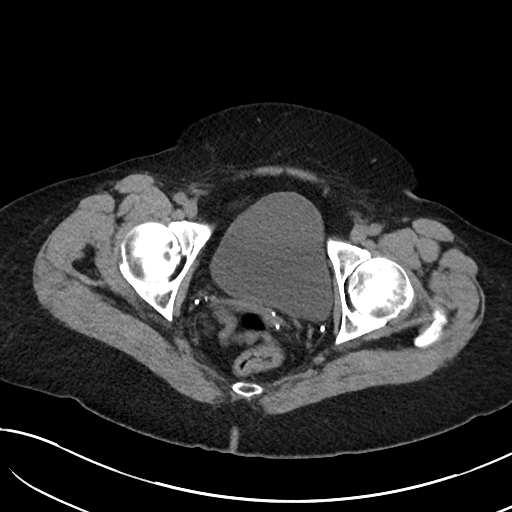
[im 26/82  soft-tissue]
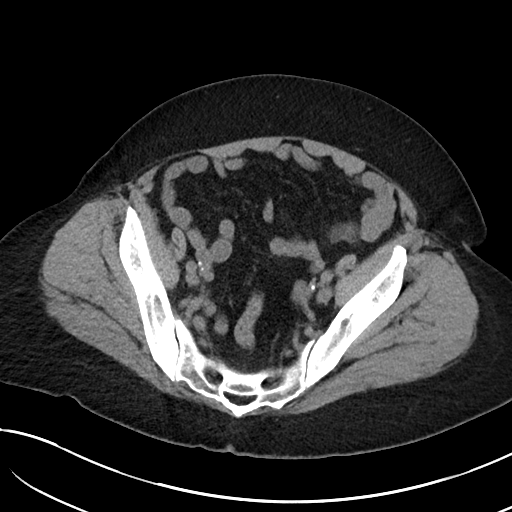
[im 31/82  soft-tissue]
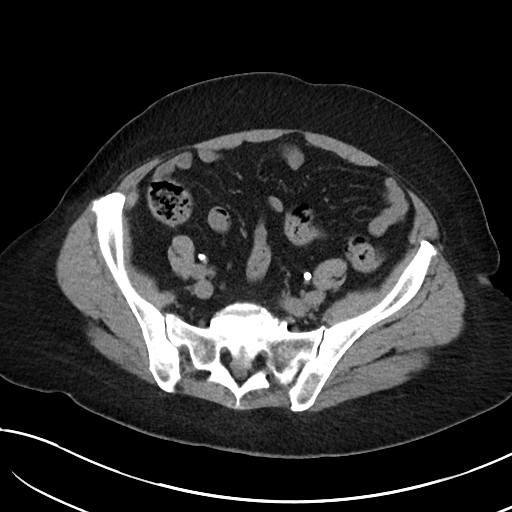
[im 36/82  soft-tissue]
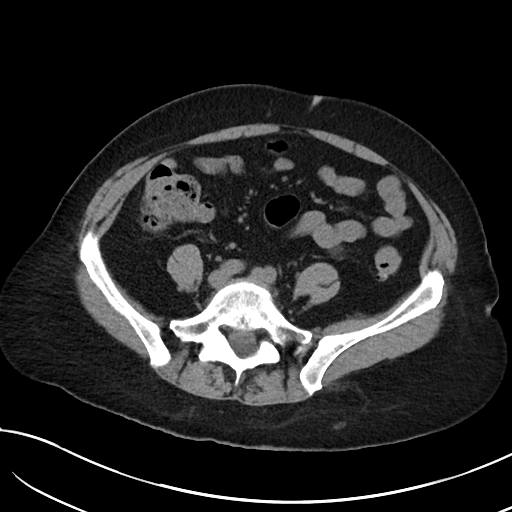
[im 41/82  soft-tissue]
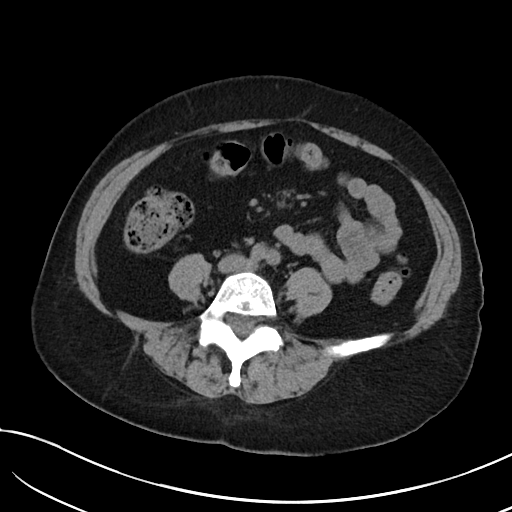
[im 46/82  soft-tissue]
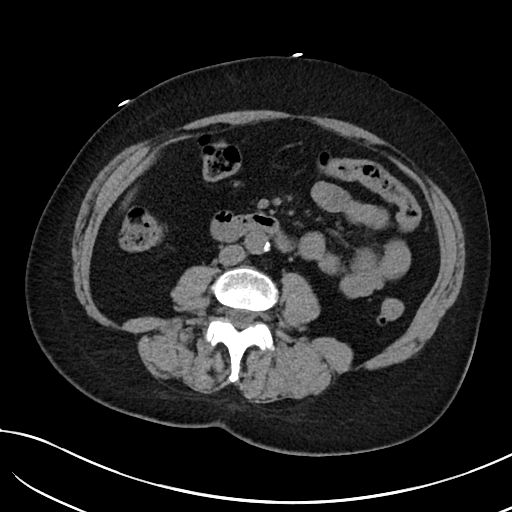
[im 51/82  soft-tissue]
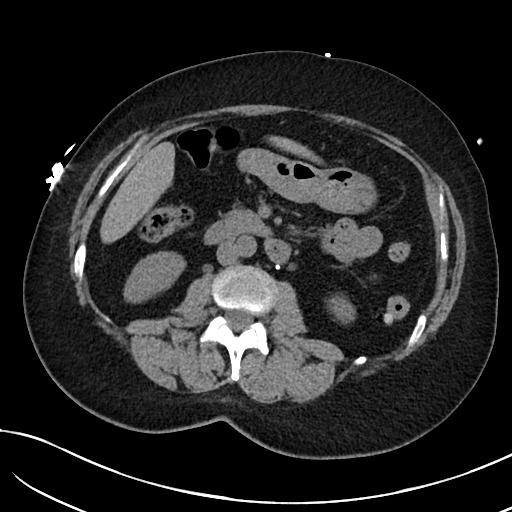
[im 51/82  bone]
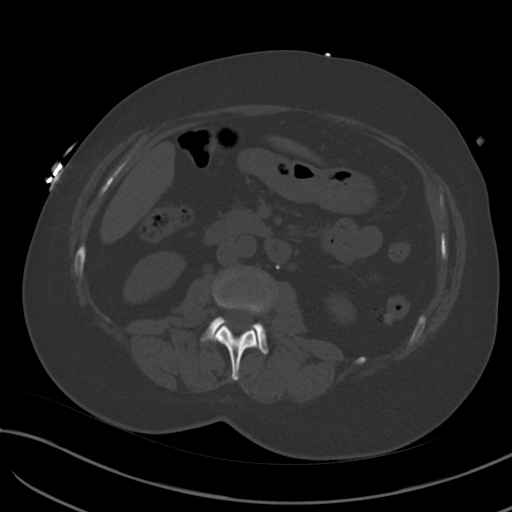
[im 56/82  soft-tissue]
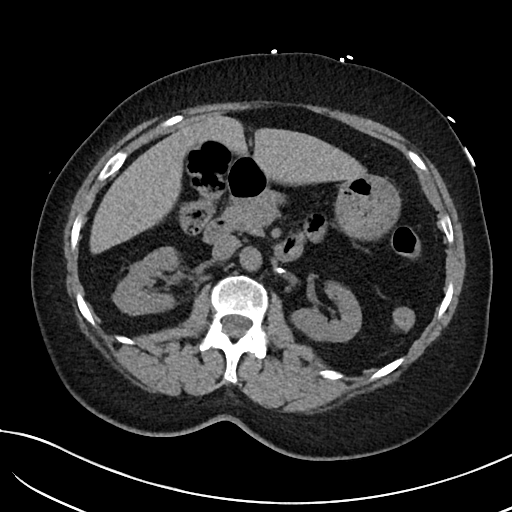
[im 66/82  soft-tissue]
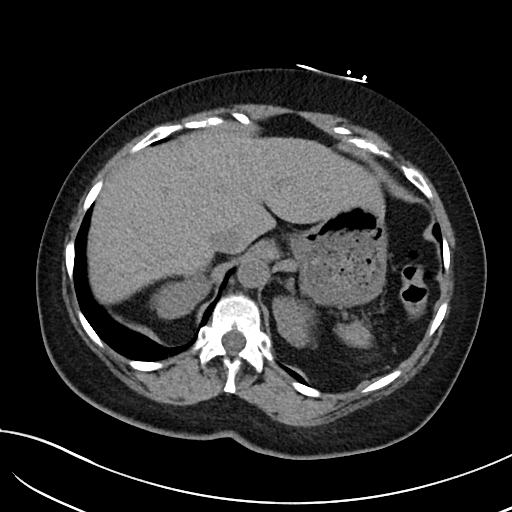
[im 71/82  soft-tissue]
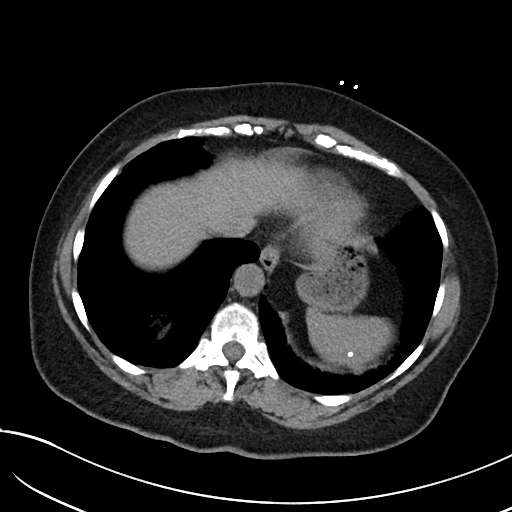
[im 76/82  soft-tissue]
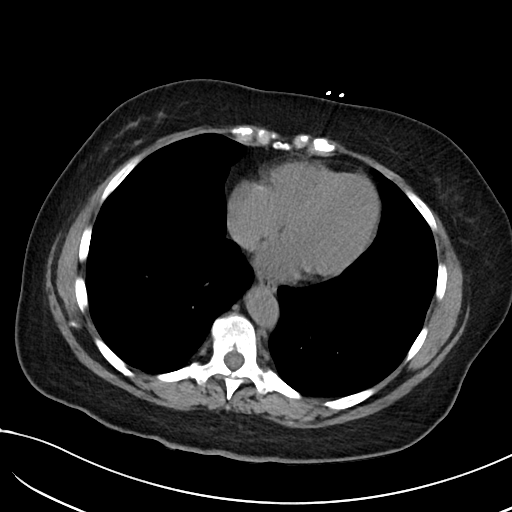

[Series 6: cor · coronal · 0.61mm/px · 3 of 81 slices shown]
[im 27/81  soft-tissue]
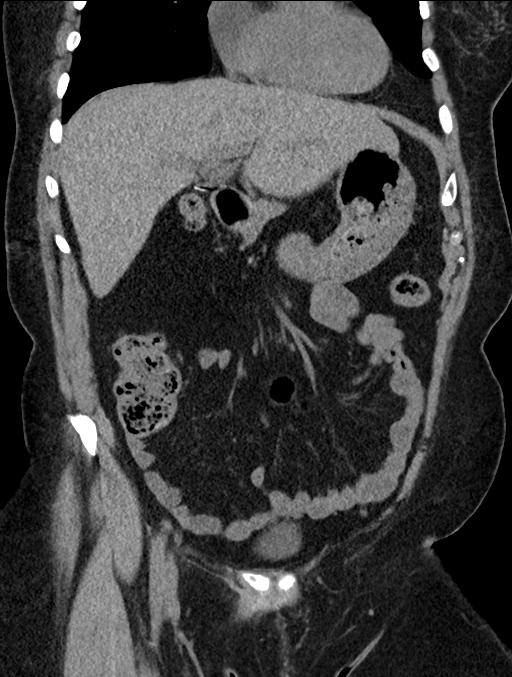
[im 36/81  soft-tissue]
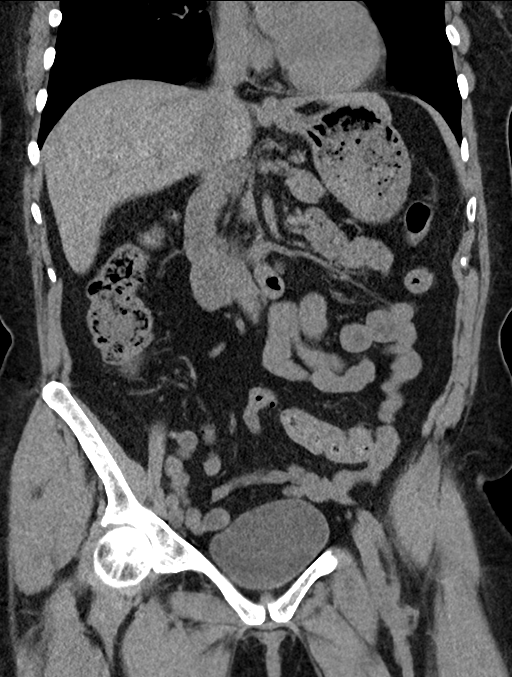
[im 45/81  soft-tissue]
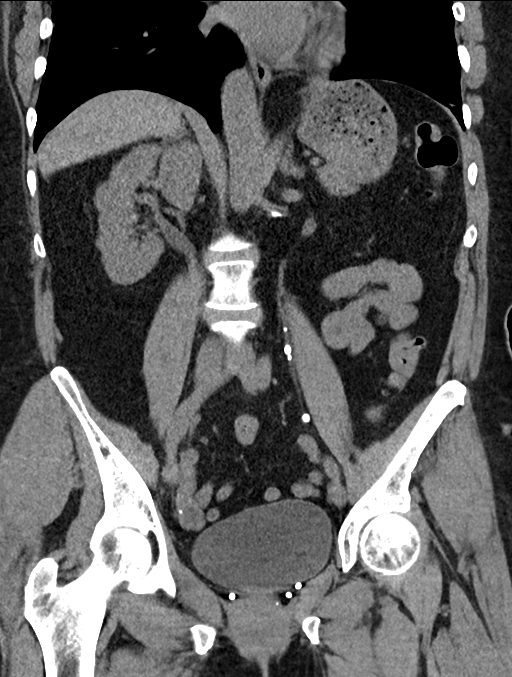

[16 of 46 positions shown; findings below may reference images not displayed]

FINDINGS: Lower chest: No acute abnormality.

Hepatobiliary: No focal liver abnormality is seen. Status post
cholecystectomy. No biliary dilatation.

Pancreas: Unremarkable. No pancreatic ductal dilatation or
surrounding inflammatory changes.

Spleen: Calcified splenic granulomata are noted.

Adrenals/Urinary Tract: Adrenal glands are unremarkable. Kidneys are
normal, without renal calculi, focal lesion, or hydronephrosis.
Bladder is unremarkable.

Stomach/Bowel: Stomach is within normal limits. Appendix appears
normal. No evidence of bowel wall thickening, distention, or
inflammatory changes. Diverticulosis of descending colon is noted
without inflammation.

Vascular/Lymphatic: No significant vascular findings are present. No
enlarged abdominal or pelvic lymph nodes.

Reproductive: Status post hysterectomy. No adnexal masses.

Other: No abdominal wall hernia or abnormality. No abdominopelvic
ascites.

Musculoskeletal: No acute or significant osseous findings.
IMPRESSION: No acute abnormality seen in the abdomen or pelvis.

## 2019-12-11 DIAGNOSIS — K219 Gastro-esophageal reflux disease without esophagitis: Secondary | ICD-10-CM | POA: Diagnosis not present

## 2019-12-11 DIAGNOSIS — I10 Essential (primary) hypertension: Secondary | ICD-10-CM | POA: Diagnosis not present

## 2019-12-17 DIAGNOSIS — E0500 Thyrotoxicosis with diffuse goiter without thyrotoxic crisis or storm: Secondary | ICD-10-CM | POA: Diagnosis not present

## 2019-12-17 DIAGNOSIS — E890 Postprocedural hypothyroidism: Secondary | ICD-10-CM | POA: Diagnosis not present

## 2020-01-01 DIAGNOSIS — I10 Essential (primary) hypertension: Secondary | ICD-10-CM | POA: Diagnosis not present

## 2020-01-01 DIAGNOSIS — E049 Nontoxic goiter, unspecified: Secondary | ICD-10-CM | POA: Diagnosis not present

## 2020-01-01 DIAGNOSIS — E7800 Pure hypercholesterolemia, unspecified: Secondary | ICD-10-CM | POA: Diagnosis not present

## 2020-01-01 DIAGNOSIS — C50419 Malignant neoplasm of upper-outer quadrant of unspecified female breast: Secondary | ICD-10-CM | POA: Diagnosis not present

## 2020-01-01 DIAGNOSIS — E890 Postprocedural hypothyroidism: Secondary | ICD-10-CM | POA: Diagnosis not present

## 2020-01-01 DIAGNOSIS — R600 Localized edema: Secondary | ICD-10-CM | POA: Diagnosis not present

## 2020-01-13 ENCOUNTER — Other Ambulatory Visit (HOSPITAL_COMMUNITY): Payer: Self-pay | Admitting: Internal Medicine

## 2020-01-13 ENCOUNTER — Ambulatory Visit (HOSPITAL_COMMUNITY)
Admission: RE | Admit: 2020-01-13 | Discharge: 2020-01-13 | Disposition: A | Discharge status: Home / Self Care | Payer: Medicare PPO | Source: Ambulatory Visit | Attending: Internal Medicine | Admitting: Internal Medicine

## 2020-01-13 ENCOUNTER — Other Ambulatory Visit: Payer: Self-pay

## 2020-01-13 DIAGNOSIS — S5002XA Contusion of left elbow, initial encounter: Secondary | ICD-10-CM | POA: Diagnosis not present

## 2020-01-13 DIAGNOSIS — M25522 Pain in left elbow: Secondary | ICD-10-CM

## 2020-01-13 DIAGNOSIS — M7989 Other specified soft tissue disorders: Secondary | ICD-10-CM | POA: Diagnosis not present

## 2020-01-13 DIAGNOSIS — S59902A Unspecified injury of left elbow, initial encounter: Secondary | ICD-10-CM | POA: Diagnosis not present

## 2020-02-25 ENCOUNTER — Other Ambulatory Visit: Payer: Self-pay | Admitting: Oncology

## 2020-02-25 DIAGNOSIS — Z0000 Encounter for general adult medical examination without abnormal findings: Secondary | ICD-10-CM

## 2020-03-04 DIAGNOSIS — C50411 Malignant neoplasm of upper-outer quadrant of right female breast: Secondary | ICD-10-CM | POA: Diagnosis not present

## 2020-03-04 DIAGNOSIS — Z923 Personal history of irradiation: Secondary | ICD-10-CM | POA: Diagnosis not present

## 2020-03-04 DIAGNOSIS — Z171 Estrogen receptor negative status [ER-]: Secondary | ICD-10-CM | POA: Diagnosis not present

## 2020-03-04 DIAGNOSIS — M25511 Pain in right shoulder: Secondary | ICD-10-CM | POA: Diagnosis not present

## 2020-03-04 DIAGNOSIS — M7551 Bursitis of right shoulder: Secondary | ICD-10-CM | POA: Diagnosis not present

## 2020-03-04 DIAGNOSIS — Z79899 Other long term (current) drug therapy: Secondary | ICD-10-CM | POA: Diagnosis not present

## 2020-03-04 DIAGNOSIS — M792 Neuralgia and neuritis, unspecified: Secondary | ICD-10-CM | POA: Diagnosis not present

## 2020-03-09 DIAGNOSIS — I10 Essential (primary) hypertension: Secondary | ICD-10-CM | POA: Diagnosis not present

## 2020-03-09 DIAGNOSIS — R609 Edema, unspecified: Secondary | ICD-10-CM | POA: Diagnosis not present

## 2020-04-06 DIAGNOSIS — I10 Essential (primary) hypertension: Secondary | ICD-10-CM | POA: Diagnosis not present

## 2020-04-06 DIAGNOSIS — M5416 Radiculopathy, lumbar region: Secondary | ICD-10-CM | POA: Diagnosis not present

## 2020-04-20 DIAGNOSIS — M5416 Radiculopathy, lumbar region: Secondary | ICD-10-CM | POA: Diagnosis not present

## 2020-05-05 DIAGNOSIS — M5126 Other intervertebral disc displacement, lumbar region: Secondary | ICD-10-CM | POA: Diagnosis not present

## 2020-05-05 DIAGNOSIS — M5416 Radiculopathy, lumbar region: Secondary | ICD-10-CM | POA: Diagnosis not present

## 2020-05-05 DIAGNOSIS — M47812 Spondylosis without myelopathy or radiculopathy, cervical region: Secondary | ICD-10-CM | POA: Diagnosis not present

## 2020-05-05 DIAGNOSIS — M5412 Radiculopathy, cervical region: Secondary | ICD-10-CM | POA: Diagnosis not present

## 2020-05-06 DIAGNOSIS — M542 Cervicalgia: Secondary | ICD-10-CM | POA: Diagnosis not present

## 2020-05-17 ENCOUNTER — Inpatient Hospital Stay: Admission: RE | Admit: 2020-05-17 | Payer: Medicare PPO | Source: Ambulatory Visit

## 2020-05-20 ENCOUNTER — Ambulatory Visit
Admission: RE | Admit: 2020-05-20 | Discharge: 2020-05-20 | Disposition: A | Discharge status: Home / Self Care | Payer: Medicare PPO | Source: Ambulatory Visit | Attending: Oncology | Admitting: Oncology

## 2020-05-20 ENCOUNTER — Other Ambulatory Visit: Payer: Self-pay

## 2020-05-20 DIAGNOSIS — Z1231 Encounter for screening mammogram for malignant neoplasm of breast: Secondary | ICD-10-CM | POA: Diagnosis not present

## 2020-05-20 DIAGNOSIS — Z0000 Encounter for general adult medical examination without abnormal findings: Secondary | ICD-10-CM

## 2020-06-02 DIAGNOSIS — E785 Hyperlipidemia, unspecified: Secondary | ICD-10-CM | POA: Diagnosis not present

## 2020-06-02 DIAGNOSIS — R7301 Impaired fasting glucose: Secondary | ICD-10-CM | POA: Diagnosis not present

## 2020-06-02 DIAGNOSIS — Z79899 Other long term (current) drug therapy: Secondary | ICD-10-CM | POA: Diagnosis not present

## 2020-06-02 DIAGNOSIS — I10 Essential (primary) hypertension: Secondary | ICD-10-CM | POA: Diagnosis not present

## 2020-06-09 DIAGNOSIS — E785 Hyperlipidemia, unspecified: Secondary | ICD-10-CM | POA: Diagnosis not present

## 2020-06-09 DIAGNOSIS — I10 Essential (primary) hypertension: Secondary | ICD-10-CM | POA: Diagnosis not present

## 2020-07-08 DIAGNOSIS — R600 Localized edema: Secondary | ICD-10-CM | POA: Diagnosis not present

## 2020-07-08 DIAGNOSIS — E0580 Other thyrotoxicosis without thyrotoxic crisis or storm: Secondary | ICD-10-CM | POA: Diagnosis not present

## 2020-07-08 DIAGNOSIS — E7800 Pure hypercholesterolemia, unspecified: Secondary | ICD-10-CM | POA: Diagnosis not present

## 2020-07-08 DIAGNOSIS — E049 Nontoxic goiter, unspecified: Secondary | ICD-10-CM | POA: Diagnosis not present

## 2020-07-08 DIAGNOSIS — E890 Postprocedural hypothyroidism: Secondary | ICD-10-CM | POA: Diagnosis not present

## 2020-07-15 DIAGNOSIS — E7800 Pure hypercholesterolemia, unspecified: Secondary | ICD-10-CM | POA: Diagnosis not present

## 2020-07-15 DIAGNOSIS — Z7189 Other specified counseling: Secondary | ICD-10-CM | POA: Diagnosis not present

## 2020-07-15 DIAGNOSIS — E049 Nontoxic goiter, unspecified: Secondary | ICD-10-CM | POA: Diagnosis not present

## 2020-07-15 DIAGNOSIS — E0500 Thyrotoxicosis with diffuse goiter without thyrotoxic crisis or storm: Secondary | ICD-10-CM | POA: Diagnosis not present

## 2020-07-15 DIAGNOSIS — I10 Essential (primary) hypertension: Secondary | ICD-10-CM | POA: Diagnosis not present

## 2020-07-15 DIAGNOSIS — E890 Postprocedural hypothyroidism: Secondary | ICD-10-CM | POA: Diagnosis not present

## 2020-07-15 DIAGNOSIS — R600 Localized edema: Secondary | ICD-10-CM | POA: Diagnosis not present

## 2020-07-15 DIAGNOSIS — C50419 Malignant neoplasm of upper-outer quadrant of unspecified female breast: Secondary | ICD-10-CM | POA: Diagnosis not present

## 2020-10-11 DIAGNOSIS — I10 Essential (primary) hypertension: Secondary | ICD-10-CM | POA: Diagnosis not present

## 2020-11-16 DIAGNOSIS — Z23 Encounter for immunization: Secondary | ICD-10-CM | POA: Diagnosis not present

## 2020-11-29 IMAGING — MG DIGITAL SCREENING BILAT W/ TOMO W/ CAD
6 of 10 series · 6 of 30 positions shown · non-contrast
Comparison: Previous exam(s).

CLINICAL DATA: Screening. History of treated right breast cancer,
status post lumpectomy in 4458.

EXAM:
DIGITAL SCREENING BILATERAL MAMMOGRAM WITH TOMO AND CAD

[L CC synth-2D]
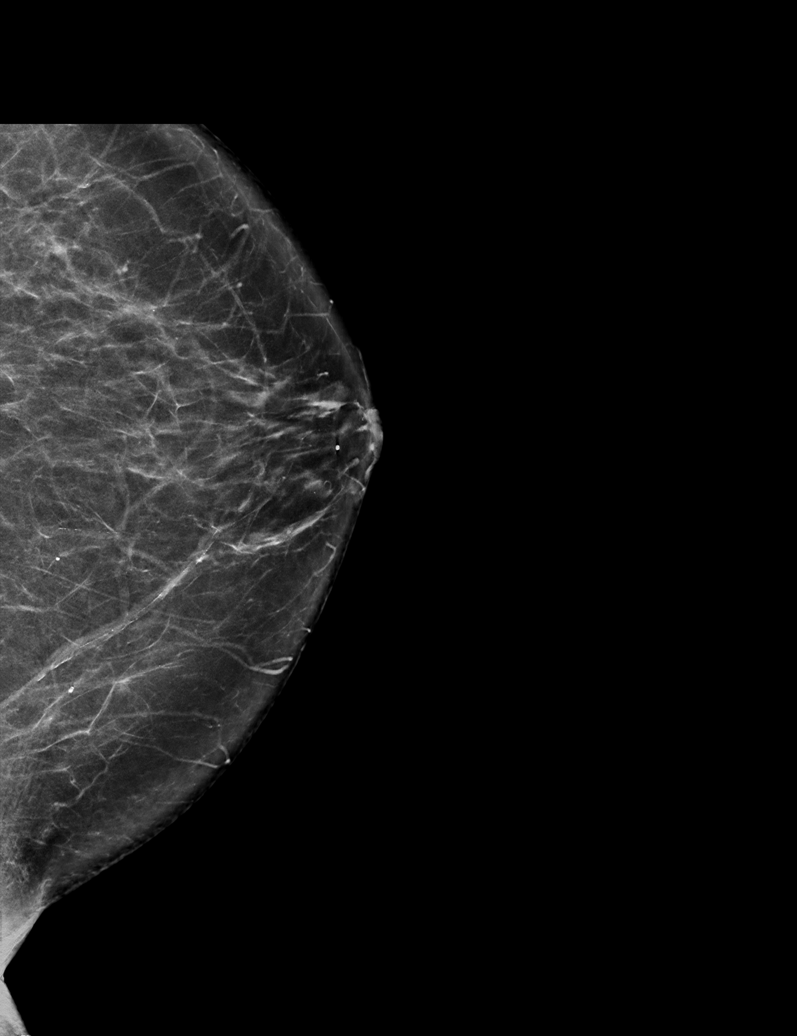

[L MLO synth-2D (1 of 2)]
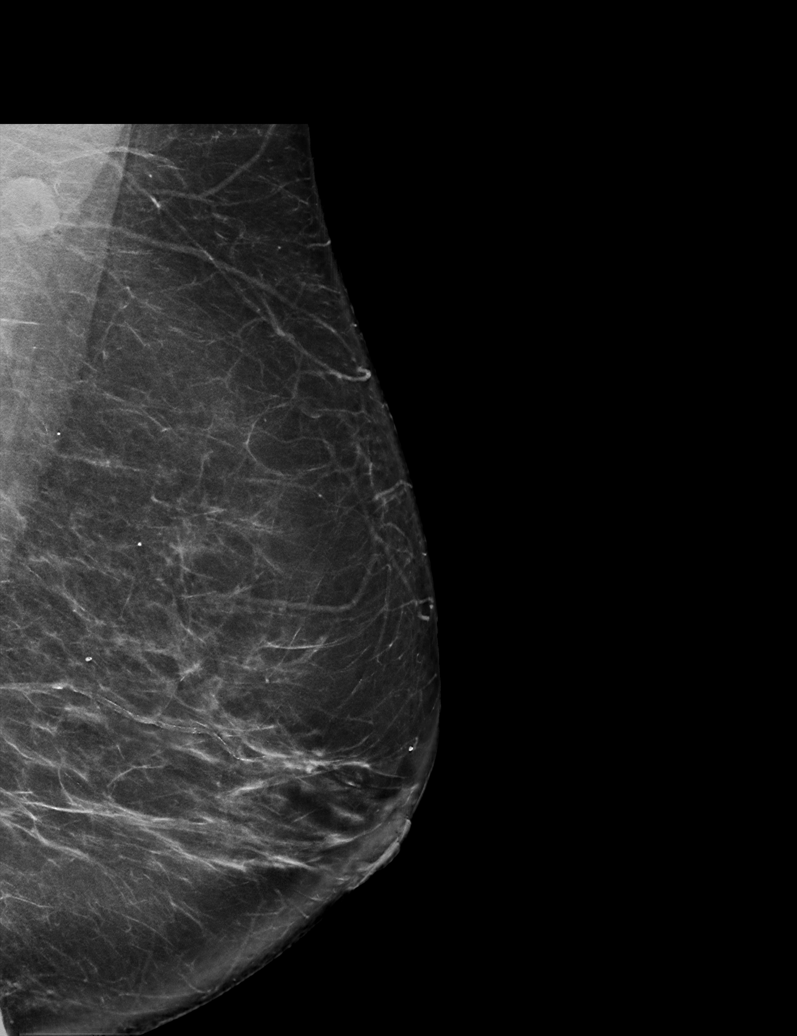

[R MLO synth-2D]
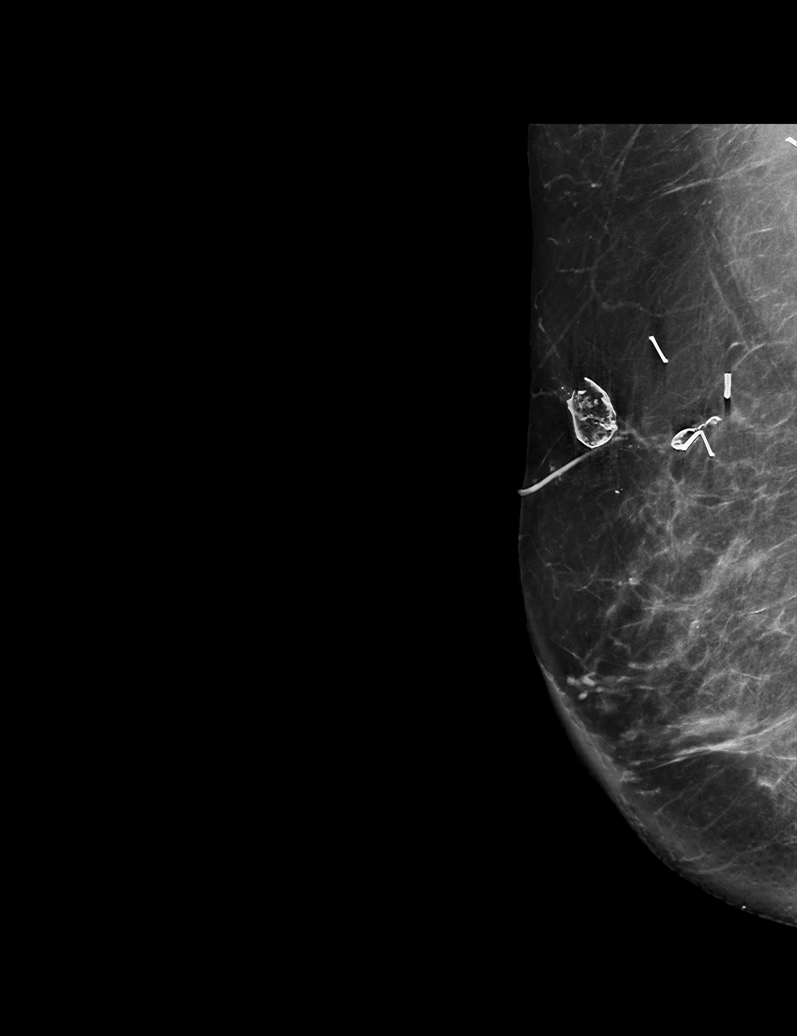

[R CC synth-2D]
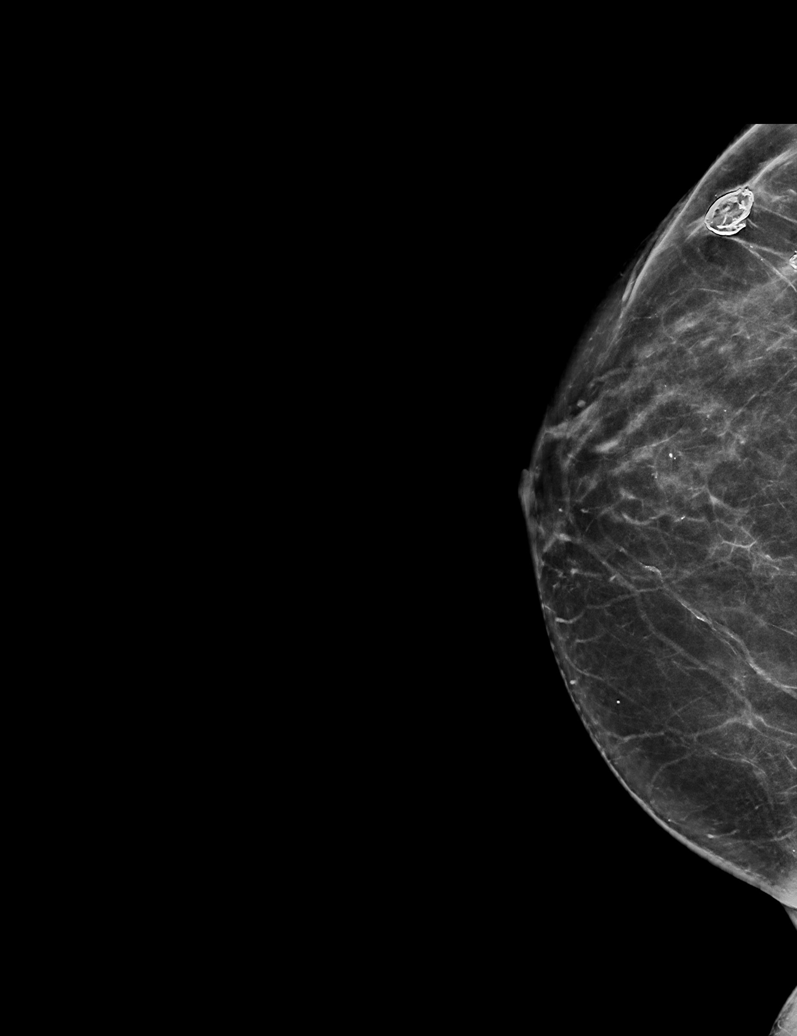

[L MLO synth-2D (2 of 2)]
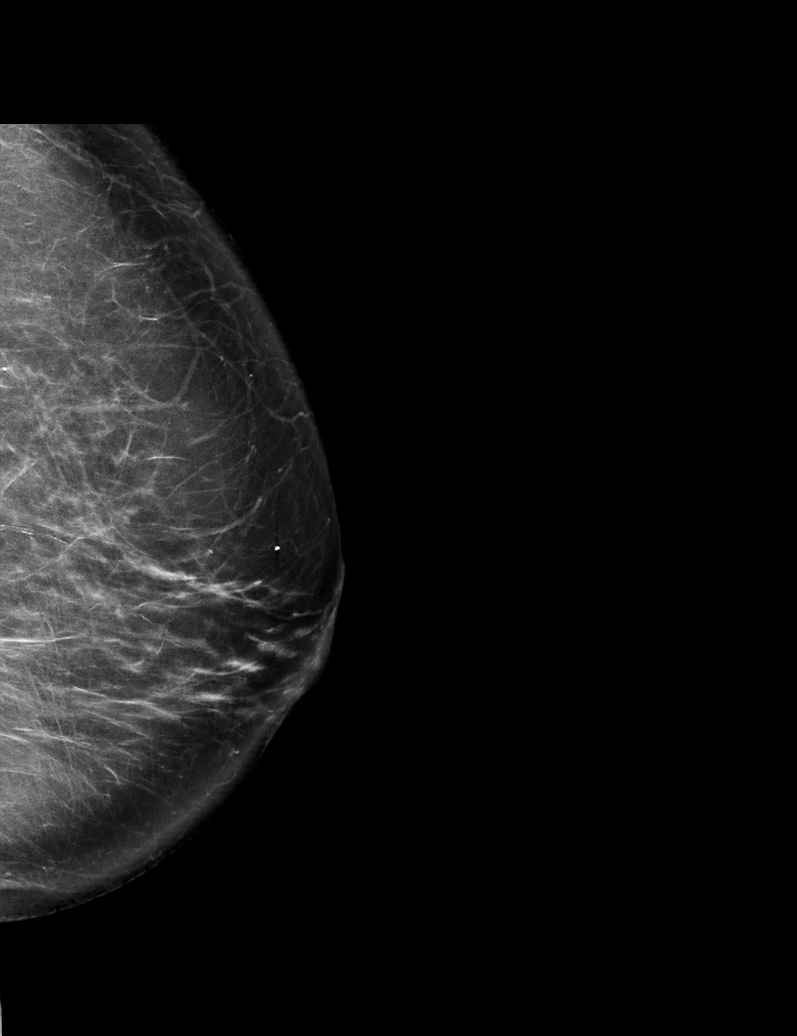

[L MLO tomo · tomo slice 39/77.0]
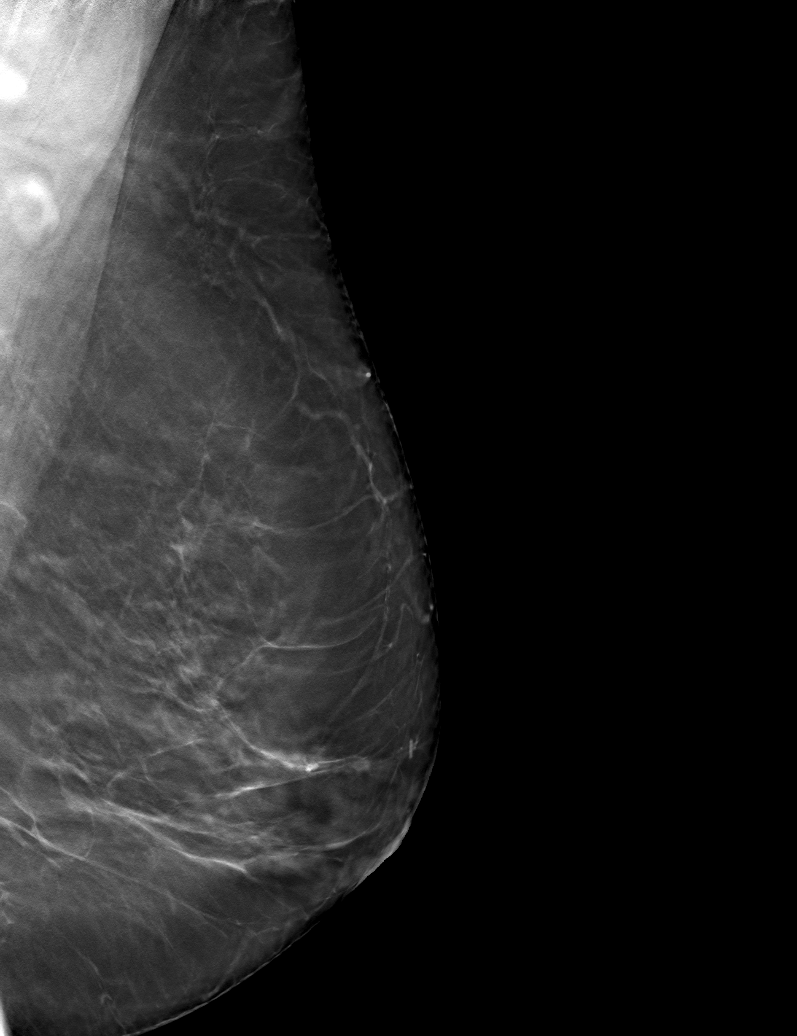

[6 of 30 positions shown; findings below may reference images not displayed]

ACR Breast Density Category b: There are scattered areas of
fibroglandular density.
FINDINGS: There are no findings suspicious for malignancy. Stable postsurgical
changes in the right breast. Images were processed with CAD.
IMPRESSION: No mammographic evidence of malignancy. A result letter of this
screening mammogram will be mailed directly to the patient.

RECOMMENDATION:
Screening mammogram in one year. (Code:5S-R-ZBO)

BI-RADS CATEGORY  1: Negative.

## 2020-12-29 DIAGNOSIS — E785 Hyperlipidemia, unspecified: Secondary | ICD-10-CM | POA: Diagnosis not present

## 2020-12-29 DIAGNOSIS — N951 Menopausal and female climacteric states: Secondary | ICD-10-CM | POA: Diagnosis not present

## 2020-12-29 DIAGNOSIS — E0500 Thyrotoxicosis with diffuse goiter without thyrotoxic crisis or storm: Secondary | ICD-10-CM | POA: Diagnosis not present

## 2020-12-29 DIAGNOSIS — I10 Essential (primary) hypertension: Secondary | ICD-10-CM | POA: Diagnosis not present

## 2020-12-29 DIAGNOSIS — E890 Postprocedural hypothyroidism: Secondary | ICD-10-CM | POA: Diagnosis not present

## 2020-12-29 DIAGNOSIS — E7800 Pure hypercholesterolemia, unspecified: Secondary | ICD-10-CM | POA: Diagnosis not present

## 2020-12-29 DIAGNOSIS — C50419 Malignant neoplasm of upper-outer quadrant of unspecified female breast: Secondary | ICD-10-CM | POA: Diagnosis not present

## 2020-12-29 DIAGNOSIS — R600 Localized edema: Secondary | ICD-10-CM | POA: Diagnosis not present

## 2021-01-10 DIAGNOSIS — I10 Essential (primary) hypertension: Secondary | ICD-10-CM | POA: Diagnosis not present

## 2021-01-10 DIAGNOSIS — E049 Nontoxic goiter, unspecified: Secondary | ICD-10-CM | POA: Diagnosis not present

## 2021-01-10 DIAGNOSIS — C50419 Malignant neoplasm of upper-outer quadrant of unspecified female breast: Secondary | ICD-10-CM | POA: Diagnosis not present

## 2021-01-10 DIAGNOSIS — E7800 Pure hypercholesterolemia, unspecified: Secondary | ICD-10-CM | POA: Diagnosis not present

## 2021-01-10 DIAGNOSIS — R600 Localized edema: Secondary | ICD-10-CM | POA: Diagnosis not present

## 2021-01-10 DIAGNOSIS — E890 Postprocedural hypothyroidism: Secondary | ICD-10-CM | POA: Diagnosis not present

## 2021-02-10 DIAGNOSIS — I10 Essential (primary) hypertension: Secondary | ICD-10-CM | POA: Diagnosis not present

## 2021-02-10 DIAGNOSIS — M79671 Pain in right foot: Secondary | ICD-10-CM | POA: Diagnosis not present

## 2021-02-16 ENCOUNTER — Ambulatory Visit (INDEPENDENT_AMBULATORY_CARE_PROVIDER_SITE_OTHER): Payer: Medicare PPO

## 2021-02-16 ENCOUNTER — Other Ambulatory Visit: Payer: Self-pay

## 2021-02-16 ENCOUNTER — Ambulatory Visit: Payer: Medicare PPO | Admitting: Podiatry

## 2021-02-16 DIAGNOSIS — M722 Plantar fascial fibromatosis: Secondary | ICD-10-CM

## 2021-02-16 DIAGNOSIS — M79672 Pain in left foot: Secondary | ICD-10-CM

## 2021-02-16 NOTE — Progress Notes (Signed)
Subjective:  Patient ID: [name], female    DOB: 05-22-1950,  MRN: [id number]  Chief Complaint  Patient presents with   Plantar Fasciitis    Left foot plantar fasciitis pain     71 y.o. female presents with the above complaint.  Patient presents with complaint of left heel pain that has been going for quite some time.  It started 3 years ago notable with injection.  It came back few months ago.  It is throbbing mostly when standing and being on his foot for a while.  Pain scale is 5 out of 10 warm soaks help.  She has not seen anyone else prior to seeing me.  She denies any other acute complaints.   Review of Systems: Negative except as noted in the HPI. Denies N/V/F/Ch.  Past Medical History:  Diagnosis Date   Arthritis    Breast cancer ([place]) 02/19/13   Chronic back pain    reason unknown   Constipation    doesn't take any medss   Diverticulosis    Headache(784.0)    occasionally   History of bladder infections    History of blood transfusion    no abnormal reaction noted   History of colon polyps    Hx of radiation therapy 05/13/13- 06/16/13   right breast 5000 cGy in 25 sessions   Hypercholesteremia    takes Crestor daily   Hypertension    takes HCTZ daily   Hypothyroid    takes Synthroid daily   Insomnia    takes Ambien nightly as needed   Neck pain 05/13/2012   HNP   Neuropathy    Vertigo    doesn't take any meds    Current Outpatient Medications:    amLODipine (NORVASC) 2.5 MG tablet, Take 2.5 mg by mouth daily., Disp: , Rfl:    dicyclomine (BENTYL) 20 MG tablet, Take 1 tablet (20 mg total) by mouth 3 (three) times daily before meals for 7 days., Disp: 21 tablet, Rfl: 0   fluticasone (FLONASE) 50 MCG/ACT nasal spray, Place 2 sprays into both nostrils daily as needed for allergies., Disp: , Rfl:    gabapentin (NEURONTIN) 100 MG capsule, Take 100 mg by mouth 2 (two) times daily as needed (arm/shoulder pain.). , Disp: , Rfl:    hydrochlorothiazide  (HYDRODIURIL) 25 MG tablet, Take 25 mg by mouth daily.  , Disp: , Rfl:    levothyroxine (SYNTHROID) 75 MCG tablet, Take 75 mcg by mouth daily before breakfast., Disp: , Rfl:    meloxicam (MOBIC) 15 MG tablet, Take 15 mg by mouth daily as needed (back pain.)., Disp: , Rfl:    pantoprazole (PROTONIX) 40 MG tablet, Take 1 tablet (40 mg total) by mouth daily. (Patient taking differently: Take 40 mg by mouth daily as needed (heartburn/indigestion/reflux.). ), Disp: 14 tablet, Rfl: 0   potassium chloride SA (K-DUR,KLOR-CON) 20 MEQ tablet, Take 20 mEq by mouth daily., Disp: , Rfl:    rosuvastatin (CRESTOR) 5 MG tablet, Take 5 mg by mouth daily. , Disp: , Rfl:    zolpidem (AMBIEN) 5 MG tablet, Take 2.5 mg by mouth at bedtime as needed for sleep. , Disp: , Rfl: 3  Social History   Tobacco Use  Smoking Status Never  Smokeless Tobacco Never    Allergies  Allergen Reactions   Tramadol Nausea Only   Codeine Nausea And Vomiting   Percocet [Oxycodone-Acetaminophen] Rash   Objective:  There were no vitals filed for this visit. There is no height or  weight on file to calculate BMI. Constitutional Well developed. Well nourished.  Vascular Dorsalis pedis pulses palpable bilaterally. Posterior tibial pulses palpable bilaterally. Capillary refill normal to all digits.  No cyanosis or clubbing noted. Pedal hair growth normal.  Neurologic Normal speech. Oriented to person, place, and time. Epicritic sensation to light touch grossly present bilaterally.  Dermatologic Nails well groomed and normal in appearance. No open wounds. No skin lesions.  Orthopedic: Normal joint ROM without pain or crepitus bilaterally. No visible deformities. Tender to palpation at the calcaneal tuber left. No pain with calcaneal squeeze left. Ankle ROM diminished range of motion left. Silfverskiold Test: positive left.   Radiographs: Taken and reviewed. No acute fractures or dislocations. No evidence of stress fracture.   Plantar heel spur present. Posterior heel spur present.   Assessment:   1. Plantar fasciitis of left foot    Plan:  Patient was evaluated and treated and all questions answered.  Plantar Fasciitis, left - XR reviewed as above.  - Educated on icing and stretching. Instructions given.  - Injection delivered to the plantar fascia as below. - DME: Plantar fascial brace dispensed to support the medial longitudinal arch of the foot and offload pressure from the heel and prevent arch collapse during weightbearing - Pharmacologic management: None  Procedure: Injection Tendon/Ligament Location: Left plantar fascia at the glabrous junction; medial approach. Skin Prep: alcohol Injectate: 0.5 cc 0.5% marcaine plain, 0.5 cc of 1% Lidocaine, 0.5 cc kenalog 10. Disposition: Patient tolerated procedure well. Injection site dressed with a band-aid.  No follow-ups on file.

## 2021-03-18 ENCOUNTER — Ambulatory Visit: Payer: Medicare PPO | Admitting: Podiatry

## 2021-03-22 ENCOUNTER — Other Ambulatory Visit: Payer: Self-pay | Admitting: Oncology

## 2021-03-22 DIAGNOSIS — Z1231 Encounter for screening mammogram for malignant neoplasm of breast: Secondary | ICD-10-CM

## 2021-03-30 ENCOUNTER — Ambulatory Visit: Payer: Medicare PPO | Admitting: Podiatry

## 2021-03-30 ENCOUNTER — Other Ambulatory Visit: Payer: Self-pay

## 2021-03-30 DIAGNOSIS — M7732 Calcaneal spur, left foot: Secondary | ICD-10-CM

## 2021-03-30 DIAGNOSIS — M722 Plantar fascial fibromatosis: Secondary | ICD-10-CM

## 2021-03-30 NOTE — Progress Notes (Signed)
?Subjective:  ?Patient ID: [name], female    DOB: [birth year],  MRN: [id number] ? ?Chief Complaint  ?Patient presents with  ? Plantar Fasciitis  ?  Pt stated that she still has some pain   ? ? ?71 y.o. female presents with the above complaint.  Patient presents with follow-up to left Planter fasciitis.  She states she is doing a little bit better.  The injection helped.  She is able to walk now.  She would like to discuss next treatment plans.  Pain scale 5 out of 10. ? ? ?Review of Systems: Negative except as noted in the HPI. Denies N/V/F/Ch. ? ?Past Medical History:  ?Diagnosis Date  ? Arthritis   ? Breast cancer ([place]) 02/19/13  ? Chronic back pain   ? reason unknown  ? Constipation   ? doesn't take any medss  ? Diverticulosis   ? Headache(784.0)   ? occasionally  ? History of bladder infections   ? History of blood transfusion   ? no abnormal reaction noted  ? History of colon polyps   ? Hx of radiation therapy 05/13/13- 06/16/13  ? right breast 5000 cGy in 25 sessions  ? Hypercholesteremia   ? takes Crestor daily  ? Hypertension   ? takes HCTZ daily  ? Hypothyroid   ? takes Synthroid daily  ? Insomnia   ? takes Ambien nightly as needed  ? Neck pain 05/13/2012  ? HNP  ? Neuropathy   ? Vertigo   ? doesn't take any meds  ? ? ?Current Outpatient Medications:  ?  amLODipine (NORVASC) 2.5 MG tablet, Take 2.5 mg by mouth daily., Disp: , Rfl:  ?  dicyclomine (BENTYL) 20 MG tablet, Take 1 tablet (20 mg total) by mouth 3 (three) times daily before meals for 7 days., Disp: 21 tablet, Rfl: 0 ?  fluticasone (FLONASE) 50 MCG/ACT nasal spray, Place 2 sprays into both nostrils daily as needed for allergies., Disp: , Rfl:  ?  gabapentin (NEURONTIN) 100 MG capsule, Take 100 mg by mouth 2 (two) times daily as needed (arm/shoulder pain.). , Disp: , Rfl:  ?  hydrochlorothiazide (HYDRODIURIL) 25 MG tablet, Take 25 mg by mouth daily.  , Disp: , Rfl:  ?  levothyroxine (SYNTHROID) 75 MCG tablet, Take 75 mcg by mouth daily before  breakfast., Disp: , Rfl:  ?  meloxicam (MOBIC) 15 MG tablet, Take 15 mg by mouth daily as needed (back pain.)., Disp: , Rfl:  ?  pantoprazole (PROTONIX) 40 MG tablet, Take 1 tablet (40 mg total) by mouth daily. (Patient taking differently: Take 40 mg by mouth daily as needed (heartburn/indigestion/reflux.). ), Disp: 14 tablet, Rfl: 0 ?  potassium chloride SA (K-DUR,KLOR-CON) 20 MEQ tablet, Take 20 mEq by mouth daily., Disp: , Rfl:  ?  rosuvastatin (CRESTOR) 5 MG tablet, Take 5 mg by mouth daily. , Disp: , Rfl:  ?  zolpidem (AMBIEN) 5 MG tablet, Take 2.5 mg by mouth at bedtime as needed for sleep. , Disp: , Rfl: 3 ? ?Social History  ? ?Tobacco Use  ?Smoking Status Never  ?Smokeless Tobacco Never  ? ? ?Allergies  ?Allergen Reactions  ? Tramadol Nausea Only  ? Codeine Nausea And Vomiting  ? Percocet [Oxycodone-Acetaminophen] Rash  ? ?Objective:  ?There were no vitals filed for this visit. ?There is no height or weight on file to calculate BMI. ?Constitutional Well developed. ?Well nourished.  ?Vascular Dorsalis pedis pulses palpable bilaterally. ?Posterior tibial pulses palpable bilaterally. ?Capillary refill normal to all  digits.  ?No cyanosis or clubbing noted. ?Pedal hair growth normal.  ?Neurologic Normal speech. ?Oriented to person, place, and time. ?Epicritic sensation to light touch grossly present bilaterally.  ?Dermatologic Nails well groomed and normal in appearance. ?No open wounds. ?No skin lesions.  ?Orthopedic: Normal joint ROM without pain or crepitus bilaterally. ?No visible deformities. ?Tender to palpation at the calcaneal tuber left. ?No pain with calcaneal squeeze left. ?Ankle ROM diminished range of motion left. ?Silfverskiold Test: positive left.  ? ?Radiographs: Taken and reviewed. No acute fractures or dislocations. No evidence of stress fracture.  Plantar heel spur present. Posterior heel spur present.  ? ?Assessment:  ? ?1. Heel spur, left   ?2. Plantar fasciitis of left foot   ? ? ?Plan:   ?Patient was evaluated and treated and all questions answered. ? ?Plantar Fasciitis, left with underlying heel spur ?- XR reviewed as above.  ?- Educated on icing and stretching. Instructions given.  ?-Second Injection delivered to the plantar fascia as below. ?- DME: Plantar fascial brace dispensed to support the medial longitudinal arch of the foot and offload pressure from the heel and prevent arch collapse during weightbearing ?- Pharmacologic management: None ?-Power steps are recommended.  Patient states that she will obtain it ? ?Procedure: Injection Tendon/Ligament ?Location: Left plantar fascia at the glabrous junction; medial approach. ?Skin Prep: alcohol ?Injectate: 0.5 cc 0.5% marcaine plain, 0.5 cc of 1% Lidocaine, 0.5 cc kenalog 10. ?Disposition: Patient tolerated procedure well. Injection site dressed with a band-aid. ? ?No follow-ups on file. ?

## 2021-04-01 DIAGNOSIS — I10 Essential (primary) hypertension: Secondary | ICD-10-CM | POA: Diagnosis not present

## 2021-04-26 DIAGNOSIS — Z86000 Personal history of in-situ neoplasm of breast: Secondary | ICD-10-CM | POA: Diagnosis not present

## 2021-04-26 DIAGNOSIS — D0511 Intraductal carcinoma in situ of right breast: Secondary | ICD-10-CM | POA: Diagnosis not present

## 2021-04-26 DIAGNOSIS — Z923 Personal history of irradiation: Secondary | ICD-10-CM | POA: Diagnosis not present

## 2021-04-26 DIAGNOSIS — Z09 Encounter for follow-up examination after completed treatment for conditions other than malignant neoplasm: Secondary | ICD-10-CM | POA: Diagnosis not present

## 2021-04-26 DIAGNOSIS — G629 Polyneuropathy, unspecified: Secondary | ICD-10-CM | POA: Diagnosis not present

## 2021-04-26 DIAGNOSIS — Z170 Estrogen receptor positive status [ER+]: Secondary | ICD-10-CM | POA: Diagnosis not present

## 2021-04-26 DIAGNOSIS — M25511 Pain in right shoulder: Secondary | ICD-10-CM | POA: Diagnosis not present

## 2021-04-27 ENCOUNTER — Ambulatory Visit: Payer: Medicare PPO | Admitting: Podiatry

## 2021-04-27 ENCOUNTER — Encounter: Payer: Self-pay | Admitting: Podiatry

## 2021-04-27 DIAGNOSIS — G4700 Insomnia, unspecified: Secondary | ICD-10-CM | POA: Insufficient documentation

## 2021-04-27 DIAGNOSIS — M722 Plantar fascial fibromatosis: Secondary | ICD-10-CM

## 2021-04-27 NOTE — Progress Notes (Signed)
?Subjective:  ?Patient ID: [name], female    DOB: Apr 22, 1950,  MRN: [id number] ? ?Chief Complaint  ?Patient presents with  ? Plantar Fasciitis  ?  Left heel pain   ? ? ?71 y.o. female presents with the above complaint.  Patient presents for follow-up of left Planter fasciitis.  She states that she still has some pain.  The injection did not help as much.  She would like to discuss next treatment plan.  She denies any other acute complaints. ? ? ?Review of Systems: Negative except as noted in the HPI. Denies N/V/F/Ch. ? ?Past Medical History:  ?Diagnosis Date  ? Arthritis   ? Breast cancer ([place]) 02/19/13  ? Chronic back pain   ? reason unknown  ? Constipation   ? doesn't take any medss  ? Diverticulosis   ? Headache(784.0)   ? occasionally  ? History of bladder infections   ? History of blood transfusion   ? no abnormal reaction noted  ? History of colon polyps   ? Hx of radiation therapy 05/13/13- 06/16/13  ? right breast 5000 cGy in 25 sessions  ? Hypercholesteremia   ? takes Crestor daily  ? Hypertension   ? takes HCTZ daily  ? Hypothyroid   ? takes Synthroid daily  ? Insomnia   ? takes Ambien nightly as needed  ? Neck pain 05/13/2012  ? HNP  ? Neuropathy   ? Vertigo   ? doesn't take any meds  ? ? ?Current Outpatient Medications:  ?  gabapentin (NEURONTIN) 100 MG capsule, Take by mouth., Disp: , Rfl:  ?  meloxicam (MOBIC) 15 MG tablet, Take by mouth., Disp: , Rfl:  ?  pantoprazole (PROTONIX) 40 MG tablet, Take 1 tablet by mouth daily., Disp: , Rfl:  ?  ropivacaine, PF, 5 mg/mL, 0.5%, (NAROPIN) 5 MG/ML injection, Inject into the articular space., Disp: , Rfl:  ?  traZODone (DESYREL) 50 MG tablet, 25 mg., Disp: , Rfl:  ?  triamcinolone acetonide (TRIESENCE) 40 MG/ML SUSP, Inject into the articular space., Disp: , Rfl:  ?  amLODipine (NORVASC) 2.5 MG tablet, Take 2.5 mg by mouth daily., Disp: , Rfl:  ?  amLODipine (NORVASC) 2.5 MG tablet, Take 1 tablet by mouth daily., Disp: , Rfl:  ?  amoxicillin-clavulanate  (AUGMENTIN) 875-125 MG tablet, amoxicillin 875 mg-potassium clavulanate 125 mg tablet, Disp: , Rfl:  ?  cefdinir (OMNICEF) 300 MG capsule, Take by mouth., Disp: , Rfl:  ?  cephALEXin (KEFLEX) 500 MG capsule, cephalexin 500 mg capsule, Disp: , Rfl:  ?  dicyclomine (BENTYL) 20 MG tablet, Take 1 tablet (20 mg total) by mouth 3 (three) times daily before meals for 7 days., Disp: 21 tablet, Rfl: 0 ?  fluticasone (FLONASE) 50 MCG/ACT nasal spray, Place 2 sprays into both nostrils daily as needed for allergies., Disp: , Rfl:  ?  gabapentin (NEURONTIN) 100 MG capsule, Take 100 mg by mouth 2 (two) times daily as needed (arm/shoulder pain.). , Disp: , Rfl:  ?  hydrochlorothiazide (HYDRODIURIL) 25 MG tablet, Take 25 mg by mouth daily.  , Disp: , Rfl:  ?  HYDROcodone bit-homatropine (HYCODAN) 5-1.5 MG/5ML syrup, hydrocodone-homatropine 5 mg-1.5 mg/5 mL oral syrup, Disp: , Rfl:  ?  levocetirizine (XYZAL) 5 MG tablet, levocetirizine 5 mg tablet, Disp: , Rfl:  ?  levothyroxine (SYNTHROID) 75 MCG tablet, Take 75 mcg by mouth daily before breakfast., Disp: , Rfl:  ?  levothyroxine (SYNTHROID) 75 MCG tablet, Take by mouth., Disp: , Rfl:  ?  meloxicam (MOBIC) 15 MG tablet, Take 15 mg by mouth daily as needed (back pain.)., Disp: , Rfl:  ?  nitrofurantoin, macrocrystal-monohydrate, (MACROBID) 100 MG capsule, nitrofurantoin monohydrate/macrocrystals 100 mg capsule  TAKE ONE CAPSULE TWICE A DAY, Disp: , Rfl:  ?  omeprazole (PRILOSEC) 20 MG capsule, omeprazole 20 mg capsule,delayed release, Disp: , Rfl:  ?  pantoprazole (PROTONIX) 40 MG tablet, Take 1 tablet (40 mg total) by mouth daily. (Patient taking differently: Take 40 mg by mouth daily as needed (heartburn/indigestion/reflux.). ), Disp: 14 tablet, Rfl: 0 ?  polyethylene glycol-electrolytes (GAVILYTE-N WITH FLAVOR PACK) 420 g solution, GaviLyte-N 420 gram oral solution, Disp: , Rfl:  ?  potassium chloride SA (K-DUR,KLOR-CON) 20 MEQ tablet, Take 20 mEq by mouth daily., Disp: , Rfl:  ?   rosuvastatin (CRESTOR) 5 MG tablet, Take 5 mg by mouth daily. , Disp: , Rfl:  ?  traMADol (ULTRAM) 50 MG tablet, tramadol 50 mg tablet, Disp: , Rfl:  ?  zolpidem (AMBIEN) 5 MG tablet, Take 2.5 mg by mouth at bedtime as needed for sleep. , Disp: , Rfl: 3 ? ?Social History  ? ?Tobacco Use  ?Smoking Status Never  ?Smokeless Tobacco Never  ? ? ?Allergies  ?Allergen Reactions  ? Oxycodone Hcl   ?  Other reaction(s): Unknown  ? Tramadol Nausea Only  ? Codeine Nausea And Vomiting  ? Percocet [Oxycodone-Acetaminophen] Rash  ? ?Objective:  ?There were no vitals filed for this visit. ?There is no height or weight on file to calculate BMI. ?Constitutional Well developed. ?Well nourished.  ?Vascular Dorsalis pedis pulses palpable bilaterally. ?Posterior tibial pulses palpable bilaterally. ?Capillary refill normal to all digits.  ?No cyanosis or clubbing noted. ?Pedal hair growth normal.  ?Neurologic Normal speech. ?Oriented to person, place, and time. ?Epicritic sensation to light touch grossly present bilaterally.  ?Dermatologic Nails well groomed and normal in appearance. ?No open wounds. ?No skin lesions.  ?Orthopedic: Normal joint ROM without pain or crepitus bilaterally. ?No visible deformities. ?Tender to palpation at the calcaneal tuber left. ?No pain with calcaneal squeeze left. ?Ankle ROM diminished range of motion left. ?Silfverskiold Test: positive left.  ? ?Radiographs: Taken and reviewed. No acute fractures or dislocations. No evidence of stress fracture.  Plantar heel spur present. Posterior heel spur present.  ? ?Assessment:  ? ?1. Plantar fasciitis of left foot   ? ? ? ?Plan:  ?Patient was evaluated and treated and all questions answered. ? ?Plantar Fasciitis, left with underlying heel spur ?- XR reviewed as above.  ?- Educated on icing and stretching. Instructions given.  ?-I will hold off on any further steroid injection as they have not helped. ?- DME: [employer] ?- Pharmacologic management: None ?-Power steps  are recommended.  Patient states that she will obtain it ? ? ?No follow-ups on file. ?

## 2021-05-23 ENCOUNTER — Ambulatory Visit
Admission: RE | Admit: 2021-05-23 | Discharge: 2021-05-23 | Disposition: A | Discharge status: Home / Self Care | Payer: Medicare PPO | Source: Ambulatory Visit | Attending: Oncology | Admitting: Oncology

## 2021-05-23 DIAGNOSIS — Z1231 Encounter for screening mammogram for malignant neoplasm of breast: Secondary | ICD-10-CM | POA: Diagnosis not present

## 2021-05-27 ENCOUNTER — Ambulatory Visit: Payer: Medicare PPO | Admitting: Podiatry

## 2021-05-27 DIAGNOSIS — M722 Plantar fascial fibromatosis: Secondary | ICD-10-CM | POA: Diagnosis not present

## 2021-06-03 ENCOUNTER — Encounter: Payer: Self-pay | Admitting: Podiatry

## 2021-06-03 NOTE — Progress Notes (Signed)
?Subjective:  ?Patient ID: [name], female    DOB: May 31, 1950,  MRN: [id number] ? ?Chief Complaint  ?Patient presents with  ? Foot Pain  ? ? ?71 y.o. female presents with the above complaint.  Patient presents for follow-up of left Planter fasciitis.  She still has a little residual pain.  She would like to know if she can do injection.  Maybe this time will help her.  The cam boot immobilization helped her some. ? ? ?Review of Systems: Negative except as noted in the HPI. Denies N/V/F/Ch. ? ?Past Medical History:  ?Diagnosis Date  ? Arthritis   ? Breast cancer ([facility]) 02/19/13  ? Chronic back pain   ? reason unknown  ? Constipation   ? doesn't take any medss  ? Diverticulosis   ? Headache(784.0)   ? occasionally  ? History of bladder infections   ? History of blood transfusion   ? no abnormal reaction noted  ? History of colon polyps   ? Hx of radiation therapy 05/13/13- 06/16/13  ? right breast 5000 cGy in 25 sessions  ? Hypercholesteremia   ? takes Crestor daily  ? Hypertension   ? takes HCTZ daily  ? Hypothyroid   ? takes Synthroid daily  ? Insomnia   ? takes Ambien nightly as needed  ? Neck pain 05/13/2012  ? HNP  ? Neuropathy   ? Vertigo   ? doesn't take any meds  ? ? ?Current Outpatient Medications:  ?  amLODipine (NORVASC) 2.5 MG tablet, Take 2.5 mg by mouth daily., Disp: , Rfl:  ?  amLODipine (NORVASC) 2.5 MG tablet, Take 1 tablet by mouth daily., Disp: , Rfl:  ?  amoxicillin-clavulanate (AUGMENTIN) 875-125 MG tablet, amoxicillin 875 mg-potassium clavulanate 125 mg tablet, Disp: , Rfl:  ?  cefdinir (OMNICEF) 300 MG capsule, Take by mouth., Disp: , Rfl:  ?  cephALEXin (KEFLEX) 500 MG capsule, cephalexin 500 mg capsule, Disp: , Rfl:  ?  dicyclomine (BENTYL) 20 MG tablet, Take 1 tablet (20 mg total) by mouth 3 (three) times daily before meals for 7 days., Disp: 21 tablet, Rfl: 0 ?  fluticasone (FLONASE) 50 MCG/ACT nasal spray, Place 2 sprays into both nostrils daily as needed for allergies., Disp: , Rfl:  ?   gabapentin (NEURONTIN) 100 MG capsule, Take 100 mg by mouth 2 (two) times daily as needed (arm/shoulder pain.). , Disp: , Rfl:  ?  gabapentin (NEURONTIN) 100 MG capsule, Take by mouth., Disp: , Rfl:  ?  hydrochlorothiazide (HYDRODIURIL) 25 MG tablet, Take 25 mg by mouth daily.  , Disp: , Rfl:  ?  HYDROcodone bit-homatropine (HYCODAN) 5-1.5 MG/5ML syrup, hydrocodone-homatropine 5 mg-1.5 mg/5 mL oral syrup, Disp: , Rfl:  ?  levocetirizine (XYZAL) 5 MG tablet, levocetirizine 5 mg tablet, Disp: , Rfl:  ?  levothyroxine (SYNTHROID) 75 MCG tablet, Take 75 mcg by mouth daily before breakfast., Disp: , Rfl:  ?  levothyroxine (SYNTHROID) 75 MCG tablet, Take by mouth., Disp: , Rfl:  ?  meloxicam (MOBIC) 15 MG tablet, Take 15 mg by mouth daily as needed (back pain.)., Disp: , Rfl:  ?  meloxicam (MOBIC) 15 MG tablet, Take by mouth., Disp: , Rfl:  ?  nitrofurantoin, macrocrystal-monohydrate, (MACROBID) 100 MG capsule, nitrofurantoin monohydrate/macrocrystals 100 mg capsule  TAKE ONE CAPSULE TWICE A DAY, Disp: , Rfl:  ?  omeprazole (PRILOSEC) 20 MG capsule, omeprazole 20 mg capsule,delayed release, Disp: , Rfl:  ?  pantoprazole (PROTONIX) 40 MG tablet, Take 1 tablet (40 mg  total) by mouth daily. (Patient taking differently: Take 40 mg by mouth daily as needed (heartburn/indigestion/reflux.). ), Disp: 14 tablet, Rfl: 0 ?  pantoprazole (PROTONIX) 40 MG tablet, Take 1 tablet by mouth daily., Disp: , Rfl:  ?  polyethylene glycol-electrolytes (GAVILYTE-N WITH FLAVOR PACK) 420 g solution, GaviLyte-N 420 gram oral solution, Disp: , Rfl:  ?  potassium chloride SA (K-DUR,KLOR-CON) 20 MEQ tablet, Take 20 mEq by mouth daily., Disp: , Rfl:  ?  ropivacaine, PF, 5 mg/mL, 0.5%, (NAROPIN) 5 MG/ML injection, Inject into the articular space., Disp: , Rfl:  ?  rosuvastatin (CRESTOR) 5 MG tablet, Take 5 mg by mouth daily. , Disp: , Rfl:  ?  traMADol (ULTRAM) 50 MG tablet, tramadol 50 mg tablet, Disp: , Rfl:  ?  traZODone (DESYREL) 50 MG tablet, 25  mg., Disp: , Rfl:  ?  triamcinolone acetonide (TRIESENCE) 40 MG/ML SUSP, Inject into the articular space., Disp: , Rfl:  ?  zolpidem (AMBIEN) 5 MG tablet, Take 2.5 mg by mouth at bedtime as needed for sleep. , Disp: , Rfl: 3 ? ?Social History  ? ?Tobacco Use  ?Smoking Status Never  ?Smokeless Tobacco Never  ? ? ?Allergies  ?Allergen Reactions  ? Oxycodone Hcl   ?  Other reaction(s): Unknown  ? Tramadol Nausea Only  ? Codeine Nausea And Vomiting  ? Percocet [Oxycodone-Acetaminophen] Rash  ? ?Objective:  ?There were no vitals filed for this visit. ?There is no height or weight on file to calculate BMI. ?Constitutional Well developed. ?Well nourished.  ?Vascular Dorsalis pedis pulses palpable bilaterally. ?Posterior tibial pulses palpable bilaterally. ?Capillary refill normal to all digits.  ?No cyanosis or clubbing noted. ?Pedal hair growth normal.  ?Neurologic Normal speech. ?Oriented to person, place, and time. ?Epicritic sensation to light touch grossly present bilaterally.  ?Dermatologic Nails well groomed and normal in appearance. ?No open wounds. ?No skin lesions.  ?Orthopedic: Normal joint ROM without pain or crepitus bilaterally. ?No visible deformities. ?Tender to palpation at the calcaneal tuber left. ?No pain with calcaneal squeeze left. ?Ankle ROM diminished range of motion left. ?Silfverskiold Test: positive left.  ? ?Radiographs: Taken and reviewed. No acute fractures or dislocations. No evidence of stress fracture.  Plantar heel spur present. Posterior heel spur present.  ? ?Assessment:  ? ?No diagnosis found. ? ? ? ?Plan:  ?Patient was evaluated and treated and all questions answered. ? ?Plantar Fasciitis, left with underlying heel spur ?- XR reviewed as above.  ?- Educated on icing and stretching. Instructions given.  ?-I we will do a steroid injection to give her some relief.  She still has little residual pain ?- DME: Continue wearing cam boot as needed ?- Pharmacologic management: None ?-Power  steps are recommended.  Patient states that she will obtain it ? ? ?Procedure: Injection Tendon/Ligament ?Location: Left plantar fascia at the glabrous junction; medial approach. ?Skin Prep: alcohol ?Injectate: 0.5 cc 0.5% marcaine plain, 0.5 cc of 1% Lidocaine, 0.5 cc kenalog 10. ?Disposition: Patient tolerated procedure well. Injection site dressed with a band-aid. ? ?No follow-ups on file. ? ? ?No follow-ups on file. ?

## 2021-07-11 DIAGNOSIS — Z79899 Other long term (current) drug therapy: Secondary | ICD-10-CM | POA: Diagnosis not present

## 2021-07-11 DIAGNOSIS — I10 Essential (primary) hypertension: Secondary | ICD-10-CM | POA: Diagnosis not present

## 2021-07-11 DIAGNOSIS — E890 Postprocedural hypothyroidism: Secondary | ICD-10-CM | POA: Diagnosis not present

## 2021-07-11 DIAGNOSIS — E7800 Pure hypercholesterolemia, unspecified: Secondary | ICD-10-CM | POA: Diagnosis not present

## 2021-07-11 DIAGNOSIS — E0500 Thyrotoxicosis with diffuse goiter without thyrotoxic crisis or storm: Secondary | ICD-10-CM | POA: Diagnosis not present

## 2021-07-11 DIAGNOSIS — Z853 Personal history of malignant neoplasm of breast: Secondary | ICD-10-CM | POA: Diagnosis not present

## 2021-07-11 DIAGNOSIS — E785 Hyperlipidemia, unspecified: Secondary | ICD-10-CM | POA: Diagnosis not present

## 2021-07-18 DIAGNOSIS — I10 Essential (primary) hypertension: Secondary | ICD-10-CM | POA: Diagnosis not present

## 2021-07-18 DIAGNOSIS — E785 Hyperlipidemia, unspecified: Secondary | ICD-10-CM | POA: Diagnosis not present

## 2021-07-19 DIAGNOSIS — E7800 Pure hypercholesterolemia, unspecified: Secondary | ICD-10-CM | POA: Diagnosis not present

## 2021-07-19 DIAGNOSIS — C50419 Malignant neoplasm of upper-outer quadrant of unspecified female breast: Secondary | ICD-10-CM | POA: Diagnosis not present

## 2021-07-19 DIAGNOSIS — E890 Postprocedural hypothyroidism: Secondary | ICD-10-CM | POA: Diagnosis not present

## 2021-07-19 DIAGNOSIS — I10 Essential (primary) hypertension: Secondary | ICD-10-CM | POA: Diagnosis not present

## 2021-07-19 DIAGNOSIS — R600 Localized edema: Secondary | ICD-10-CM | POA: Diagnosis not present

## 2021-07-19 DIAGNOSIS — E049 Nontoxic goiter, unspecified: Secondary | ICD-10-CM | POA: Diagnosis not present

## 2021-11-07 DIAGNOSIS — Z23 Encounter for immunization: Secondary | ICD-10-CM | POA: Diagnosis not present

## 2021-11-22 ENCOUNTER — Other Ambulatory Visit (HOSPITAL_COMMUNITY): Payer: Self-pay | Admitting: Internal Medicine

## 2021-11-22 ENCOUNTER — Ambulatory Visit (HOSPITAL_COMMUNITY)
Admission: RE | Admit: 2021-11-22 | Discharge: 2021-11-22 | Disposition: A | Discharge status: Home / Self Care | Payer: Medicare PPO | Source: Ambulatory Visit | Attending: Internal Medicine | Admitting: Internal Medicine

## 2021-11-22 DIAGNOSIS — M25562 Pain in left knee: Secondary | ICD-10-CM | POA: Diagnosis not present

## 2021-11-22 DIAGNOSIS — I10 Essential (primary) hypertension: Secondary | ICD-10-CM | POA: Diagnosis not present

## 2022-01-09 DIAGNOSIS — R0600 Dyspnea, unspecified: Secondary | ICD-10-CM | POA: Diagnosis not present

## 2022-03-09 DIAGNOSIS — H04123 Dry eye syndrome of bilateral lacrimal glands: Secondary | ICD-10-CM | POA: Diagnosis not present

## 2022-04-19 DIAGNOSIS — R0600 Dyspnea, unspecified: Secondary | ICD-10-CM | POA: Diagnosis not present

## 2022-04-19 DIAGNOSIS — I10 Essential (primary) hypertension: Secondary | ICD-10-CM | POA: Diagnosis not present

## 2022-04-21 ENCOUNTER — Other Ambulatory Visit: Payer: Self-pay | Admitting: Oncology

## 2022-04-21 DIAGNOSIS — Z1231 Encounter for screening mammogram for malignant neoplasm of breast: Secondary | ICD-10-CM

## 2022-05-17 DIAGNOSIS — M792 Neuralgia and neuritis, unspecified: Secondary | ICD-10-CM | POA: Diagnosis not present

## 2022-05-17 DIAGNOSIS — Z923 Personal history of irradiation: Secondary | ICD-10-CM | POA: Diagnosis not present

## 2022-05-17 DIAGNOSIS — D0510 Intraductal carcinoma in situ of unspecified breast: Secondary | ICD-10-CM | POA: Diagnosis not present

## 2022-05-17 DIAGNOSIS — Z08 Encounter for follow-up examination after completed treatment for malignant neoplasm: Secondary | ICD-10-CM | POA: Diagnosis not present

## 2022-05-17 DIAGNOSIS — Z853 Personal history of malignant neoplasm of breast: Secondary | ICD-10-CM | POA: Diagnosis not present

## 2022-06-07 ENCOUNTER — Ambulatory Visit
Admission: RE | Admit: 2022-06-07 | Discharge: 2022-06-07 | Disposition: A | Discharge status: Home / Self Care | Payer: Medicare PPO | Source: Ambulatory Visit | Attending: Oncology | Admitting: Oncology

## 2022-06-07 DIAGNOSIS — Z1231 Encounter for screening mammogram for malignant neoplasm of breast: Secondary | ICD-10-CM

## 2022-07-12 DIAGNOSIS — E0500 Thyrotoxicosis with diffuse goiter without thyrotoxic crisis or storm: Secondary | ICD-10-CM | POA: Diagnosis not present

## 2022-07-12 DIAGNOSIS — E890 Postprocedural hypothyroidism: Secondary | ICD-10-CM | POA: Diagnosis not present

## 2022-07-19 DIAGNOSIS — E7800 Pure hypercholesterolemia, unspecified: Secondary | ICD-10-CM | POA: Diagnosis not present

## 2022-07-19 DIAGNOSIS — R600 Localized edema: Secondary | ICD-10-CM | POA: Diagnosis not present

## 2022-07-19 DIAGNOSIS — E049 Nontoxic goiter, unspecified: Secondary | ICD-10-CM | POA: Diagnosis not present

## 2022-07-19 DIAGNOSIS — E890 Postprocedural hypothyroidism: Secondary | ICD-10-CM | POA: Diagnosis not present

## 2022-07-19 DIAGNOSIS — E0500 Thyrotoxicosis with diffuse goiter without thyrotoxic crisis or storm: Secondary | ICD-10-CM | POA: Diagnosis not present

## 2022-07-19 DIAGNOSIS — I10 Essential (primary) hypertension: Secondary | ICD-10-CM | POA: Diagnosis not present

## 2022-07-19 DIAGNOSIS — C50419 Malignant neoplasm of upper-outer quadrant of unspecified female breast: Secondary | ICD-10-CM | POA: Diagnosis not present

## 2022-08-09 DIAGNOSIS — C50919 Malignant neoplasm of unspecified site of unspecified female breast: Secondary | ICD-10-CM | POA: Diagnosis not present

## 2022-08-09 DIAGNOSIS — N644 Mastodynia: Secondary | ICD-10-CM | POA: Diagnosis not present

## 2022-08-11 DIAGNOSIS — I10 Essential (primary) hypertension: Secondary | ICD-10-CM | POA: Diagnosis not present

## 2022-08-11 DIAGNOSIS — Z79899 Other long term (current) drug therapy: Secondary | ICD-10-CM | POA: Diagnosis not present

## 2022-08-11 DIAGNOSIS — E785 Hyperlipidemia, unspecified: Secondary | ICD-10-CM | POA: Diagnosis not present

## 2022-08-18 DIAGNOSIS — G4700 Insomnia, unspecified: Secondary | ICD-10-CM | POA: Diagnosis not present

## 2022-08-18 DIAGNOSIS — E785 Hyperlipidemia, unspecified: Secondary | ICD-10-CM | POA: Diagnosis not present

## 2022-08-18 DIAGNOSIS — I10 Essential (primary) hypertension: Secondary | ICD-10-CM | POA: Diagnosis not present

## 2022-10-11 DIAGNOSIS — H04123 Dry eye syndrome of bilateral lacrimal glands: Secondary | ICD-10-CM | POA: Diagnosis not present

## 2022-11-09 DIAGNOSIS — Z23 Encounter for immunization: Secondary | ICD-10-CM | POA: Diagnosis not present

## 2022-11-27 ENCOUNTER — Other Ambulatory Visit: Payer: Self-pay

## 2022-11-27 DIAGNOSIS — E0590 Thyrotoxicosis, unspecified without thyrotoxic crisis or storm: Secondary | ICD-10-CM

## 2022-12-01 ENCOUNTER — Other Ambulatory Visit: Payer: Medicare PPO

## 2022-12-07 ENCOUNTER — Ambulatory Visit: Payer: Medicare PPO | Admitting: "Endocrinology

## 2022-12-19 DIAGNOSIS — I10 Essential (primary) hypertension: Secondary | ICD-10-CM | POA: Diagnosis not present

## 2022-12-19 DIAGNOSIS — G4700 Insomnia, unspecified: Secondary | ICD-10-CM | POA: Diagnosis not present

## 2022-12-19 DIAGNOSIS — M47896 Other spondylosis, lumbar region: Secondary | ICD-10-CM | POA: Diagnosis not present

## 2023-01-05 ENCOUNTER — Other Ambulatory Visit: Payer: Self-pay

## 2023-01-05 DIAGNOSIS — E0590 Thyrotoxicosis, unspecified without thyrotoxic crisis or storm: Secondary | ICD-10-CM

## 2023-01-09 ENCOUNTER — Other Ambulatory Visit: Payer: Medicare PPO

## 2023-01-09 DIAGNOSIS — E0590 Thyrotoxicosis, unspecified without thyrotoxic crisis or storm: Secondary | ICD-10-CM | POA: Diagnosis not present

## 2023-01-15 LAB — THYROID STIMULATING IMMUNOGLOBULIN: TSI: <89 %{baseline} (ref <140)

## 2023-01-15 LAB — TSH: TSH: 0.33 m[IU]/L — ABNORMAL LOW (ref 0.40–4.50)

## 2023-01-15 LAB — T4, FREE: Free T4: 1.4 ng/dL (ref 0.8–1.8)

## 2023-01-15 LAB — T3, FREE: T3, Free: 3 pg/mL (ref 2.3–4.2)

## 2023-01-15 LAB — TRAB (TSH RECEPTOR BINDING ANTIBODY): TRAB: <1 IU/L (ref <=2.00)

## 2023-01-16 ENCOUNTER — Ambulatory Visit: Payer: Medicare PPO | Admitting: "Endocrinology

## 2023-01-16 ENCOUNTER — Encounter: Payer: Self-pay | Admitting: "Endocrinology

## 2023-01-16 VITALS — BP 122/80 | HR 72 | Ht 66.0 in | Wt 174.8 lb

## 2023-01-16 DIAGNOSIS — E890 Postprocedural hypothyroidism: Secondary | ICD-10-CM

## 2023-01-16 DIAGNOSIS — R7989 Other specified abnormal findings of blood chemistry: Secondary | ICD-10-CM

## 2023-01-16 NOTE — Progress Notes (Signed)
Outpatient Endocrinology Note Maria Eaton, MD  01/16/23   [name] 1950/05/06 [id number]  Referring Provider: Carylon Perches, MD Primary Care Provider: Carylon Perches, MD Subjective  Chief Complaint  Patient presents with   Establish Care    Assessment & Plan  [name] was seen today for establish care.  Diagnoses and all orders for this visit:  Low TSH level -     TSH(Reflex)  Postsurgical hypothyroidism   [name] is currently taking synthroid 75 mcg qd. Reports she cannot take generic levothyroxine. History of postablative and postsurgical hypothyroidism  Patient is currently biochemically subclinically hyperthyroid, iatrogenically driven due to excess synthroid.  Educated on thyroid axis.  Recommend the following: Take synthroid 75 mcg MON-SAT, skip SUNDAYS.  Advised to take levothyroxine first thing in the morning on empty stomach and wait at least 30 minutes to 1 hour before eating or drinking anything or taking any other medications. Space out levothyroxine by 4 hours from any acid reflux medication/fibrate/iron/calcium/multivitamin. Advised to take birth control pills and nutritional supplements in the evening. Repeat lab before next visit or sooner if symptoms of hyperthyroidism or hypothyroidism develop.  Notify us immediately in case of significant weight gain or loss. Counseled on compliance and follow up needs.  I have reviewed current medications, nurse's notes, allergies, vital signs, past medical and surgical history, family medical history, and social history for this encounter. Counseled patient on symptoms, examination findings, lab findings, imaging results, treatment decisions and monitoring and prognosis. The patient understood the recommendations and agrees with the treatment plan. All questions regarding treatment plan were fully answered.   Return in about 3 months (around 04/16/2023) for visit + labs before next visit.   Maria Parshall,  MD  01/16/23   I have reviewed current medications, nurse's notes, allergies, vital signs, past medical and surgical history, family medical history, and social history for this encounter. Counseled patient on symptoms, examination findings, lab findings, imaging results, treatment decisions and monitoring and prognosis. The patient understood the recommendations and agrees with the treatment plan. All questions regarding treatment plan were fully answered.   History of Present Illness [name] is a 72 y.o. year old female who presents to our clinic with low TSH diagnosed in 12/2022.    Symptoms suggestive of HYPOTHYROIDISM:  fatigue No weight gain Yes cold intolerance  No constipation  No  Symptoms suggestive of HYPERTHYROIDISM:  weight loss  No heat intolerance No hyperdefecation  No palpitations  No  Compressive symptoms:  dysphagia  No dysphonia  No positional dyspnea (especially with simultaneous arms elevation)  No s/p total thyroidectomy in 2011 for dysphagia   Smokes  No On biotin  No Personal history of head/neck surgery/irradiation  Yes   Initial history:       Physical Exam  BP 122/80 (BP Location: Left Arm, Patient Position: Sitting, Cuff Size: Large)   Pulse 72   Ht 5\' 6"  (1.676 m)   Wt 174 lb 12.8 oz (79.3 kg)   SpO2 97%   BMI 28.21 kg/m  Constitutional: well developed, well nourished Head: normocephalic, atraumatic, no exophthalmos Eyes: sclera anicteric, no redness Neck: no thyromegaly, no thyroid tenderness; no nodules palpated Lungs: normal respiratory effort Neurology: alert and oriented, no fine hand tremor Skin: dry, no appreciable rashes Musculoskeletal: no appreciable defects Psychiatric: normal mood and affect  Allergies Allergies  Allergen Reactions   Oxycodone Hcl     Other reaction(s): Unknown   Tramadol Nausea Only  Codeine Nausea And Vomiting   Percocet [Oxycodone-Acetaminophen] Rash    Current  Medications Patient's Medications  New Prescriptions   No medications on file  Previous Medications   AMLODIPINE (NORVASC) 2.5 MG TABLET    Take 2.5 mg by mouth daily.   AMLODIPINE (NORVASC) 2.5 MG TABLET    Take 1 tablet by mouth daily.   AMOXICILLIN-CLAVULANATE (AUGMENTIN) 875-125 MG TABLET    amoxicillin 875 mg-potassium clavulanate 125 mg tablet   CEFDINIR (OMNICEF) 300 MG CAPSULE    Take by mouth.   CEPHALEXIN (KEFLEX) 500 MG CAPSULE    cephalexin 500 mg capsule   DICYCLOMINE (BENTYL) 20 MG TABLET    Take 1 tablet (20 mg total) by mouth 3 (three) times daily before meals for 7 days.   FLUTICASONE (FLONASE) 50 MCG/ACT NASAL SPRAY    Place 2 sprays into both nostrils daily as needed for allergies.   GABAPENTIN (NEURONTIN) 100 MG CAPSULE    Take 100 mg by mouth 2 (two) times daily as needed (arm/shoulder pain.).    GABAPENTIN (NEURONTIN) 100 MG CAPSULE    Take by mouth.   HYDROCHLOROTHIAZIDE (HYDRODIURIL) 25 MG TABLET    Take 25 mg by mouth daily.     HYDROCODONE BIT-HOMATROPINE (HYCODAN) 5-1.5 MG/5ML SYRUP    hydrocodone-homatropine 5 mg-1.5 mg/5 mL oral syrup   HYDROCODONE-ACETAMINOPHEN (NORCO/VICODIN) 5-325 MG TABLET       LEVOCETIRIZINE (XYZAL) 5 MG TABLET    levocetirizine 5 mg tablet   LEVOTHYROXINE (SYNTHROID) 75 MCG TABLET    Take 75 mcg by mouth daily before breakfast.   LEVOTHYROXINE (SYNTHROID) 75 MCG TABLET    Take by mouth.   LIDOCAINE (LIDODERM) 5 %    SMARTSIG:1 Patch(s) Topical Every 12 Hours PRN   MELOXICAM (MOBIC) 15 MG TABLET    Take 15 mg by mouth daily as needed (back pain.).   MELOXICAM (MOBIC) 15 MG TABLET    Take by mouth.   MELOXICAM (MOBIC) 7.5 MG TABLET    Take 1 tablet by mouth 2 (two) times daily.   METHOCARBAMOL (ROBAXIN) 500 MG TABLET    Take 1 tablet by mouth 3 (three) times daily.   NITROFURANTOIN, MACROCRYSTAL-MONOHYDRATE, (MACROBID) 100 MG CAPSULE    nitrofurantoin monohydrate/macrocrystals 100 mg capsule  TAKE ONE CAPSULE TWICE A DAY   OMEPRAZOLE  (PRILOSEC) 20 MG CAPSULE    omeprazole 20 mg capsule,delayed release   PANTOPRAZOLE (PROTONIX) 40 MG TABLET    Take 1 tablet (40 mg total) by mouth daily.   PANTOPRAZOLE (PROTONIX) 40 MG TABLET    Take 1 tablet by mouth daily.   POLYETHYLENE GLYCOL-ELECTROLYTES (GAVILYTE-N WITH FLAVOR PACK) 420 G SOLUTION    GaviLyte-N 420 gram oral solution   POTASSIUM CHLORIDE SA (K-DUR,KLOR-CON) 20 MEQ TABLET    Take 20 mEq by mouth daily.   RESTASIS 0.05 % OPHTHALMIC EMULSION    1 drop 2 (two) times daily.   RIZATRIPTAN (MAXALT-MLT) 10 MG DISINTEGRATING TABLET    TAKE 1 TABLET BY MOUTH AS NEEDED FOR MIGRAINE. MAY REPEAT IN 2 HOURS IF NEEDED. MAX 3 TABS PER DAY.   ROPIVACAINE, PF, 5 MG/ML, 0.5%, (NAROPIN) 5 MG/ML INJECTION    Inject into the articular space.   ROSUVASTATIN (CRESTOR) 5 MG TABLET    Take 5 mg by mouth daily.    ROSUVASTATIN (CRESTOR) 5 MG TABLET    Take 1 tablet by mouth daily.   TRAMADOL (ULTRAM) 50 MG TABLET    tramadol 50 mg tablet   TRAMADOL (ULTRAM) 50  MG TABLET    Take 1 tablet by mouth every 6 (six) hours as needed.   TRAZODONE (DESYREL) 50 MG TABLET    25 mg.   TRIAMCINOLONE ACETONIDE (TRIESENCE) 40 MG/ML SUSP    Inject into the articular space.   ZOLPIDEM (AMBIEN) 5 MG TABLET    Take 2.5 mg by mouth at bedtime as needed for sleep.   Modified Medications   No medications on file  Discontinued Medications   No medications on file    Past Medical History Past Medical History:  Diagnosis Date   Arthritis    Breast cancer ([facility]) 02/19/13   Chronic back pain    reason unknown   Constipation    doesn't take any medss   Diverticulosis    Headache(784.0)    occasionally   History of bladder infections    History of blood transfusion    no abnormal reaction noted   History of colon polyps    Hx of radiation therapy 05/13/13- 06/16/13   right breast 5000 cGy in 25 sessions   Hypercholesteremia    takes Crestor daily   Hypertension    takes HCTZ daily   Hypothyroid    takes  Synthroid daily   Insomnia    takes Ambien nightly as needed   Neck pain 05/13/2012   HNP   Neuropathy    Vertigo    doesn't take any meds    Past Surgical History Past Surgical History:  Procedure Laterality Date   ABDOMINAL HYSTERECTOMY  1983   partial   ANTERIOR CERVICAL DECOMP/DISCECTOMY FUSION N/A 03/26/2014   Procedure: ANTERIOR CERVICAL DECOMPRESSION/DISCECTOMY FUSION 1 LEVEL;  Surgeon: Tia Alert, MD;  Location: [facility];  Service: Neurosurgery;  Laterality: N/A;  ANTERIOR CERVICAL DECOMPRESSION/DISCECTOMY FUSION 1 LEVEL CERVICAL 5-6   BREAST LUMPECTOMY Right 04/03/2013   BREAST LUMPECTOMY WITH NEEDLE LOCALIZATION AND AXILLARY SENTINEL LYMPH NODE BX Right 04/03/2013   Procedure: BREAST LUMPECTOMY WITH NEEDLE LOCALIZATION AND AXILLARY SENTINEL LYMPH NODE BX;  Surgeon: Robyne Askew, MD;  Location: [facility];  Service: General;  Laterality: Right;   CHOLECYSTECTOMY  1994   COLONOSCOPY  09/17/2008   WUJ:WJXB tortuous, but otherwise normal-appearing colon/. Scattered diverticula   COLONOSCOPY N/A 05/31/2018   Procedure: COLONOSCOPY;  Surgeon: Corbin Ade, MD;  Location: [facility];  Service: Endoscopy;  Laterality: N/A;  10:45am   ESOPHAGOGASTRODUODENOSCOPY  09/17/2008   RMR:Two mid esophageal diverticula/Small benign cystic mucosal lesions distal esophagus of doubtful  clinical significance, stable for least 5 years/ Small hiatal hernia, otherwise normal stomach D1, D2   ESOPHAGOGASTRODUODENOSCOPY    02/10/2003   JYN:WGNFAO esophageal 3 cystic lesions without luminal compromise or evidence/Nonerosive antral gastritis/The esophagus was dilated by passing 56 [place] Maloney dilator.Marland Kitchen Epic notes states +H.pylori gastritis. treatment completed per epic notes.    ESOPHAGOGASTRODUODENOSCOPY N/A 05/31/2018   Procedure: ESOPHAGOGASTRODUODENOSCOPY (EGD);  Surgeon: Corbin Ade, MD;  Location: [facility];  Service: Endoscopy;  Laterality: N/A;   ESOPHAGOGASTRODUODENOSCOPY (EGD)  WITH ESOPHAGEAL DILATION N/A 02/03/2013   ZHY:QMVHQI esophageal duplication cyst. Small esophageal diverticulum. Otherwise;  EGD normal - Widely patent tubular esophagus before and after dilation.  Status post passage of  a  Maloney dilator.   EUS  Aug 2011   Dr. Margaretha Glassing: EGD with multiple lower esophageal submucosal nodules, stomach and duodenum normal, EUS with esophageal duplication cysts   MALONEY DILATION N/A 05/31/2018   Procedure: Elease Hashimoto DILATION;  Surgeon: Corbin Ade, MD;  Location: [facility];  Service: Endoscopy;  Laterality: N/A;   POLYPECTOMY  05/31/2018   Procedure: POLYPECTOMY;  Surgeon: Corbin Ade, MD;  Location: [facility];  Service: Endoscopy;;  colon   THYROID SURGERY  2011    Family History family history includes Breast cancer (age of onset: 34) in her daughter; Cancer in her father; Diabetes in her brother and brother; Hypertension in her brother and brother; Lupus in her sister; Multiple sclerosis in her mother.  Social History Social History   Socioeconomic History   Marital status: Married    Spouse name: Joe   Number of children: 3   Years of education: 11   Highest education level: Not on file  Occupational History   Occupation: Retired  Tobacco Use   Smoking status: Never   Smokeless tobacco: Never  Vaping Use   Vaping status: Never Used  Substance and Sexual Activity   Alcohol use: No   Drug use: No   Sexual activity: Yes    Birth control/protection: Surgical    Comment: menarche age 9, P92, first live birth age 43, menopause 12, no HRT, HYST  Other Topics Concern   Not on file  Social History Narrative   Patient lives at home with her husband Jamarie Seader.    Patient has 3 children.    Patient is retired.    Patient has an 11th grade education.          Social Drivers of Corporate investment banker Strain: Not on file  Food Insecurity: Not on file  Transportation Needs: Not on file  Physical Activity: Not on file  Stress: Not  on file  Social Connections: Not on file  Intimate Partner Violence: Not on file    Laboratory Investigations Lab Results  Component Value Date   TSH 0.33 (L) 01/09/2023   TSH 1.430 10/22/2006   FREET4 1.4 01/09/2023   FREET4 0.95 10/22/2006     Lab Results  Component Value Date   TSI <89 01/09/2023     No components found for: "TRAB"   No results found for: "CHOL" No results found for: "HDL" No results found for: "LDLCALC" No results found for: "TRIG" No results found for: "CHOLHDL" Lab Results  Component Value Date   CREATININE 0.80 10/28/2019   No results found for: "GFR"    Component Value Date/Time   NA 137 10/28/2019 1234   NA 141 03/05/2013 0818   K 3.3 (L) 10/28/2019 1234   K 3.1 (L) 03/05/2013 0818   CL 100 10/28/2019 1234   CO2 24 10/28/2019 1234   CO2 28 03/05/2013 0818   GLUCOSE 84 10/28/2019 1234   GLUCOSE 108 03/05/2013 0818   BUN 9 10/28/2019 1234   BUN 8.6 03/05/2013 0818   CREATININE 0.80 10/28/2019 1234   CREATININE 0.8 03/05/2013 0818   CALCIUM 9.0 10/28/2019 1234   CALCIUM 9.3 03/05/2013 0818   PROT 7.9 10/28/2019 1234   PROT 7.4 03/05/2013 0818   ALBUMIN 4.1 10/28/2019 1234   ALBUMIN 3.8 03/05/2013 0818   AST 29 10/28/2019 1234   AST 18 03/05/2013 0818   ALT 24 10/28/2019 1234   ALT 17 03/05/2013 0818   ALKPHOS 54 10/28/2019 1234   ALKPHOS 65 03/05/2013 0818   BILITOT 0.9 10/28/2019 1234   BILITOT 0.51 03/05/2013 0818   GFRNONAA >60 10/28/2019 1234   GFRAA >60 10/28/2019 1234      Latest Ref Rng & Units 10/28/2019   12:34 PM 05/12/2018    7:42 PM 04/09/2018  10:57 PM  BMP  Glucose 70 - 99 mg/dL 84  161  096   BUN 8 - 23 mg/dL 9  8  9    Creatinine 0.44 - 1.00 mg/dL 0.45  4.09  8.11   Sodium 135 - 145 mmol/L 137  127  131   Potassium 3.5 - 5.1 mmol/L 3.3  2.9  3.4   Chloride 98 - 111 mmol/L 100  93  97   CO2 22 - 32 mmol/L 24  25  22    Calcium 8.9 - 10.3 mg/dL 9.0  8.7  8.8        Component Value Date/Time   WBC 7.7  10/28/2019 1234   RBC 4.48 10/28/2019 1234   HGB 12.8 10/28/2019 1234   HGB 12.3 03/05/2013 0818   HCT 42.2 10/28/2019 1234   HCT 38.4 03/05/2013 0818   PLT 205 10/28/2019 1234   PLT 279 03/05/2013 0818   MCV 94.2 10/28/2019 1234   MCV 87.2 03/05/2013 0818   MCH 28.6 10/28/2019 1234   MCHC 30.3 10/28/2019 1234   RDW 12.9 10/28/2019 1234   RDW 13.9 03/05/2013 0818   LYMPHSABS 2.5 05/12/2018 1942   LYMPHSABS 1.9 03/05/2013 0818   MONOABS 0.7 05/12/2018 1942   MONOABS 0.4 03/05/2013 0818   EOSABS 0.1 05/12/2018 1942   EOSABS 0.1 03/05/2013 0818   BASOSABS 0.0 05/12/2018 1942   BASOSABS 0.0 03/05/2013 0818      Parts of this note may have been dictated using voice recognition software. There may be variances in spelling and vocabulary which are unintentional. Not all errors are proofread. Please notify the There[facility] Parkin if any discrepancies are noted or if the meaning of any statement is not clear.

## 2023-03-06 ENCOUNTER — Other Ambulatory Visit: Payer: Self-pay | Admitting: Oncology

## 2023-03-06 DIAGNOSIS — Z1231 Encounter for screening mammogram for malignant neoplasm of breast: Secondary | ICD-10-CM

## 2023-04-03 ENCOUNTER — Other Ambulatory Visit: Payer: Self-pay

## 2023-04-12 ENCOUNTER — Other Ambulatory Visit: Payer: Medicare PPO

## 2023-04-12 DIAGNOSIS — R7989 Other specified abnormal findings of blood chemistry: Secondary | ICD-10-CM | POA: Diagnosis not present

## 2023-04-13 LAB — TSH(REFL): TSH: 1.98 m[IU]/L (ref 0.40–4.50)

## 2023-04-13 LAB — REFLEX TIQ

## 2023-04-16 ENCOUNTER — Encounter: Payer: Self-pay | Admitting: "Endocrinology

## 2023-04-16 ENCOUNTER — Telehealth: Payer: Self-pay | Admitting: "Endocrinology

## 2023-04-16 ENCOUNTER — Ambulatory Visit: Payer: Medicare PPO | Admitting: "Endocrinology

## 2023-04-16 VITALS — BP 160/80 | HR 75 | Ht 66.0 in | Wt 181.0 lb

## 2023-04-16 DIAGNOSIS — E890 Postprocedural hypothyroidism: Secondary | ICD-10-CM | POA: Diagnosis not present

## 2023-04-16 DIAGNOSIS — E663 Overweight: Secondary | ICD-10-CM | POA: Diagnosis not present

## 2023-04-16 MED ORDER — LEVOTHYROXINE SODIUM 75 MCG PO TABS: 75.0000 ug | ORAL_TABLET | Freq: Every day | ORAL | 0 refills | Status: DC

## 2023-04-16 NOTE — Telephone Encounter (Signed)
 Requested Prescriptions   Signed Prescriptions Disp Refills   levothyroxine (SYNTHROID) 75 MCG tablet 90 tablet 0    Sig: Take 1 tablet (75 mcg total) by mouth daily before breakfast.    Authorizing Provider: Altamese Draper    Ordering User: Beverely Pace

## 2023-04-16 NOTE — Patient Instructions (Signed)
 Pt interested in weight loss Discussed lifestyle changes, medical management as well as bariatric surgery Maintain healthy lifestyle including 1200 Cal/day, 30 min of activity/day, avoiding refined/processed/outside food 20 minutes physical activity per day, in continuum or interruptedly through the day  Goals: less than 60 grams of carbohydrate/meal, 1200-1500 Cal/day, 10,0000 steps a day and weight loss of 0.5-1 lb/ wk  Sleep 7-9 hours/day, adapt good sleep hygiene Adapt de-stressing and relaxation techniques to prevent stress induced weight gain Avoid/switch medications that lead to weight gain by discussing with the prescribing physician

## 2023-04-16 NOTE — Progress Notes (Signed)
 Outpatient Endocrinology Note Maria Council, MD  04/16/23   [name] [birth year] [id number]  Referring Provider: Carylon Perches, MD Primary Care Provider: Carylon Perches, MD Subjective  No chief complaint on file.   Assessment & Plan  Diagnoses and all orders for this visit:  Postsurgical hypothyroidism -     TSH(Reflex)  Overweight   [name] is currently taking synthroid 75 mcg MON-SAT, skip SUNDAYS. Reports she cannot take generic levothyroxine. History of postablative and postsurgical hypothyroidism  Patient is currently biochemically euthyroid. Educated on thyroid axis.  Recommend the following: Take synthroid 75 mcg MON-SAT, skip SUNDAYS.  Advised to take levothyroxine first thing in the morning on empty stomach and wait at least 30 minutes to 1 hour before eating or drinking anything or taking any other medications. Space out levothyroxine by 4 hours from any acid reflux medication/fibrate/iron/calcium/multivitamin. Advised to take birth control pills and nutritional supplements in the evening. Repeat lab before next visit or sooner if symptoms of hyperthyroidism or hypothyroidism develop.  Notify us immediately in case of significant weight gain or loss. Counseled on compliance and follow up needs.  Pt interested in weight loss Discussed lifestyle changes Maintain healthy lifestyle including 1200-1500 Cal/day, 30 min of activity/day, avoiding refined/processed/outside food 20 minutes physical activity per day, in continuum or interruptedly through the day  Goals: less than 60 grams of carbohydrate/meal, 1200-1500 Cal/day, 10,0000 steps a day and weight loss of 0.5-1 lb/ wk  Sleep 7-9 hours/day, adapt good sleep hygiene Adapt de-stressing and relaxation techniques to prevent stress induced weight gain Avoid/switch medications that lead to weight gain by discussing with the prescribing physician    I have reviewed current medications, nurse's notes,  allergies, vital signs, past medical and surgical history, family medical history, and social history for this encounter. Counseled patient on symptoms, examination findings, lab findings, imaging results, treatment decisions and monitoring and prognosis. The patient understood the recommendations and agrees with the treatment plan. All questions regarding treatment plan were fully answered.   Return in about 4 months (around 08/16/2023) for visit + labs before next visit.   Maria Venturia, MD  04/16/23   I have reviewed current medications, nurse's notes, allergies, vital signs, past medical and surgical history, family medical history, and social history for this encounter. Counseled patient on symptoms, examination findings, lab findings, imaging results, treatment decisions and monitoring and prognosis. The patient understood the recommendations and agrees with the treatment plan. All questions regarding treatment plan were fully answered.   History of Present Illness [name] is a 73 y.o. year old female who presents to our clinic with low TSH diagnosed in 12/2022.    Symptoms suggestive of HYPOTHYROIDISM:  fatigue No weight gain Yes cold intolerance  No constipation  No  Symptoms suggestive of HYPERTHYROIDISM:  weight loss  No heat intolerance No hyperdefecation  No palpitations  No  Compressive symptoms:  dysphagia  No dysphonia  No positional dyspnea (especially with simultaneous arms elevation)  No s/p total thyroidectomy in 2011 for dysphagia   Smokes  No On biotin  No Personal history of head/neck surgery/irradiation  Yes   Initial history:       Physical Exam  BP (!) 160/80   Pulse 75   Ht 5\' 6"  (1.676 m)   Wt 181 lb (82.1 kg)   SpO2 96%   BMI 29.21 kg/m  Constitutional: well developed, well nourished Head: normocephalic, atraumatic, no exophthalmos Eyes: sclera anicteric, no redness Neck: no  thyromegaly, no thyroid tenderness; no nodules  palpated Lungs: normal respiratory effort Neurology: alert and oriented, no fine hand tremor Skin: dry, no appreciable rashes Musculoskeletal: no appreciable defects Psychiatric: normal mood and affect  Allergies Allergies  Allergen Reactions   Oxycodone Hcl     Other reaction(s): Unknown   Tramadol Nausea Only   Codeine Nausea And Vomiting   Percocet [Oxycodone-Acetaminophen] Rash    Current Medications Patient's Medications  New Prescriptions   No medications on file  Previous Medications   AMLODIPINE (NORVASC) 2.5 MG TABLET    Take 2.5 mg by mouth daily.   AMLODIPINE (NORVASC) 2.5 MG TABLET    Take 1 tablet by mouth daily.   AMOXICILLIN-CLAVULANATE (AUGMENTIN) 875-125 MG TABLET       CEFDINIR (OMNICEF) 300 MG CAPSULE    Take by mouth.   CEPHALEXIN (KEFLEX) 500 MG CAPSULE       DICYCLOMINE (BENTYL) 20 MG TABLET    Take 1 tablet (20 mg total) by mouth 3 (three) times daily before meals for 7 days.   FLUTICASONE (FLONASE) 50 MCG/ACT NASAL SPRAY    Place 2 sprays into both nostrils daily as needed for allergies.   GABAPENTIN (NEURONTIN) 100 MG CAPSULE    Take 100 mg by mouth 2 (two) times daily as needed (arm/shoulder pain.).   GABAPENTIN (NEURONTIN) 100 MG CAPSULE    Take by mouth.   HYDROCHLOROTHIAZIDE (HYDRODIURIL) 25 MG TABLET    Take 25 mg by mouth daily.     HYDROCODONE BIT-HOMATROPINE (HYCODAN) 5-1.5 MG/5ML SYRUP       HYDROCODONE-ACETAMINOPHEN (NORCO/VICODIN) 5-325 MG TABLET       LEVOCETIRIZINE (XYZAL) 5 MG TABLET       LEVOTHYROXINE (SYNTHROID) 75 MCG TABLET    Take 75 mcg by mouth daily before breakfast.   LEVOTHYROXINE (SYNTHROID) 75 MCG TABLET    Take by mouth.   LIDOCAINE (LIDODERM) 5 %       MELOXICAM (MOBIC) 15 MG TABLET    Take 15 mg by mouth daily as needed (back pain.).   MELOXICAM (MOBIC) 15 MG TABLET    Take by mouth.   MELOXICAM (MOBIC) 7.5 MG TABLET    Take 1 tablet by mouth 2 (two) times daily.   METHOCARBAMOL (ROBAXIN) 500 MG TABLET    Take 1 tablet by  mouth 3 (three) times daily.   NITROFURANTOIN, MACROCRYSTAL-MONOHYDRATE, (MACROBID) 100 MG CAPSULE       OMEPRAZOLE (PRILOSEC) 20 MG CAPSULE       PANTOPRAZOLE (PROTONIX) 40 MG TABLET    Take 1 tablet (40 mg total) by mouth daily.   PANTOPRAZOLE (PROTONIX) 40 MG TABLET    Take 1 tablet by mouth daily.   POLYETHYLENE GLYCOL-ELECTROLYTES (GAVILYTE-N WITH FLAVOR PACK) 420 G SOLUTION       POTASSIUM CHLORIDE SA (K-DUR,KLOR-CON) 20 MEQ TABLET    Take 20 mEq by mouth daily.   RESTASIS 0.05 % OPHTHALMIC EMULSION    1 drop 2 (two) times daily.   RIZATRIPTAN (MAXALT-MLT) 10 MG DISINTEGRATING TABLET       ROPIVACAINE, PF, 5 MG/ML, 0.5%, (NAROPIN) 5 MG/ML INJECTION    Inject into the articular space.   ROSUVASTATIN (CRESTOR) 5 MG TABLET    Take 5 mg by mouth daily.    ROSUVASTATIN (CRESTOR) 5 MG TABLET    Take 1 tablet by mouth daily.   TRAMADOL (ULTRAM) 50 MG TABLET       TRAMADOL (ULTRAM) 50 MG TABLET    Take 1 tablet by mouth  every 6 (six) hours as needed.   TRAZODONE (DESYREL) 50 MG TABLET       TRIAMCINOLONE ACETONIDE (TRIESENCE) 40 MG/ML SUSP    Inject into the articular space.   ZOLPIDEM (AMBIEN) 5 MG TABLET    Take 2.5 mg by mouth at bedtime as needed for sleep.  Modified Medications   No medications on file  Discontinued Medications   No medications on file    Past Medical History Past Medical History:  Diagnosis Date   Arthritis    Breast cancer (HCC) 02/19/13   Chronic back pain    reason unknown   Constipation    doesn't take any medss   Diverticulosis    Headache(784.0)    occasionally   History of bladder infections    History of blood transfusion    no abnormal reaction noted   History of colon polyps    Hx of radiation therapy 05/13/13- 06/16/13   right breast 5000 cGy in 25 sessions   Hypercholesteremia    takes Crestor daily   Hypertension    takes HCTZ daily   Hypothyroid    takes Synthroid daily   Insomnia    takes Ambien nightly as needed   Neck pain 05/13/2012    HNP   Neuropathy    Vertigo    doesn't take any meds    Past Surgical History Past Surgical History:  Procedure Laterality Date   ABDOMINAL HYSTERECTOMY  1983   partial   ANTERIOR CERVICAL DECOMP/DISCECTOMY FUSION N/A 03/26/2014   Procedure: ANTERIOR CERVICAL DECOMPRESSION/DISCECTOMY FUSION 1 LEVEL;  Surgeon: Tia Alert, MD;  Location: [facility];  Service: Neurosurgery;  Laterality: N/A;  ANTERIOR CERVICAL DECOMPRESSION/DISCECTOMY FUSION 1 LEVEL CERVICAL 5-6   BREAST LUMPECTOMY Right 04/03/2013   BREAST LUMPECTOMY WITH NEEDLE LOCALIZATION AND AXILLARY SENTINEL LYMPH NODE BX Right 04/03/2013   Procedure: BREAST LUMPECTOMY WITH NEEDLE LOCALIZATION AND AXILLARY SENTINEL LYMPH NODE BX;  Surgeon: Robyne Askew, MD;  Location: [facility];  Service: General;  Laterality: Right;   CHOLECYSTECTOMY  1994   COLONOSCOPY  09/17/2008   ZOX:WRUE tortuous, but otherwise normal-appearing colon/. Scattered diverticula   COLONOSCOPY N/A 05/31/2018   Procedure: COLONOSCOPY;  Surgeon: Corbin Ade, MD;  Location: [facility];  Service: Endoscopy;  Laterality: N/A;  10:45am   ESOPHAGOGASTRODUODENOSCOPY  09/17/2008   RMR:Two mid esophageal diverticula/Small benign cystic mucosal lesions distal esophagus of doubtful  clinical significance, stable for least 5 years/ Small hiatal hernia, otherwise normal stomach D1, D2   ESOPHAGOGASTRODUODENOSCOPY    02/10/2003   AVW:UJWJXB esophageal 3 cystic lesions without luminal compromise or evidence/Nonerosive antral gastritis/The esophagus was dilated by passing 56 [place] Maloney dilator.Marland Kitchen Epic notes states +H.pylori gastritis. treatment completed per epic notes.    ESOPHAGOGASTRODUODENOSCOPY N/A 05/31/2018   Procedure: ESOPHAGOGASTRODUODENOSCOPY (EGD);  Surgeon: Corbin Ade, MD;  Location: [facility];  Service: Endoscopy;  Laterality: N/A;   ESOPHAGOGASTRODUODENOSCOPY (EGD) WITH ESOPHAGEAL DILATION N/A 02/03/2013   JYN:WGNFAO esophageal duplication cyst. Small  esophageal diverticulum. Otherwise;  EGD normal - Widely patent tubular esophagus before and after dilation.  Status post passage of  a  Maloney dilator.   EUS  Aug 2011   Dr. Margaretha Glassing: EGD with multiple lower esophageal submucosal nodules, stomach and duodenum normal, EUS with esophageal duplication cysts   MALONEY DILATION N/A 05/31/2018   Procedure: Elease Hashimoto DILATION;  Surgeon: Corbin Ade, MD;  Location: [facility];  Service: Endoscopy;  Laterality: N/A;   POLYPECTOMY  05/31/2018  Procedure: POLYPECTOMY;  Surgeon: Corbin Ade, MD;  Location: [facility];  Service: Endoscopy;;  colon   THYROID SURGERY  2011    Family History family history includes Breast cancer (age of onset: 8) in her daughter; Cancer in her father; Diabetes in her brother and brother; Hypertension in her brother and brother; Lupus in her sister; Multiple sclerosis in her mother.  Social History Social History   Socioeconomic History   Marital status: Married    Spouse name: Joe   Number of children: 3   Years of education: 11   Highest education level: Not on file  Occupational History   Occupation: Retired  Tobacco Use   Smoking status: Never   Smokeless tobacco: Never  Vaping Use   Vaping status: Never Used  Substance and Sexual Activity   Alcohol use: No   Drug use: No   Sexual activity: Yes    Birth control/protection: Surgical    Comment: menarche age 5, P1, first live birth age 25, menopause 59, no HRT, HYST  Other Topics Concern   Not on file  Social History Narrative   Patient lives at home with her husband Margean Korell.    Patient has 3 children.    Patient is retired.    Patient has an 11th grade education.          Social Drivers of Corporate investment banker Strain: Not on file  Food Insecurity: Not on file  Transportation Needs: Not on file  Physical Activity: Not on file  Stress: Not on file  Social Connections: Not on file  Intimate Partner Violence: Not on file     Laboratory Investigations Lab Results  Component Value Date   TSH 0.33 (L) 01/09/2023   TSH 1.430 10/22/2006   FREET4 1.4 01/09/2023   FREET4 0.95 10/22/2006     Lab Results  Component Value Date   TSI <89 01/09/2023     No components found for: "TRAB"   No results found for: "CHOL" No results found for: "HDL" No results found for: "LDLCALC" No results found for: "TRIG" No results found for: "CHOLHDL" Lab Results  Component Value Date   CREATININE 0.80 10/28/2019   No results found for: "GFR"    Component Value Date/Time   NA 137 10/28/2019 1234   NA 141 03/05/2013 0818   K 3.3 (L) 10/28/2019 1234   K 3.1 (L) 03/05/2013 0818   CL 100 10/28/2019 1234   CO2 24 10/28/2019 1234   CO2 28 03/05/2013 0818   GLUCOSE 84 10/28/2019 1234   GLUCOSE 108 03/05/2013 0818   BUN 9 10/28/2019 1234   BUN 8.6 03/05/2013 0818   CREATININE 0.80 10/28/2019 1234   CREATININE 0.8 03/05/2013 0818   CALCIUM 9.0 10/28/2019 1234   CALCIUM 9.3 03/05/2013 0818   PROT 7.9 10/28/2019 1234   PROT 7.4 03/05/2013 0818   ALBUMIN 4.1 10/28/2019 1234   ALBUMIN 3.8 03/05/2013 0818   AST 29 10/28/2019 1234   AST 18 03/05/2013 0818   ALT 24 10/28/2019 1234   ALT 17 03/05/2013 0818   ALKPHOS 54 10/28/2019 1234   ALKPHOS 65 03/05/2013 0818   BILITOT 0.9 10/28/2019 1234   BILITOT 0.51 03/05/2013 0818   GFRNONAA >60 10/28/2019 1234   GFRAA >60 10/28/2019 1234      Latest Ref Rng & Units 10/28/2019   12:34 PM 05/12/2018    7:42 PM 04/09/2018   10:57 PM  BMP  Glucose 70 - 99 mg/dL  84  119  112   BUN 8 - 23 mg/dL 9  8  9    Creatinine 0.44 - 1.00 mg/dL 9.62  9.52  8.41   Sodium 135 - 145 mmol/L 137  127  131   Potassium 3.5 - 5.1 mmol/L 3.3  2.9  3.4   Chloride 98 - 111 mmol/L 100  93  97   CO2 22 - 32 mmol/L 24  25  22    Calcium 8.9 - 10.3 mg/dL 9.0  8.7  8.8        Component Value Date/Time   WBC 7.7 10/28/2019 1234   RBC 4.48 10/28/2019 1234   HGB 12.8 10/28/2019 1234   HGB 12.3  03/05/2013 0818   HCT 42.2 10/28/2019 1234   HCT 38.4 03/05/2013 0818   PLT 205 10/28/2019 1234   PLT 279 03/05/2013 0818   MCV 94.2 10/28/2019 1234   MCV 87.2 03/05/2013 0818   MCH 28.6 10/28/2019 1234   MCHC 30.3 10/28/2019 1234   RDW 12.9 10/28/2019 1234   RDW 13.9 03/05/2013 0818   LYMPHSABS 2.5 05/12/2018 1942   LYMPHSABS 1.9 03/05/2013 0818   MONOABS 0.7 05/12/2018 1942   MONOABS 0.4 03/05/2013 0818   EOSABS 0.1 05/12/2018 1942   EOSABS 0.1 03/05/2013 0818   BASOSABS 0.0 05/12/2018 1942   BASOSABS 0.0 03/05/2013 0818      Parts of this note may have been dictated using voice recognition software. There may be variances in spelling and vocabulary which are unintentional. Not all errors are proofread. Please notify the There[facility] Parkin if any discrepancies are noted or if the meaning of any statement is not clear.

## 2023-04-16 NOTE — Telephone Encounter (Signed)
 At checkout patient asked if she can get a refill of levothyroxine (SYNTHROID) 75 MCG tablet sent to [id number] - [place], [place] - [address] AT [facility]  . She forgot to ask Dr Roosevelt Locks

## 2023-04-18 DIAGNOSIS — R3129 Other microscopic hematuria: Secondary | ICD-10-CM | POA: Diagnosis not present

## 2023-04-18 DIAGNOSIS — I10 Essential (primary) hypertension: Secondary | ICD-10-CM | POA: Diagnosis not present

## 2023-05-01 DIAGNOSIS — D0510 Intraductal carcinoma in situ of unspecified breast: Secondary | ICD-10-CM | POA: Diagnosis not present

## 2023-05-01 DIAGNOSIS — C50411 Malignant neoplasm of upper-outer quadrant of right female breast: Secondary | ICD-10-CM | POA: Diagnosis not present

## 2023-05-01 DIAGNOSIS — Z170 Estrogen receptor positive status [ER+]: Secondary | ICD-10-CM | POA: Diagnosis not present

## 2023-05-15 ENCOUNTER — Other Ambulatory Visit: Payer: Self-pay

## 2023-05-15 DIAGNOSIS — E890 Postprocedural hypothyroidism: Secondary | ICD-10-CM

## 2023-05-15 MED ORDER — LEVOTHYROXINE SODIUM 75 MCG PO TABS: 75.0000 ug | ORAL_TABLET | Freq: Every day | ORAL | 0 refills | Status: DC

## 2023-05-15 NOTE — Telephone Encounter (Signed)
 Requested Prescriptions   Pending Prescriptions Disp Refills   levothyroxine (SYNTHROID) 75 MCG tablet 90 tablet 0    Sig: Take 1 tablet (75 mcg total) by mouth daily before breakfast.

## 2023-06-08 ENCOUNTER — Ambulatory Visit
Admission: RE | Admit: 2023-06-08 | Discharge: 2023-06-08 | Disposition: A | Discharge status: Home / Self Care | Payer: Medicare PPO | Source: Ambulatory Visit | Attending: Oncology | Admitting: Oncology

## 2023-06-08 DIAGNOSIS — Z1231 Encounter for screening mammogram for malignant neoplasm of breast: Secondary | ICD-10-CM | POA: Diagnosis not present

## 2023-06-15 ENCOUNTER — Ambulatory Visit: Admitting: Urology

## 2023-06-15 ENCOUNTER — Encounter: Payer: Self-pay | Admitting: Urology

## 2023-06-15 VITALS — BP 138/80 | HR 67

## 2023-06-15 DIAGNOSIS — R3129 Other microscopic hematuria: Secondary | ICD-10-CM | POA: Diagnosis not present

## 2023-06-15 LAB — URINALYSIS, ROUTINE W REFLEX MICROSCOPIC
Bilirubin, UA: NEGATIVE
Glucose, UA: NEGATIVE
Ketones, UA: NEGATIVE
Leukocytes,UA: NEGATIVE
Nitrite, UA: NEGATIVE
Protein,UA: NEGATIVE
Specific Gravity, UA: 1.01 (ref 1.005–1.030)
Urobilinogen, Ur: 0.2 mg/dL (ref 0.2–1.0)
pH, UA: 6.5 (ref 5.0–7.5)

## 2023-06-15 LAB — MICROSCOPIC EXAMINATION
Bacteria, UA: NONE SEEN
WBC, UA: NONE SEEN /HPF (ref 0–5)

## 2023-06-15 NOTE — Progress Notes (Signed)
 06/15/2023 11:00 AM   [name] January 16, 1951 [id number]  Referring provider: Artemisa Bile, MD [address] [place],  [place] [address]  hematuria   HPI: Ms [name] is a 73yo here for evaluation of microhematuria. She had multiple UAs over the past 7 years which showed trace blood. She denies gross hematuria. She denies frequent UTI. No hx of nephrolithiasis. No exposure risks. UA today shows trace blood but microscopy is negative for RBCs   PMH: Past Medical History:  Diagnosis Date   Arthritis    Breast cancer (HCC) 02/19/13   Chronic back pain    reason unknown   Constipation    doesn't take any medss   Diverticulosis    Headache(784.0)    occasionally   History of bladder infections    History of blood transfusion    no abnormal reaction noted   History of colon polyps    Hx of radiation therapy 05/13/13- 06/16/13   right breast 5000 cGy in 25 sessions   Hypercholesteremia    takes Crestor daily   Hypertension    takes HCTZ daily   Hypothyroid    takes Synthroid  daily   Insomnia    takes Ambien nightly as needed   Neck pain 05/13/2012   HNP   Neuropathy    Vertigo    doesn't take any meds    Surgical History: Past Surgical History:  Procedure Laterality Date   ABDOMINAL HYSTERECTOMY  1983   partial   ANTERIOR CERVICAL DECOMP/DISCECTOMY FUSION N/A 03/26/2014   Procedure: ANTERIOR CERVICAL DECOMPRESSION/DISCECTOMY FUSION 1 LEVEL;  Surgeon: Isadora Mar, MD;  Location: [facility];  Service: Neurosurgery;  Laterality: N/A;  ANTERIOR CERVICAL DECOMPRESSION/DISCECTOMY FUSION 1 LEVEL CERVICAL 5-6   BREAST LUMPECTOMY Right 04/03/2013   BREAST LUMPECTOMY WITH NEEDLE LOCALIZATION AND AXILLARY SENTINEL LYMPH NODE BX Right 04/03/2013   Procedure: BREAST LUMPECTOMY WITH NEEDLE LOCALIZATION AND AXILLARY SENTINEL LYMPH NODE BX;  Surgeon: Mayme Spearman, MD;  Location: [facility];  Service: General;  Laterality: Right;   CHOLECYSTECTOMY  1994   COLONOSCOPY  09/17/2008    XBJ:YNWG tortuous, but otherwise normal-appearing colon/. Scattered diverticula   COLONOSCOPY N/A 05/31/2018   Procedure: COLONOSCOPY;  Surgeon: Suzette Espy, MD;  Location: [facility];  Service: Endoscopy;  Laterality: N/A;  10:45am   ESOPHAGOGASTRODUODENOSCOPY  09/17/2008   RMR:Two mid esophageal diverticula/Small benign cystic mucosal lesions distal esophagus of doubtful  clinical significance, stable for least 5 years/ Small hiatal hernia, otherwise normal stomach D1, D2   ESOPHAGOGASTRODUODENOSCOPY    02/10/2003   NFA:OZHYQM esophageal 3 cystic lesions without luminal compromise or evidence/Nonerosive antral gastritis/The esophagus was dilated by passing 56 [place] Maloney dilator.Aaron Aas Epic notes states +H.pylori gastritis. treatment completed per epic notes.    ESOPHAGOGASTRODUODENOSCOPY N/A 05/31/2018   Procedure: ESOPHAGOGASTRODUODENOSCOPY (EGD);  Surgeon: Suzette Espy, MD;  Location: [facility];  Service: Endoscopy;  Laterality: N/A;   ESOPHAGOGASTRODUODENOSCOPY (EGD) WITH ESOPHAGEAL DILATION N/A 02/03/2013   VHQ:IONGEX esophageal duplication cyst. Small esophageal diverticulum. Otherwise;  EGD normal - Widely patent tubular esophagus before and after dilation.  Status post passage of  a  Maloney dilator.   EUS  Aug 2011   Dr. Rowena Copa: EGD with multiple lower esophageal submucosal nodules, stomach and duodenum normal, EUS with esophageal duplication cysts   MALONEY DILATION N/A 05/31/2018   Procedure: Londa Rival DILATION;  Surgeon: Suzette Espy, MD;  Location: [facility];  Service: Endoscopy;  Laterality: N/A;   POLYPECTOMY  05/31/2018   Procedure: POLYPECTOMY;  Surgeon: Suzette Espy, MD;  Location: [facility];  Service: Endoscopy;;  colon   THYROID  SURGERY  2011    Home Medications:  Allergies as of 06/15/2023       Reactions   Oxycodone  Hcl    Other reaction(s): Unknown   Tramadol  Nausea Only   Codeine Nausea And Vomiting   Percocet [oxycodone -acetaminophen ] Rash         Medication List        Accurate as of Jun 15, 2023 11:00 AM. If you have any questions, ask your nurse or doctor.          STOP taking these medications    amoxicillin -clavulanate 875-125 MG tablet Commonly known as: AUGMENTIN    cefdinir 300 MG capsule Commonly known as: OMNICEF   cephALEXin  500 MG capsule Commonly known as: KEFLEX    dicyclomine  20 MG tablet Commonly known as: BENTYL    fluticasone  50 MCG/ACT nasal spray Commonly known as: FLONASE    GaviLyte-N with Flavor Pack 420 g solution Generic drug: polyethylene glycol-electrolytes   HYDROcodone  bit-homatropine 5-1.5 MG/5ML syrup Commonly known as: HYCODAN   HYDROcodone -acetaminophen  5-325 MG tablet Commonly known as: NORCO/VICODIN   levocetirizine 5 MG tablet Commonly known as: XYZAL   meloxicam 15 MG tablet Commonly known as: MOBIC   meloxicam 7.5 MG tablet Commonly known as: MOBIC   methocarbamol  500 MG tablet Commonly known as: ROBAXIN    nitrofurantoin  (macrocrystal-monohydrate) 100 MG capsule Commonly known as: MACROBID    omeprazole 20 MG capsule Commonly known as: PRILOSEC   rizatriptan  10 MG disintegrating tablet Commonly known as: MAXALT -MLT   ropivacaine (PF) 5 mg/mL (0.5%) 5 MG/ML injection Commonly known as: NAROPIN   traMADol  50 MG tablet Commonly known as: ULTRAM    traZODone 50 MG tablet Commonly known as: DESYREL   triamcinolone  acetonide 40 MG/ML Susp Commonly known as: TRIESENCE   zolpidem 5 MG tablet Commonly known as: AMBIEN       TAKE these medications    amLODipine 2.5 MG tablet Commonly known as: NORVASC Take 2.5 mg by mouth daily. What changed: Another medication with the same name was removed. Continue taking this medication, and follow the directions you see here.   gabapentin  100 MG capsule Commonly known as: NEURONTIN  Take by mouth. What changed: Another medication with the same name was removed. Continue taking this medication, and follow the  directions you see here.   hydrochlorothiazide  25 MG tablet Commonly known as: HYDRODIURIL  Take 25 mg by mouth daily.   levothyroxine  75 MCG tablet Commonly known as: SYNTHROID  Take 1 tablet (75 mcg total) by mouth daily before breakfast.   lidocaine  5 % Commonly known as: LIDODERM    pantoprazole  40 MG tablet Commonly known as: Protonix  Take 1 tablet (40 mg total) by mouth daily. What changed: Another medication with the same name was removed. Continue taking this medication, and follow the directions you see here.   potassium chloride  SA 20 MEQ tablet Commonly known as: KLOR-CON  M Take 20 mEq by mouth daily.   Restasis 0.05 % ophthalmic emulsion Generic drug: cycloSPORINE 1 drop 2 (two) times daily.   rosuvastatin 5 MG tablet Commonly known as: CRESTOR Take 1 tablet by mouth daily. What changed: Another medication with the same name was removed. Continue taking this medication, and follow the directions you see here.        Allergies:  Allergies  Allergen Reactions   Oxycodone  Hcl     Other reaction(s): Unknown   Tramadol  Nausea Only  Codeine Nausea And Vomiting   Percocet [Oxycodone -Acetaminophen ] Rash    Family History: Family History  Problem Relation Age of Onset   Lupus Sister    Hypertension Brother    Diabetes Brother    Hypertension Brother    Diabetes Brother    Multiple sclerosis Mother    Cancer Father        "stomach"   Breast cancer Daughter 54   Colon cancer Neg Hx     Social History:  reports that she has never smoked. She has never used smokeless tobacco. She reports that she does not drink alcohol and does not use drugs.  ROS: All other review of systems were reviewed and are negative except what is noted above in HPI  Physical Exam: BP 138/80   Pulse 67   Constitutional:  Alert and oriented, No acute distress. HEENT: [place] AT, moist mucus membranes.  Trachea midline, no masses. Cardiovascular: No clubbing, cyanosis, or  edema. Respiratory: Normal respiratory effort, no increased work of breathing. GI: Abdomen is soft, nontender, nondistended, no abdominal masses GU: No CVA tenderness.  Lymph: No cervical or inguinal lymphadenopathy. Skin: No rashes, bruises or suspicious lesions. Neurologic: Grossly intact, no focal deficits, moving all 4 extremities. Psychiatric: Normal mood and affect.  Laboratory Data: Lab Results  Component Value Date   WBC 7.7 10/28/2019   HGB 12.8 10/28/2019   HCT 42.2 10/28/2019   MCV 94.2 10/28/2019   PLT 205 10/28/2019    Lab Results  Component Value Date   CREATININE 0.80 10/28/2019    No results found for: "PSA"  No results found for: "TESTOSTERONE"  No results found for: "HGBA1C"  Urinalysis    Component Value Date/Time   COLORURINE YELLOW 10/28/2019 1253   APPEARANCEUR CLEAR 10/28/2019 1253   LABSPEC 1.005 10/28/2019 1253   PHURINE 6.0 10/28/2019 1253   GLUCOSEU NEGATIVE 10/28/2019 1253   HGBUR SMALL (A) 10/28/2019 1253   BILIRUBINUR NEGATIVE 10/28/2019 1253   BILIRUBINUR neg 08/24/2011 1111   KETONESUR NEGATIVE 10/28/2019 1253   PROTEINUR NEGATIVE 10/28/2019 1253   UROBILINOGEN 0.2 11/20/2012 1624   NITRITE NEGATIVE 10/28/2019 1253   LEUKOCYTESUR NEGATIVE 10/28/2019 1253    Lab Results  Component Value Date   BACTERIA NONE SEEN 10/28/2019    Pertinent Imaging:  Results for orders placed during the hospital encounter of 12/29/06  DG Abd 1 View  Narrative Clinical Data: 73 year old with left upper quadrant abdominal pain. ABDOMEN - 1 VIEW: Comparison: None. Findings: Minimal scattered air and stool in the colon. No dilated loops of small bowel to suggest obstruction. No definite free air is seen. The soft tissue shadows of the abdomen are maintained. Numerous phlebolithic calcifications are noted in the pelvis. Surgical clips in the right upper quadrant are noted. No acute bony findings.  Impression No plain film evidence of acute  abdominal process.  Provider: Alfonza Angry  No results found for this or any previous visit.  No results found for this or any previous visit.  No results found for this or any previous visit.  Results for orders placed during the hospital encounter of 12/13/18  US  RENAL  Narrative CLINICAL DATA:  Initial evaluation for microscopic hematuria.  EXAM: RENAL / URINARY TRACT ULTRASOUND COMPLETE  COMPARISON:  Prior CT from 05/12/2018.  FINDINGS: Right Kidney:  Renal measurements: 11.2 x 5.2 x 6.7 cm = volume: 203.6 mL. Echogenicity within normal limits. No mass or hydronephrosis visualized. No shadowing echogenic foci to suggest nephrolithiasis.  Left Kidney:  Renal  measurements: 10.7 x 4.5 x 5.8 cm = volume: 146.2 mL. Echogenicity within normal limits. No mass or hydronephrosis visualized. No shadowing echogenic foci to suggest nephrolithiasis.  Bladder:  Appears normal for degree of bladder distention.  Other:  None.  IMPRESSION: Normal renal ultrasound. No sonographic evidence for nephrolithiasis or obstructive uropathy.   Electronically Signed By: Virgia Griffins M.D. On: 12/13/2018 14:12  No results found for this or any previous visit.  No results found for this or any previous visit.  Results for orders placed during the hospital encounter of 05/12/18  CT Renal Stone Study  Narrative CLINICAL DATA:  Right flank pain.  EXAM: CT ABDOMEN AND PELVIS WITHOUT CONTRAST  TECHNIQUE: Multidetector CT imaging of the abdomen and pelvis was performed following the standard protocol without IV contrast.  COMPARISON:  CT scan of December 29, 2017.  FINDINGS: Lower chest: No acute abnormality.  Hepatobiliary: No focal liver abnormality is seen. Status post cholecystectomy. No biliary dilatation.  Pancreas: Unremarkable. No pancreatic ductal dilatation or surrounding inflammatory changes.  Spleen: Calcified splenic granulomata are  noted.  Adrenals/Urinary Tract: Adrenal glands are unremarkable. Kidneys are normal, without renal calculi, focal lesion, or hydronephrosis. Bladder is unremarkable.  Stomach/Bowel: Stomach is within normal limits. Appendix appears normal. No evidence of bowel wall thickening, distention, or inflammatory changes. Diverticulosis of descending colon is noted without inflammation.  Vascular/Lymphatic: No significant vascular findings are present. No enlarged abdominal or pelvic lymph nodes.  Reproductive: Status post hysterectomy. No adnexal masses.  Other: No abdominal wall hernia or abnormality. No abdominopelvic ascites.  Musculoskeletal: No acute or significant osseous findings.  IMPRESSION: No acute abnormality seen in the abdomen or pelvis.   Electronically Signed By: Rosalene Colon, M.D. On: 05/12/2018 20:16   Assessment & Plan:    1. Microscopic hematuria (Primary) -We discussed a natural hx of microhematuria and the various causes. We discussed the false positive rates of urine dipsticks and the need for microscopy confirmation. Since her microscopies have been negative for RBCs she does not require hematuria workup at this time. Followup 6 months with UA. If UA is normal I will see her back prn - Urinalysis, Routine w reflex microscopic   No follow-ups on file.  Johnie Nailer, MD  [facility] [place]

## 2023-06-15 NOTE — Patient Instructions (Signed)
 Blood in the Pee (Hematuria) in Adults: What to Know  Hematuria is blood in the pee. You may be able to see blood in the pee. In some cases, a health care provider may find blood with a test.  Blood in the pee can be caused by infections of the kidney, bladder, or the urethra. The urethra is the tube that drains pee from the bladder.  Other causes may include: Kidney stones. Infection of the prostate. Cancer. Too much calcium in the pee. Conditions that are passed from parent to child. Too much exercise. Infections can be treated with medicine. A kidney stone will usually leave your body when you pee. If infections or kidney stones didn't cause the blood in the urine, then more tests may be needed. It is very important to tell your provider about any blood in your pee, even if you have no pain or the blood stops with no treatment. Blood in the pee can be a sign of a very serious problem, such as cancer. Follow these instructions at home: Medicines Take your medicines only as told. If you were given antibiotics, take them as told. Do not stop taking them even if you start to feel better. Eating and drinking Drink more fluids as told. Aim to drink 3-4 quarts (2.8-3.8 L) a day. Avoid caffeine, tea, and carbonated drinks. These can bother the bladder. Avoid alcohol if a female because it may irritate the prostate. General instructions If you have been diagnosed with a kidney stone, strain your pee to catch the stone if told by your provider. Empty your bladder often. Avoid holding pee for a long time. If you're female, make sure that: You wipe from front to back after using the bathroom. You use each piece of toilet paper only once. You pee before and after sex. It's up to you to get the results of any tests. Ask when your results will be ready and how to get them. You may need to call or meet with your provider to get your results. Keep all follow-up visits. Your provider will need to know  about any changes or any new symptoms. Contact a health care provider if: Your symptoms don't get better after 3 days. Your symptoms get worse. You have back pain or belly pain. You have a fever or chills. You throw up or feel like you may throw up. You throw up every time you take medicine. Get help right away if: You pass blood clots in your pee. You pass out. These symptoms may be an emergency. Call 911 right away. Do not wait to see if the symptoms will go away. Do not drive yourself to the hospital. This information is not intended to replace advice given to you by your health care provider. Make sure you discuss any questions you have with your health care provider. Document Revised: 11/02/2022 Document Reviewed: 10/12/2022 Elsevier Patient Education  2024 [employer].

## 2023-06-21 DIAGNOSIS — L700 Acne vulgaris: Secondary | ICD-10-CM | POA: Diagnosis not present

## 2023-06-21 DIAGNOSIS — D2339 Other benign neoplasm of skin of other parts of face: Secondary | ICD-10-CM | POA: Diagnosis not present

## 2023-07-13 DIAGNOSIS — I10 Essential (primary) hypertension: Secondary | ICD-10-CM | POA: Diagnosis not present

## 2023-07-13 DIAGNOSIS — Z79899 Other long term (current) drug therapy: Secondary | ICD-10-CM | POA: Diagnosis not present

## 2023-07-13 DIAGNOSIS — E039 Hypothyroidism, unspecified: Secondary | ICD-10-CM | POA: Diagnosis not present

## 2023-07-20 DIAGNOSIS — E785 Hyperlipidemia, unspecified: Secondary | ICD-10-CM | POA: Diagnosis not present

## 2023-07-20 DIAGNOSIS — I10 Essential (primary) hypertension: Secondary | ICD-10-CM | POA: Diagnosis not present

## 2023-07-23 ENCOUNTER — Other Ambulatory Visit (HOSPITAL_COMMUNITY): Payer: Self-pay | Admitting: Internal Medicine

## 2023-07-23 DIAGNOSIS — Z13820 Encounter for screening for osteoporosis: Secondary | ICD-10-CM

## 2023-08-02 ENCOUNTER — Ambulatory Visit (HOSPITAL_COMMUNITY)
Admission: RE | Admit: 2023-08-02 | Discharge: 2023-08-02 | Disposition: A | Discharge status: Home / Self Care | Source: Ambulatory Visit | Attending: Internal Medicine | Admitting: Internal Medicine

## 2023-08-02 DIAGNOSIS — Z780 Asymptomatic menopausal state: Secondary | ICD-10-CM | POA: Insufficient documentation

## 2023-08-02 DIAGNOSIS — S6391XA Sprain of unspecified part of right wrist and hand, initial encounter: Secondary | ICD-10-CM | POA: Diagnosis not present

## 2023-08-02 DIAGNOSIS — Z13820 Encounter for screening for osteoporosis: Secondary | ICD-10-CM | POA: Diagnosis not present

## 2023-08-02 DIAGNOSIS — R030 Elevated blood-pressure reading, without diagnosis of hypertension: Secondary | ICD-10-CM | POA: Diagnosis not present

## 2023-08-02 DIAGNOSIS — E663 Overweight: Secondary | ICD-10-CM | POA: Diagnosis not present

## 2023-08-02 DIAGNOSIS — Z6829 Body mass index (BMI) 29.0-29.9, adult: Secondary | ICD-10-CM | POA: Diagnosis not present

## 2023-08-14 ENCOUNTER — Other Ambulatory Visit

## 2023-08-14 DIAGNOSIS — E890 Postprocedural hypothyroidism: Secondary | ICD-10-CM | POA: Diagnosis not present

## 2023-08-15 LAB — TSH(REFL): TSH: 1.14 m[IU]/L (ref 0.40–4.50)

## 2023-08-15 LAB — REFLEX TIQ

## 2023-08-16 ENCOUNTER — Encounter: Payer: Self-pay | Admitting: "Endocrinology

## 2023-08-16 NOTE — Progress Notes (Unsigned)
 The patient reports they are currently: [name]. I spent 7-8 minutes on the video with the patient on the date of service. I spent an additional 5 minutes on pre- and post-visit activities on the date of service.   The patient was physically located in [place]  or a state in which I am permitted to provide care. The patient and/or parent/guardian understood that s/he may incur co-pays and cost sharing, and agreed to the telemedicine visit. The visit was reasonable and appropriate under the circumstances given the patient's presentation at the time.  The patient and/or parent/guardian has been advised of the potential risks and limitations of this mode of treatment (including, but not limited to, the absence of in-person examination) and has agreed to be treated using telemedicine. The patient's/patient's family's questions regarding telemedicine have been answered.   The patient and/or parent/guardian has also been advised to contact their provider's office for worsening conditions, and seek emergency medical treatment and/or call 911 if the patient deems either necessary.     Outpatient Endocrinology Note Maria Birmingham, MD  08/17/23   [name] [birth year] [id number]  Referring Provider: [name], MD Primary Care Provider: [name], MD Subjective  No chief complaint on file.   Assessment & Plan  Diagnoses and all orders for this visit:  Postsurgical hypothyroidism -     TSH + free T4  Overweight   [name] is currently taking synthroid  75 mcg MON-SAT, skip SUNDAYS. Reports she cannot take generic levothyroxine . History of postablative and postsurgical hypothyroidism  Patient is currently biochemically euthyroid. Educated on thyroid  axis.  Recommend the following: Take synthroid  75 mcg MON-SAT, skip SUNDAYS.  Advised to take levothyroxine  first thing in the morning on empty stomach and wait at least 30 minutes to 1 hour before eating or drinking anything or  taking any other medications. Space out levothyroxine  by 4 hours from any acid reflux medication/fibrate/iron/calcium/multivitamin.  Repeat lab before next visit or sooner if symptoms of hyperthyroidism or hypothyroidism develop.  Notify us  immediately in case of significant weight gain or loss. Counseled on compliance and follow up needs.  Pt interested in weight loss Discussed lifestyle changes Maintain healthy lifestyle including 1200-1500 Cal/day, 30 min of activity/day, avoiding refined/processed/outside food 20 minutes physical activity per day, in continuum or interruptedly through the day  Goals: less than 60 grams of carbohydrate/meal, 1200-1500 Cal/day, 10,0000 steps a day and weight loss of 0.5-1 lb/ wk  Sleep 7-9 hours/day, adapt good sleep hygiene Adapt de-stressing and relaxation techniques to prevent stress induced weight gain Avoid/switch medications that lead to weight gain by discussing with the prescribing physician   I have reviewed current medications, nurse's notes, allergies, vital signs, past medical and surgical history, family medical history, and social history for this encounter. Counseled patient on symptoms, examination findings, lab findings, imaging results, treatment decisions and monitoring and prognosis. The patient understood the recommendations and agrees with the treatment plan. All questions regarding treatment plan were fully answered.   Return in about 6 months (around 02/17/2024) for visit + labs before next visit, visit and 8 am labs before next visit.   Maria Birmingham, MD  08/17/23   I have reviewed current medications, nurse's notes, allergies, vital signs, past medical and surgical history, family medical history, and social history for this encounter. Counseled patient on symptoms, examination findings, lab findings, imaging results, treatment decisions and monitoring and prognosis. The patient understood the recommendations and agrees with the  treatment plan. All questions regarding treatment  plan were fully answered.   History of Present Illness [name] is a 73 y.o. year old female who presents to our clinic with low TSH diagnosed in 12/2022.    Symptoms suggestive of HYPOTHYROIDISM:  fatigue No weight gain No cold intolerance  No constipation  No  Symptoms suggestive of HYPERTHYROIDISM:  weight loss  No heat intolerance No hyperdefecation  No palpitations  No  Compressive symptoms:  dysphagia  No dysphonia  No positional dyspnea (especially with simultaneous arms elevation)  No s/p total thyroidectomy in 2011 for dysphagia   Smokes  No On biotin  No Personal history of head/neck surgery/irradiation  Yes   Initial history:       Physical Exam  Ht 5' 6 (1.676 m)   Wt 171 lb (77.6 kg)   BMI 27.60 kg/m  Constitutional: well developed, well nourished Head: normocephalic, atraumatic, no exophthalmos Eyes: sclera anicteric, no redness Neck: no thyromegaly, no thyroid  tenderness; no nodules palpated Lungs: normal respiratory effort Neurology: alert and oriented, no fine hand tremor Skin: dry, no appreciable rashes Musculoskeletal: no appreciable defects Psychiatric: normal mood and affect  Allergies Allergies  Allergen Reactions   Oxycodone  Hcl     Other reaction(s): Unknown   Tramadol  Nausea Only   Codeine Nausea And Vomiting   Percocet [Oxycodone -Acetaminophen ] Rash    Current Medications Patient's Medications  New Prescriptions   No medications on file  Previous Medications   AMLODIPINE (NORVASC) 2.5 MG TABLET    Take 2.5 mg by mouth daily.   GABAPENTIN  (NEURONTIN ) 100 MG CAPSULE    Take by mouth.   HYDROCHLOROTHIAZIDE  (HYDRODIURIL ) 25 MG TABLET    Take 25 mg by mouth daily.     LEVOTHYROXINE  (SYNTHROID ) 75 MCG TABLET    Take 1 tablet (75 mcg total) by mouth daily before breakfast.   LIDOCAINE  (LIDODERM ) 5 %       PANTOPRAZOLE  (PROTONIX ) 40 MG TABLET    Take 1 tablet (40 mg total)  by mouth daily.   POTASSIUM CHLORIDE  SA (K-DUR,KLOR-CON ) 20 MEQ TABLET    Take 20 mEq by mouth daily.   RESTASIS 0.05 % OPHTHALMIC EMULSION    1 drop 2 (two) times daily.   ROSUVASTATIN (CRESTOR) 5 MG TABLET    Take 1 tablet by mouth daily.  Modified Medications   No medications on file  Discontinued Medications   No medications on file    Past Medical History Past Medical History:  Diagnosis Date   Arthritis    Breast cancer (HCC) 02/19/13   Chronic back pain    reason unknown   Constipation    doesn't take any medss   Diverticulosis    Headache(784.0)    occasionally   History of bladder infections    History of blood transfusion    no abnormal reaction noted   History of colon polyps    Hx of radiation therapy 05/13/13- 06/16/13   right breast 5000 cGy in 25 sessions   Hypercholesteremia    takes Crestor daily   Hypertension    takes HCTZ daily   Hypothyroid    takes Synthroid  daily   Insomnia    takes Ambien nightly as needed   Neck pain 05/13/2012   HNP   Neuropathy    Vertigo    doesn't take any meds    Past Surgical History Past Surgical History:  Procedure Laterality Date   ABDOMINAL HYSTERECTOMY  1983   partial   ANTERIOR CERVICAL DECOMP/DISCECTOMY FUSION N/A 03/26/2014  Procedure: ANTERIOR CERVICAL DECOMPRESSION/DISCECTOMY FUSION 1 LEVEL;  Surgeon: [name], MD;  Location: [facility];  Service: Neurosurgery;  Laterality: N/A;  ANTERIOR CERVICAL DECOMPRESSION/DISCECTOMY FUSION 1 LEVEL CERVICAL 5-6   BREAST LUMPECTOMY Right 04/03/2013   BREAST LUMPECTOMY WITH NEEDLE LOCALIZATION AND AXILLARY SENTINEL LYMPH NODE BX Right 04/03/2013   Procedure: BREAST LUMPECTOMY WITH NEEDLE LOCALIZATION AND AXILLARY SENTINEL LYMPH NODE BX;  Surgeon: [name] DOUGLAS, MD;  Location: [facility];  Service: General;  Laterality: Right;   CHOLECYSTECTOMY  1994   COLONOSCOPY  09/17/2008   MFM:Onwh tortuous, but otherwise normal-appearing colon/. Scattered diverticula   COLONOSCOPY  N/A 05/31/2018   Procedure: COLONOSCOPY;  Surgeon: [name], MD;  Location: [facility];  Service: Endoscopy;  Laterality: N/A;  10:45am   ESOPHAGOGASTRODUODENOSCOPY  09/17/2008   RMR:Two mid esophageal diverticula/Small benign cystic mucosal lesions distal esophagus of doubtful  clinical significance, stable for least 5 years/ Small hiatal hernia, otherwise normal stomach D1, D2   ESOPHAGOGASTRODUODENOSCOPY    02/10/2003   WLM:Ipdujo esophageal 3 cystic lesions without luminal compromise or evidence/Nonerosive antral gastritis/The esophagus was dilated by passing 56 [place] Maloney dilator.SABRA Epic notes states +H.pylori gastritis. treatment completed per epic notes.    ESOPHAGOGASTRODUODENOSCOPY N/A 05/31/2018   Procedure: ESOPHAGOGASTRODUODENOSCOPY (EGD);  Surgeon: [name], MD;  Location: [facility];  Service: Endoscopy;  Laterality: N/A;   ESOPHAGOGASTRODUODENOSCOPY (EGD) WITH ESOPHAGEAL DILATION N/A 02/03/2013   MFM:Dujaoz esophageal duplication cyst. Small esophageal diverticulum. Otherwise;  EGD normal - Widely patent tubular esophagus before and after dilation.  Status post passage of  a  Maloney dilator.   EUS  Aug 2011   Dr. [name]: EGD with multiple lower esophageal submucosal nodules, stomach and duodenum normal, EUS with esophageal duplication cysts   MALONEY DILATION N/A 05/31/2018   Procedure: AGAPITO DILATION;  Surgeon: [name], MD;  Location: [facility];  Service: Endoscopy;  Laterality: N/A;   POLYPECTOMY  05/31/2018   Procedure: POLYPECTOMY;  Surgeon: [name], MD;  Location: [facility];  Service: Endoscopy;;  colon   THYROID  SURGERY  2011    Family History family history includes Breast cancer (age of onset: 69) in her daughter; Cancer in her father; Diabetes in her brother and brother; Hypertension in her brother and brother; Lupus in her sister; Multiple sclerosis in her mother.  Social History Social History   Socioeconomic History    Marital status: Married    Spouse name: Joe   Number of children: 3   Years of education: 11   Highest education level: Not on file  Occupational History   Occupation: Retired  Tobacco Use   Smoking status: Never   Smokeless tobacco: Never  Vaping Use   Vaping status: Never Used  Substance and Sexual Activity   Alcohol use: No   Drug use: No   Sexual activity: Yes    Birth control/protection: Surgical    Comment: menarche age 27, P68, first live birth age 20, menopause 18, no HRT, HYST  Other Topics Concern   Not on file  Social History Narrative   Patient lives at home with her husband Emilyanne Mcgough.    Patient has 3 children.    Patient is retired.    Patient has an 11th grade education.          Social Drivers of Corporate investment banker Strain: Not on file  Food Insecurity: Not on file  Transportation Needs: Not on file  Physical  Activity: Not on file  Stress: Not on file  Social Connections: Not on file  Intimate Partner Violence: Not on file    Laboratory Investigations Lab Results  Component Value Date   TSH 0.33 (L) 01/09/2023   TSH 1.430 10/22/2006   FREET4 1.4 01/09/2023   FREET4 0.95 10/22/2006     Lab Results  Component Value Date   TSI <89 01/09/2023     No components found for: TRAB   No results found for: CHOL No results found for: HDL No results found for: LDLCALC No results found for: TRIG No results found for: [facility] Lab Results  Component Value Date   CREATININE 0.80 10/28/2019   No results found for: GFR    Component Value Date/Time   NA 137 10/28/2019 1234   NA 141 03/05/2013 0818   K 3.3 (L) 10/28/2019 1234   K 3.1 (L) 03/05/2013 0818   CL 100 10/28/2019 1234   CO2 24 10/28/2019 1234   CO2 28 03/05/2013 0818   GLUCOSE 84 10/28/2019 1234   GLUCOSE 108 03/05/2013 0818   BUN 9 10/28/2019 1234   BUN 8.6 03/05/2013 0818   CREATININE 0.80 10/28/2019 1234   CREATININE 0.8 03/05/2013 0818   CALCIUM 9.0  10/28/2019 1234   CALCIUM 9.3 03/05/2013 0818   PROT 7.9 10/28/2019 1234   PROT 7.4 03/05/2013 0818   ALBUMIN 4.1 10/28/2019 1234   ALBUMIN 3.8 03/05/2013 0818   AST 29 10/28/2019 1234   AST 18 03/05/2013 0818   ALT 24 10/28/2019 1234   ALT 17 03/05/2013 0818   ALKPHOS 54 10/28/2019 1234   ALKPHOS 65 03/05/2013 0818   BILITOT 0.9 10/28/2019 1234   BILITOT 0.51 03/05/2013 0818   GFRNONAA >60 10/28/2019 1234   GFRAA >60 10/28/2019 1234      Latest Ref Rng & Units 10/28/2019   12:34 PM 05/12/2018    7:42 PM 04/09/2018   10:57 PM  BMP  Glucose 70 - 99 mg/dL 84  880  887   BUN 8 - 23 mg/dL 9  8  9    Creatinine 0.44 - 1.00 mg/dL 9.19  9.01  9.10   Sodium 135 - 145 mmol/L 137  127  131   Potassium 3.5 - 5.1 mmol/L 3.3  2.9  3.4   Chloride 98 - 111 mmol/L 100  93  97   CO2 22 - 32 mmol/L 24  25  22    Calcium 8.9 - 10.3 mg/dL 9.0  8.7  8.8        Component Value Date/Time   WBC 7.7 10/28/2019 1234   RBC 4.48 10/28/2019 1234   HGB 12.8 10/28/2019 1234   HGB 12.3 03/05/2013 0818   HCT 42.2 10/28/2019 1234   HCT 38.4 03/05/2013 0818   PLT 205 10/28/2019 1234   PLT 279 03/05/2013 0818   MCV 94.2 10/28/2019 1234   MCV 87.2 03/05/2013 0818   MCH 28.6 10/28/2019 1234   MCHC 30.3 10/28/2019 1234   RDW 12.9 10/28/2019 1234   RDW 13.9 03/05/2013 0818   LYMPHSABS 2.5 05/12/2018 1942   LYMPHSABS 1.9 03/05/2013 0818   MONOABS 0.7 05/12/2018 1942   MONOABS 0.4 03/05/2013 0818   EOSABS 0.1 05/12/2018 1942   EOSABS 0.1 03/05/2013 0818   BASOSABS 0.0 05/12/2018 1942   BASOSABS 0.0 03/05/2013 0818      Parts of this note may have been dictated using voice recognition software. There may be variances in spelling and vocabulary which are unintentional. Not  all errors are proofread. Please notify the dino if any discrepancies are noted or if the meaning of any statement is not clear.

## 2023-08-17 ENCOUNTER — Ambulatory Visit: Admitting: "Endocrinology

## 2023-08-17 ENCOUNTER — Telehealth (INDEPENDENT_AMBULATORY_CARE_PROVIDER_SITE_OTHER): Admitting: "Endocrinology

## 2023-08-17 VITALS — Ht 66.0 in | Wt 171.0 lb

## 2023-08-17 DIAGNOSIS — E663 Overweight: Secondary | ICD-10-CM | POA: Diagnosis not present

## 2023-08-17 DIAGNOSIS — E890 Postprocedural hypothyroidism: Secondary | ICD-10-CM

## 2023-08-17 MED ORDER — LEVOTHYROXINE SODIUM 75 MCG PO TABS: 75.0000 ug | ORAL_TABLET | Freq: Every day | ORAL | 1 refills | Status: DC

## 2023-08-23 DIAGNOSIS — E7800 Pure hypercholesterolemia, unspecified: Secondary | ICD-10-CM | POA: Diagnosis not present

## 2023-08-23 DIAGNOSIS — S0121XA Laceration without foreign body of nose, initial encounter: Secondary | ICD-10-CM | POA: Diagnosis not present

## 2023-08-23 DIAGNOSIS — I10 Essential (primary) hypertension: Secondary | ICD-10-CM | POA: Diagnosis not present

## 2023-08-23 DIAGNOSIS — Z853 Personal history of malignant neoplasm of breast: Secondary | ICD-10-CM | POA: Diagnosis not present

## 2023-08-23 DIAGNOSIS — Z885 Allergy status to narcotic agent status: Secondary | ICD-10-CM | POA: Diagnosis not present

## 2023-09-03 DIAGNOSIS — Z170 Estrogen receptor positive status [ER+]: Secondary | ICD-10-CM | POA: Diagnosis not present

## 2023-09-03 DIAGNOSIS — D0510 Intraductal carcinoma in situ of unspecified breast: Secondary | ICD-10-CM | POA: Diagnosis not present

## 2023-09-03 DIAGNOSIS — C50411 Malignant neoplasm of upper-outer quadrant of right female breast: Secondary | ICD-10-CM | POA: Diagnosis not present

## 2023-10-15 DIAGNOSIS — H25813 Combined forms of age-related cataract, bilateral: Secondary | ICD-10-CM | POA: Diagnosis not present

## 2023-10-24 DIAGNOSIS — H43813 Vitreous degeneration, bilateral: Secondary | ICD-10-CM | POA: Diagnosis not present

## 2023-10-24 DIAGNOSIS — H16223 Keratoconjunctivitis sicca, not specified as Sjogren's, bilateral: Secondary | ICD-10-CM | POA: Diagnosis not present

## 2023-10-24 DIAGNOSIS — H2513 Age-related nuclear cataract, bilateral: Secondary | ICD-10-CM | POA: Diagnosis not present

## 2023-11-19 ENCOUNTER — Encounter (HOSPITAL_COMMUNITY): Payer: Self-pay

## 2023-11-19 ENCOUNTER — Other Ambulatory Visit: Payer: Self-pay

## 2023-11-19 ENCOUNTER — Encounter (HOSPITAL_COMMUNITY)
Admission: RE | Admit: 2023-11-19 | Discharge: 2023-11-19 | Disposition: A | Discharge status: Home / Self Care | Source: Ambulatory Visit | Attending: Optometry | Admitting: Optometry

## 2023-11-19 DIAGNOSIS — I10 Essential (primary) hypertension: Secondary | ICD-10-CM | POA: Diagnosis not present

## 2023-11-19 DIAGNOSIS — Z23 Encounter for immunization: Secondary | ICD-10-CM | POA: Diagnosis not present

## 2023-11-19 DIAGNOSIS — E030 Congenital hypothyroidism with diffuse goiter: Secondary | ICD-10-CM | POA: Diagnosis not present

## 2023-11-19 NOTE — H&P (Signed)
 Surgical History & Physical  Patient Name: [name]  DOB: Jun 21, 1950  Surgery: Cataract extraction with intraocular lens implant phacoemulsification; Right Eye Surgeon: Marsa Cleverly MD Surgery Date: 11/23/2023 Pre-Op Date: 10/24/2023  HPI: A 83 Yr. old female patient 1. The patient here for a cataract eval. The patient complains of difficulty when recognizing people, which began 1 year ago. Both eyes are affected. The episode is constant. The condition's severity decreased since last visit. HPI was performed by Marsa Cleverly .  Medical History: Dry Eyes Cataracts  Arthritis High Blood Pressure LDL  Review of Systems Thyroid  Problems Cardiovascular High Blood Pressure Endocrine high cholesterol All recorded systems are negative except as noted above.  Social Never smoked  Medication Prednisolone-moxiflox-bromfen,  Amlodipine, Ketoconazole, Synthroid , Ibuprofen , Trazodone, Hydrochlorothiazide , Rosuvastatin  Sx/Procedures None  Drug Allergies  Percocet, Codeine  History & Physical: Heent: cataracts NECK: supple without bruits LUNGS: lungs clear to auscultation CV: regular rate and rhythm Abdomen: soft and non-tender  Impression & Plan: Assessment: 1.  CATARACT NUCLEAR SCLEROSIS AGE RELATED; Both Eyes (H25.13) 2.  KERATOCONJUNCTIVITIS SICCA NOT SPECIFIED AS SJORGRENS; Both Eyes (H16.223) 3.  Posterior Vitreous Detachment; Both Eyes (H43.813)  Plan: 1.  Cataracts are visually significant and account for the patient's complaints. Discussed all risks, benefits, procedures and recovery, including infection, loss of vision and eye, need for glasses after surgery or additional procedures. Patient understands changing glasses will not improve vision. Patient indicated understanding of procedure. All questions answered. Patient desires to have surgery, recommend phacoemulsification with intraocular lens. Patient to have preliminary testing necessary ([place]os/IOL  Master, Mac OCT, TOPO) Educational materials provided:Cataract.  Plan: - Proceed with cataract surgery OD, followed by OS - Plan for best distance target with DIB00 - No DM, no fuchs, no prior eye surgery - good dilation - Dextenza  if available  2.  Dry eye. Mild signs of dry eye at this time. Can use artificial tears QID OU PRN and warm compresses once daily as needed.  3.  No retinal tear or retinal detachment. I told the patient to contact me ASAP for new onset/worsening of floaters, flashes, or vision loss.

## 2023-11-20 DIAGNOSIS — H2511 Age-related nuclear cataract, right eye: Secondary | ICD-10-CM | POA: Diagnosis not present

## 2023-11-23 ENCOUNTER — Encounter (HOSPITAL_COMMUNITY)
Admission: RE | Disposition: A | Discharge status: Home / Self Care | Payer: Self-pay | Source: Home / Self Care | Attending: Optometry

## 2023-11-23 ENCOUNTER — Ambulatory Visit (HOSPITAL_COMMUNITY)
Admission: RE | Admit: 2023-11-23 | Discharge: 2023-11-23 | Disposition: A | Discharge status: Home / Self Care | Attending: Optometry | Admitting: Optometry

## 2023-11-23 ENCOUNTER — Other Ambulatory Visit: Payer: Self-pay

## 2023-11-23 ENCOUNTER — Ambulatory Visit (HOSPITAL_COMMUNITY): Admitting: Anesthesiology

## 2023-11-23 ENCOUNTER — Encounter (HOSPITAL_COMMUNITY): Payer: Self-pay | Admitting: Optometry

## 2023-11-23 DIAGNOSIS — I10 Essential (primary) hypertension: Secondary | ICD-10-CM | POA: Diagnosis not present

## 2023-11-23 DIAGNOSIS — H2511 Age-related nuclear cataract, right eye: Secondary | ICD-10-CM | POA: Diagnosis not present

## 2023-11-23 DIAGNOSIS — E039 Hypothyroidism, unspecified: Secondary | ICD-10-CM | POA: Insufficient documentation

## 2023-11-23 DIAGNOSIS — H5711 Ocular pain, right eye: Secondary | ICD-10-CM | POA: Diagnosis not present

## 2023-11-23 HISTORY — PX: CATARACT EXTRACTION W/PHACO: SHX586

## 2023-11-23 HISTORY — PX: INSERTION, STENT, DRUG-ELUTING, LACRIMAL CANALICULUS: SHX7453

## 2023-11-23 SURGERY — PHACOEMULSIFICATION, CATARACT, WITH IOL INSERTION
Anesthesia: Monitor Anesthesia Care | Site: Eye | Laterality: Right

## 2023-11-23 MED ORDER — SIGHTPATH DOSE#1 NA HYALUR & NA CHOND-NA HYALUR IO KIT
PACK | INTRAOCULAR | Status: DC | PRN
  Administered 2023-11-23: 1 via OPHTHALMIC

## 2023-11-23 MED ORDER — PHENYLEPHRINE-KETOROLAC 1-0.3 % IO SOLN
INTRAOCULAR | Status: DC | PRN
  Administered 2023-11-23: 500 mL via OPHTHALMIC

## 2023-11-23 MED ORDER — STERILE WATER FOR IRRIGATION IR SOLN
Status: DC | PRN
  Administered 2023-11-23: 1

## 2023-11-23 MED ORDER — LIDOCAINE HCL (PF) 1 % IJ SOLN
INTRAMUSCULAR | Status: DC | PRN
  Administered 2023-11-23: 1 mL

## 2023-11-23 MED ORDER — TROPICAMIDE 1 % OP SOLN
1.0000 [drp] | OPHTHALMIC | Status: AC
  Administered 2023-11-23 (×3): 1 [drp] via OPHTHALMIC

## 2023-11-23 MED ORDER — DEXAMETHASONE 0.4 MG OP INST
VAGINAL_INSERT | OPHTHALMIC | Status: AC
  Filled 2023-11-23: qty 1

## 2023-11-23 MED ORDER — MOXIFLOXACIN HCL 5 MG/ML IO SOLN
INTRAOCULAR | Status: DC | PRN
Start: 2023-11-23 — End: 2023-11-23
  Administered 2023-11-23: .2 mL via INTRACAMERAL

## 2023-11-23 MED ORDER — TETRACAINE HCL 0.5 % OP SOLN
1.0000 [drp] | OPHTHALMIC | Status: AC
  Administered 2023-11-23 (×3): 1 [drp] via OPHTHALMIC

## 2023-11-23 MED ORDER — POVIDONE-IODINE 5 % OP SOLN
OPHTHALMIC | Status: DC | PRN
  Administered 2023-11-23: 1 via OPHTHALMIC

## 2023-11-23 MED ORDER — BSS IO SOLN
INTRAOCULAR | Status: DC | PRN
  Administered 2023-11-23: 15 mL via INTRAOCULAR

## 2023-11-23 MED ORDER — LIDOCAINE HCL 3.5 % OP GEL
1.0000 | Freq: Once | OPHTHALMIC | Status: AC
Start: 2023-11-23 — End: 2023-11-23
  Administered 2023-11-23: 1 via OPHTHALMIC

## 2023-11-23 MED ORDER — DEXAMETHASONE 0.4 MG OP INST
VAGINAL_INSERT | OPHTHALMIC | Status: DC | PRN
  Administered 2023-11-23: .4 mg via OPHTHALMIC

## 2023-11-23 MED ORDER — PHENYLEPHRINE HCL 2.5 % OP SOLN
1.0000 [drp] | OPHTHALMIC | Status: AC
  Administered 2023-11-23 (×3): 1 [drp] via OPHTHALMIC

## 2023-11-23 MED ORDER — MIDAZOLAM HCL (PF) 2 MG/2ML IJ SOLN
INTRAMUSCULAR | Status: DC | PRN
  Administered 2023-11-23: 2 mg via INTRAVENOUS

## 2023-11-23 MED ORDER — MIDAZOLAM HCL 2 MG/2ML IJ SOLN
INTRAMUSCULAR | Status: AC
  Filled 2023-11-23: qty 2

## 2023-11-23 MED ORDER — SODIUM CHLORIDE 0.9% FLUSH
INTRAVENOUS | Status: DC | PRN
  Administered 2023-11-23: 10 mL via INTRAVENOUS

## 2023-11-23 SURGICAL SUPPLY — 12 items
CLOTH BEACON ORANGE TIMEOUT ST (SAFETY) ×2 IMPLANT
DRSG TEGADERM 4X4.75 (GAUZE/BANDAGES/DRESSINGS) ×2 IMPLANT
EYE SHIELD UNIVERSAL CLEAR (GAUZE/BANDAGES/DRESSINGS) IMPLANT
FEE CATARACT SUITE SIGHTPATH (MISCELLANEOUS) ×2 IMPLANT
GLOVE BIOGEL PI IND STRL 7.0 (GLOVE) ×4 IMPLANT
LENS IOL TECNIS EYHANCE 21.0 (Intraocular Lens) IMPLANT
NDL HYPO 18GX1.5 BLUNT FILL (NEEDLE) ×2 IMPLANT
NEEDLE HYPO 18GX1.5 BLUNT FILL (NEEDLE) ×1 IMPLANT
PAD ARMBOARD POSITIONER FOAM (MISCELLANEOUS) ×2 IMPLANT
SYR TB 1ML LL NO SAFETY (SYRINGE) ×2 IMPLANT
TAPE SURG TRANSPORE 1 IN (GAUZE/BANDAGES/DRESSINGS) IMPLANT
WATER STERILE IRR 250ML POUR (IV SOLUTION) ×2 IMPLANT

## 2023-11-23 NOTE — Anesthesia Postprocedure Evaluation (Signed)
 Anesthesia Post Note  Patient: [name]  Procedure(s) Performed: PHACOEMULSIFICATION, CATARACT, WITH IOL INSERTION (Right: Eye) INSERTION, STENT, DRUG-ELUTING, LACRIMAL CANALICULUS (Right: Eye)  Patient location during evaluation: Phase II Anesthesia Type: MAC Level of consciousness: awake Pain management: pain level controlled Vital Signs Assessment: post-procedure vital signs reviewed and stable Respiratory status: spontaneous breathing and respiratory function stable Cardiovascular status: blood pressure returned to baseline and stable Postop Assessment: no headache and no apparent nausea or vomiting Anesthetic complications: no Comments: Late entry   No notable events documented.   Last Vitals:  Vitals:   11/23/23 0758 11/23/23 0902  BP: (!) 144/79 138/80  Pulse: 67 61  Resp: 18 18  Temp: 36.7 C 37.1 C  SpO2: 99% 99%    Last Pain:  Vitals:   11/23/23 0902  TempSrc: Oral  PainSc: 0-No pain                 Yvonna JINNY Bosworth

## 2023-11-23 NOTE — Transfer of Care (Signed)
 Immediate Anesthesia Transfer of Care Note  Patient: [name]  Procedure(s) Performed: PHACOEMULSIFICATION, CATARACT, WITH IOL INSERTION (Right: Eye) INSERTION, STENT, DRUG-ELUTING, LACRIMAL CANALICULUS (Right: Eye)  Patient Location: Short Stay  Anesthesia Type:MAC  Level of Consciousness: awake, alert , oriented, and patient cooperative  Airway & Oxygen Therapy: Patient Spontanous Breathing  Post-op Assessment: Report given to RN, Post -op Vital signs reviewed and stable, and Patient moving all extremities X 4  Post vital signs: Reviewed and stable  Last Vitals:  Vitals Value Taken Time  BP 138/80 11/23/23 09:02  Temp 37.1 C 11/23/23 09:02  Pulse 61 11/23/23 09:02  Resp 18 11/23/23 09:02  SpO2 99 % 11/23/23 09:02    Last Pain:  Vitals:   11/23/23 0902  TempSrc: Oral  PainSc: 0-No pain      Patients Stated Pain Goal: 5 (11/23/23 0902)  Complications: No notable events documented.

## 2023-11-23 NOTE — Discharge Instructions (Signed)
 Please discharge patient when stable, will follow up today with Dr. Ilsa Iha at the [facility] office immediately following discharge.  Leave shield in place until visit.  All paperwork with discharge instructions will be given at the office.  [facility] Address:  [address]  [place], [place] [address]  Dr. Chaya Jan Phone: [phone]

## 2023-11-23 NOTE — Anesthesia Preprocedure Evaluation (Signed)
 Anesthesia Evaluation  Patient identified by MRN, date of birth, ID band Patient awake    Reviewed: Allergy & Precautions, H&P , NPO status , Patient's Chart, lab work & pertinent test results, reviewed documented beta blocker date and time   Airway Mallampati: II  TM Distance: >3 FB Neck ROM: full    Dental no notable dental hx.    Pulmonary neg pulmonary ROS   Pulmonary exam normal breath sounds clear to auscultation       Cardiovascular Exercise Tolerance: Good hypertension,  Rhythm:regular Rate:Normal     Neuro/Psych  Headaches  Neuromuscular disease  negative psych ROS   GI/Hepatic Neg liver ROS,GERD  ,,  Endo/Other  Hypothyroidism    Renal/GU negative Renal ROS  negative genitourinary   Musculoskeletal   Abdominal   Peds  Hematology  (+) Blood dyscrasia, anemia   Anesthesia Other Findings   Reproductive/Obstetrics negative OB ROS                              Anesthesia Physical Anesthesia Plan  ASA: 3  Anesthesia Plan: MAC   Post-op Pain Management:    Induction:   PONV Risk Score and Plan:   Airway Management Planned:   Additional Equipment:   Intra-op Plan:   Post-operative Plan:   Informed Consent: I have reviewed the patients History and Physical, chart, labs and discussed the procedure including the risks, benefits and alternatives for the proposed anesthesia with the patient or authorized representative who has indicated his/her understanding and acceptance.     Dental Advisory Given  Plan Discussed with: CRNA  Anesthesia Plan Comments:         Anesthesia Quick Evaluation

## 2023-11-23 NOTE — Op Note (Addendum)
 Date of procedure: 11/23/23  Pre-operative diagnosis: Visually significant age-related nuclear cataract, Right Eye (H25.11)  Post-operative diagnosis: Visually significant age-related nuclear cataract, Right Eye H25.11; Ocular Pain and Inflammation, Right eye H57.11  Procedure: Removal of cataract via phacoemulsification and insertion of intra-ocular lens J&J DIBOO +21.0D into the capsular bag of the Right Eye, Dextenza  Implantation into right lower punctum CPT [phone]  Attending surgeon: Marsa Cleverly, MD  Anesthesia: MAC, Topical Akten  Complications: None  Estimated Blood Loss: <43mL (minimal)  Specimens: None  Implants:  Implant Name Type Inv. Item Serial No. Manufacturer Lot No. LRB No. Used Action  LENS IOL TECNIS EYHANCE 21.0 - [id number] Intraocular Lens LENS IOL TECNIS EYHANCE 21.0 [id number] [employer]  Right 1 Implanted    Indications:  Visually significant age-related cataract, Right Eye  Procedure:  The patient was seen and identified in the pre-operative area. The operative eye was identified and dilated.  The operative eye was marked.  Topical anesthesia was administered to the operative eye.     The patient was then to the operative suite and placed in the supine position.  A timeout was performed confirming the patient, procedure to be performed, and all other relevant information.   The patient's face was prepped and draped in the usual fashion for intra-ocular surgery.  A lid speculum was placed into the operative eye and the surgical microscope moved into place and focused.  A superotemporal paracentesis was created using a 20 gauge paracentesis blade.  BSS mixed with Omidria, followed by 1% lidocaine  was injected into the anterior chamber.  Viscoelastic was injected into the anterior chamber.  A temporal clear-corneal main wound incision was created using a 2.40mm microkeratome.  A continuous curvilinear capsulorrhexis was initiated using an irrigating cystitome and  completed using capsulorrhexis forceps.  Hydrodissection and hydrodeliniation were performed.  Viscoelastic was injected into the anterior chamber.  A phacoemulsification handpiece and a chopper as a second instrument were used to remove the nucleus and epinucleus. The irrigation/aspiration handpiece was used to remove any remaining cortical material.   The capsular bag was reinflated with viscoelastic, checked, and found to be intact.  The intraocular lens was inserted into the capsular bag.  The irrigation/aspiration handpiece was used to remove any remaining viscoelastic.  The clear corneal wound and paracentesis wounds were then hydrated and checked with Weck-Cels to be watertight. Moxifloxacin was instilled into the anterior chamber.  The lid-speculum and drape were removed. The lower punctum was dilated, and the dextenza  implant was inserted into it. The patient's face was cleaned with a wet and dry 4x4. A clear shield was taped over the eye. The patient was taken to the post-operative care unit in good condition, having tolerated the procedure well.  Post-Op Instructions: The patient will follow up at [facility] for a same day post-operative evaluation and will receive all other orders and instructions.

## 2023-12-04 DIAGNOSIS — H2512 Age-related nuclear cataract, left eye: Secondary | ICD-10-CM | POA: Diagnosis not present

## 2023-12-10 ENCOUNTER — Ambulatory Visit: Admitting: Urology

## 2023-12-10 VITALS — BP 130/64 | HR 73

## 2023-12-10 DIAGNOSIS — R3129 Other microscopic hematuria: Secondary | ICD-10-CM

## 2023-12-10 LAB — URINALYSIS, ROUTINE W REFLEX MICROSCOPIC
Bilirubin, UA: NEGATIVE
Glucose, UA: NEGATIVE
Ketones, UA: NEGATIVE
Leukocytes,UA: NEGATIVE
Nitrite, UA: NEGATIVE
Protein,UA: NEGATIVE
Specific Gravity, UA: 1.01 (ref 1.005–1.030)
Urobilinogen, Ur: 0.2 mg/dL (ref 0.2–1.0)
pH, UA: 6 (ref 5.0–7.5)

## 2023-12-10 LAB — MICROSCOPIC EXAMINATION
Bacteria, UA: NONE SEEN
WBC, UA: NONE SEEN /HPF (ref 0–5)

## 2023-12-10 NOTE — Progress Notes (Unsigned)
 12/10/2023 12:24 PM   [name] Dawn 10-Mar-1950 [id number]  Referring provider: [name], MD [address] [place],  [place] [address]  Followup microhematuria   HPI: Ms [name] is a 73yo here for followup for microhematuria. No gross hematuria since last visit. UA today shows no RBCs. He has Nocturia 1x depending on fluid consumption. No dysuria.    PMH: Past Medical History:  Diagnosis Date   Arthritis    Breast cancer (HCC) 02/19/13   Chronic back pain    reason unknown   Constipation    doesn't take any medss   Diverticulosis    Headache(784.0)    occasionally   History of bladder infections    History of blood transfusion    no abnormal reaction noted   History of colon polyps    Hx of radiation therapy 05/13/13- 06/16/13   right breast 5000 cGy in 25 sessions   Hypercholesteremia    takes Crestor daily   Hypertension    takes HCTZ daily   Hypothyroid    takes Synthroid  daily   Insomnia    takes Ambien nightly as needed   Neck pain 05/13/2012   HNP   Neuropathy    Vertigo    doesn't take any meds    Surgical History: Past Surgical History:  Procedure Laterality Date   ABDOMINAL HYSTERECTOMY  1983   partial   ANTERIOR CERVICAL DECOMP/DISCECTOMY FUSION N/A 03/26/2014   Procedure: ANTERIOR CERVICAL DECOMPRESSION/DISCECTOMY FUSION 1 LEVEL;  Surgeon: Alm Long Molt, MD;  Location: [facility];  Service: Neurosurgery;  Laterality: N/A;  ANTERIOR CERVICAL DECOMPRESSION/DISCECTOMY FUSION 1 LEVEL CERVICAL 5-6   BREAST LUMPECTOMY Right 04/03/2013   BREAST LUMPECTOMY WITH NEEDLE LOCALIZATION AND AXILLARY SENTINEL LYMPH NODE BX Right 04/03/2013   Procedure: BREAST LUMPECTOMY WITH NEEDLE LOCALIZATION AND AXILLARY SENTINEL LYMPH NODE BX;  Surgeon: Deward Long Curvin DOUGLAS, MD;  Location: [facility];  Service: General;  Laterality: Right;   CATARACT EXTRACTION W/PHACO Right 11/23/2023   Procedure: PHACOEMULSIFICATION, CATARACT, WITH IOL INSERTION;  Surgeon: [name],  MD;  Location: [facility];  Service: Ophthalmology;  Laterality: Right;  CDE: 7.52   CHOLECYSTECTOMY  1994   COLONOSCOPY  09/17/2008   MFM:Onwh tortuous, but otherwise normal-appearing colon/. Scattered diverticula   COLONOSCOPY N/A 05/31/2018   Procedure: COLONOSCOPY;  Surgeon: [name], MD;  Location: [facility];  Service: Endoscopy;  Laterality: N/A;  10:45am   ESOPHAGOGASTRODUODENOSCOPY  09/17/2008   RMR:Two mid esophageal diverticula/Small benign cystic mucosal lesions distal esophagus of doubtful  clinical significance, stable for least 5 years/ Small hiatal hernia, otherwise normal stomach D1, D2   ESOPHAGOGASTRODUODENOSCOPY  02/10/2003   WLM:Ipdujo esophageal 3 cystic lesions without luminal compromise or evidence/Nonerosive antral gastritis/The esophagus was dilated by passing 56 French Maloney dilator.SABRA Epic notes states +H.pylori gastritis. treatment completed per epic notes.    ESOPHAGOGASTRODUODENOSCOPY N/A 05/31/2018   Procedure: ESOPHAGOGASTRODUODENOSCOPY (EGD);  Surgeon: [name], MD;  Location: [facility];  Service: Endoscopy;  Laterality: N/A;   ESOPHAGOGASTRODUODENOSCOPY (EGD) WITH ESOPHAGEAL DILATION N/A 02/03/2013   MFM:Dujaoz esophageal duplication cyst. Small esophageal diverticulum. Otherwise;  EGD normal - Widely patent tubular esophagus before and after dilation.  Status post passage of  a  Maloney dilator.   EUS  08/2009   Dr. [name]: EGD with multiple lower esophageal submucosal nodules, stomach and duodenum normal, EUS with esophageal duplication cysts   INSERTION, STENT, DRUG-ELUTING, LACRIMAL CANALICULUS Right 11/23/2023   Procedure: INSERTION, STENT, DRUG-ELUTING, LACRIMAL CANALICULUS;  Surgeon: [name], MD;  Location: [facility];  Service: Ophthalmology;  Laterality: Right;   MALONEY DILATION N/A 05/31/2018   Procedure: AGAPITO DILATION;  Surgeon: [name], MD;  Location: [facility];  Service: Endoscopy;  Laterality: N/A;    POLYPECTOMY  05/31/2018   Procedure: POLYPECTOMY;  Surgeon: [name], MD;  Location: [facility];  Service: Endoscopy;;  colon   THYROID  SURGERY  2011    Home Medications:  Allergies as of 12/10/2023       Reactions   Oxycodone  Hcl    Other reaction(s): Unknown   Tramadol  Nausea Only   Codeine Nausea And Vomiting   Percocet [oxycodone -acetaminophen ] Rash        Medication List        Accurate as of December 10, 2023 12:24 PM. If you have any questions, ask your nurse or doctor.          amLODipine 2.5 MG tablet Commonly known as: NORVASC Take 2.5 mg by mouth daily.   gabapentin  100 MG capsule Commonly known as: NEURONTIN  Take by mouth.   hydrochlorothiazide  25 MG tablet Commonly known as: HYDRODIURIL  Take 25 mg by mouth daily.   levothyroxine  75 MCG tablet Commonly known as: SYNTHROID  Take 1 tablet (75 mcg total) by mouth daily before breakfast. Skip sundays   lidocaine  5 % Commonly known as: LIDODERM    pantoprazole  40 MG tablet Commonly known as: Protonix  Take 1 tablet (40 mg total) by mouth daily.   potassium chloride  SA 20 MEQ tablet Commonly known as: KLOR-CON  M Take 20 mEq by mouth daily.   Restasis 0.05 % ophthalmic emulsion Generic drug: cycloSPORINE 1 drop 2 (two) times daily.   rosuvastatin 5 MG tablet Commonly known as: CRESTOR Take 1 tablet by mouth daily.        Allergies:  Allergies  Allergen Reactions   Oxycodone  Hcl     Other reaction(s): Unknown   Tramadol  Nausea Only   Codeine Nausea And Vomiting   Percocet [Oxycodone -Acetaminophen ] Rash    Family History: Family History  Problem Relation Age of Onset   Lupus Sister    Hypertension Brother    Diabetes Brother    Hypertension Brother    Diabetes Brother    Multiple sclerosis Mother    Cancer Father        stomach   Breast cancer Daughter 75   Colon cancer Neg Hx     Social History:  reports that she has never smoked. She has never used smokeless  tobacco. She reports that she does not drink alcohol and does not use drugs.  ROS: All other review of systems were reviewed and are negative except what is noted above in HPI  Physical Exam: BP 130/64   Pulse 73   Constitutional:  Alert and oriented, No acute distress. HEENT: [place] AT, moist mucus membranes.  Trachea midline, no masses. Cardiovascular: No clubbing, cyanosis, or edema. Respiratory: Normal respiratory effort, no increased work of breathing. GI: Abdomen is soft, nontender, nondistended, no abdominal masses GU: No CVA tenderness.  Lymph: No cervical or inguinal lymphadenopathy. Skin: No rashes, bruises or suspicious lesions. Neurologic: Grossly intact, no focal deficits, moving all 4 extremities. Psychiatric: Normal mood and affect.  Laboratory Data: Lab Results  Component Value Date   WBC 7.7 10/28/2019   HGB 12.8 10/28/2019   HCT 42.2 10/28/2019   MCV 94.2 10/28/2019   PLT 205 10/28/2019    Lab Results  Component Value Date   CREATININE 0.80 10/28/2019  No results found for: PSA  No results found for: TESTOSTERONE  No results found for: HGBA1C  Urinalysis    Component Value Date/Time   COLORURINE YELLOW 10/28/2019 1253   APPEARANCEUR Clear 06/15/2023 1050   LABSPEC 1.005 10/28/2019 1253   PHURINE 6.0 10/28/2019 1253   GLUCOSEU Negative 06/15/2023 1050   HGBUR SMALL (A) 10/28/2019 1253   BILIRUBINUR Negative 06/15/2023 1050   KETONESUR NEGATIVE 10/28/2019 1253   PROTEINUR Negative 06/15/2023 1050   PROTEINUR NEGATIVE 10/28/2019 1253   UROBILINOGEN 0.2 11/20/2012 1624   NITRITE Negative 06/15/2023 1050   NITRITE NEGATIVE 10/28/2019 1253   LEUKOCYTESUR Negative 06/15/2023 1050   LEUKOCYTESUR NEGATIVE 10/28/2019 1253    Lab Results  Component Value Date   LABMICR See below: 06/15/2023   WBCUA None seen 06/15/2023   LABEPIT 0-10 06/15/2023   BACTERIA None seen 06/15/2023    Pertinent Imaging: *** Results for orders placed during  the hospital encounter of 12/29/06  DG Abd 1 View  Narrative Clinical Data: 73 year old with left upper quadrant abdominal pain. ABDOMEN - 1 VIEW: Comparison: None. Findings: Minimal scattered air and stool in the colon. No dilated loops of small bowel to suggest obstruction. No definite free air is seen. The soft tissue shadows of the abdomen are maintained. Numerous phlebolithic calcifications are noted in the pelvis. Surgical clips in the right upper quadrant are noted. No acute bony findings.  Impression No plain film evidence of acute abdominal process.  Provider: Elsie Beams  No results found for this or any previous visit.  No results found for this or any previous visit.  No results found for this or any previous visit.  Results for orders placed during the hospital encounter of 12/13/18  US  RENAL  Narrative CLINICAL DATA:  Initial evaluation for microscopic hematuria.  EXAM: RENAL / URINARY TRACT ULTRASOUND COMPLETE  COMPARISON:  Prior CT from 05/12/2018.  FINDINGS: Right Kidney:  Renal measurements: 11.2 x 5.2 x 6.7 cm = volume: 203.6 mL. Echogenicity within normal limits. No mass or hydronephrosis visualized. No shadowing echogenic foci to suggest nephrolithiasis.  Left Kidney:  Renal measurements: 10.7 x 4.5 x 5.8 cm = volume: 146.2 mL. Echogenicity within normal limits. No mass or hydronephrosis visualized. No shadowing echogenic foci to suggest nephrolithiasis.  Bladder:  Appears normal for degree of bladder distention.  Other:  None.  IMPRESSION: Normal renal ultrasound. No sonographic evidence for nephrolithiasis or obstructive uropathy.   Electronically Signed By: Morene Hoard M.D. On: 12/13/2018 14:12  No results found for this or any previous visit.  No results found for this or any previous visit.  Results for orders placed during the hospital encounter of 05/12/18  CT Renal Stone Study  Narrative CLINICAL DATA:   Right flank pain.  EXAM: CT ABDOMEN AND PELVIS WITHOUT CONTRAST  TECHNIQUE: Multidetector CT imaging of the abdomen and pelvis was performed following the standard protocol without IV contrast.  COMPARISON:  CT scan of December 29, 2017.  FINDINGS: Lower chest: No acute abnormality.  Hepatobiliary: No focal liver abnormality is seen. Status post cholecystectomy. No biliary dilatation.  Pancreas: Unremarkable. No pancreatic ductal dilatation or surrounding inflammatory changes.  Spleen: Calcified splenic granulomata are noted.  Adrenals/Urinary Tract: Adrenal glands are unremarkable. Kidneys are normal, without renal calculi, focal lesion, or hydronephrosis. Bladder is unremarkable.  Stomach/Bowel: Stomach is within normal limits. Appendix appears normal. No evidence of bowel wall thickening, distention, or inflammatory changes. Diverticulosis of descending colon is noted without inflammation.  Vascular/Lymphatic: No significant  vascular findings are present. No enlarged abdominal or pelvic lymph nodes.  Reproductive: Status post hysterectomy. No adnexal masses.  Other: No abdominal wall hernia or abnormality. No abdominopelvic ascites.  Musculoskeletal: No acute or significant osseous findings.  IMPRESSION: No acute abnormality seen in the abdomen or pelvis.   Electronically Signed By: Lynwood Landy Raddle, M.D. On: 05/12/2018 20:16   Assessment & Plan:    1. Microscopic hematuria (Primary) UA today shows no RBCs. She can followup PRN.  - Urinalysis, Routine w reflex microscopic   No follow-ups on file.  Belvie Clara, MD  [facility] [place]

## 2023-12-10 NOTE — Patient Instructions (Signed)
 Blood in the Pee (Hematuria) in Adults: What to Know  Hematuria is blood in the pee. You may be able to see blood in the pee. In some cases, a health care provider may find blood with a test.  Blood in the pee can be caused by infections of the kidney, bladder, or the urethra. The urethra is the tube that drains pee from the bladder.  Other causes may include: Kidney stones. Infection of the prostate. Cancer. Too much calcium in the pee. Conditions that are passed from parent to child. Too much exercise. Infections can be treated with medicine. A kidney stone will usually leave your body when you pee. If infections or kidney stones didn't cause the blood in the urine, then more tests may be needed. It is very important to tell your provider about any blood in your pee, even if you have no pain or the blood stops with no treatment. Blood in the pee can be a sign of a very serious problem, such as cancer. Follow these instructions at home: Medicines Take your medicines only as told. If you were given antibiotics, take them as told. Do not stop taking them even if you start to feel better. Eating and drinking Drink more fluids as told. Aim to drink 3-4 quarts (2.8-3.8 L) a day. Avoid caffeine, tea, and carbonated drinks. These can bother the bladder. Avoid alcohol if a female because it may irritate the prostate. General instructions If you have been diagnosed with a kidney stone, strain your pee to catch the stone if told by your provider. Empty your bladder often. Avoid holding pee for a long time. If you're female, make sure that: You wipe from front to back after using the bathroom. You use each piece of toilet paper only once. You pee before and after sex. It's up to you to get the results of any tests. Ask when your results will be ready and how to get them. You may need to call or meet with your provider to get your results. Keep all follow-up visits. Your provider will need to know  about any changes or any new symptoms. Contact a health care provider if: Your symptoms don't get better after 3 days. Your symptoms get worse. You have back pain or belly pain. You have a fever or chills. You throw up or feel like you may throw up. You throw up every time you take medicine. Get help right away if: You pass blood clots in your pee. You pass out. These symptoms may be an emergency. Call 911 right away. Do not wait to see if the symptoms will go away. Do not drive yourself to the hospital. This information is not intended to replace advice given to you by your health care provider. Make sure you discuss any questions you have with your health care provider. Document Revised: 11/02/2022 Document Reviewed: 10/12/2022 Elsevier Patient Education  2024 [employer].

## 2023-12-11 ENCOUNTER — Encounter: Payer: Self-pay | Admitting: Urology

## 2023-12-13 ENCOUNTER — Encounter (HOSPITAL_COMMUNITY)
Admission: RE | Admit: 2023-12-13 | Discharge: 2023-12-13 | Disposition: A | Discharge status: Home / Self Care | Source: Ambulatory Visit | Attending: Optometry | Admitting: Optometry

## 2023-12-13 ENCOUNTER — Encounter (HOSPITAL_COMMUNITY): Payer: Self-pay

## 2023-12-14 NOTE — H&P (Signed)
 Surgical History & Physical  Patient Name: [name]  DOB: 12/10/1950  Surgery: Cataract extraction with intraocular lens implant phacoemulsification; Left Eye Surgeon: Marsa Cleverly MD Surgery Date: 12/18/2023 Pre-Op Date: 11/28/2023  HPI: A 17 Yr. old female patient 1. The patient is returning after cataract surgery. The right eye is affected. Status post cataract surgery, which began 5 days ago: Since the last visit, the affected area feels improvement. The patient's vision is improved. The condition's severity is constant. Patient is following medication instructions. 2. The patient is returning for a cataract follow-up of the left eye. Since the last visit, the affected area is tolerating. The patient's vision is blurry. The condition's severity is constant. Patient is not taking medications. HPI was performed by Marsa Cleverly .  Medical History: Dry Eyes Cataracts  Arthritis High Blood Pressure LDL Thyroid  Problems  Review of Systems Cardiovascular High Blood Pressure Endocrine high cholesterol All recorded systems are negative except as noted above.  Social Never smoked  Medication Prednisolone-moxiflox-bromfen,  Amlodipine, Ketoconazole, Synthroid , Ibuprofen , Trazodone, Hydrochlorothiazide , Rosuvastatin  Sx/Procedures Phaco c IOL OD-dextenza   Drug Allergies  Percocet, Codeine sulfate  History & Physical: Heent: cataract NECK: supple without bruits LUNGS: lungs clear to auscultation CV: regular rate and rhythm Abdomen: soft and non-tender  Impression & Plan: Assessment: 1.  CATARACT NUCLEAR SCLEROSIS AGE RELATED; Both Eyes (H25.13) 2.  CATARACT EXTRACTION STATUS; Right Eye (Z98.41) 3.  INTRAOCULAR LENS IOL (Z96.1)  Plan: 1.  Cataracts are visually significant and account for the patient's complaints. Discussed all risks, benefits, procedures and recovery, including infection, loss of vision and eye, need for glasses after surgery or additional  procedures. Patient understands changing glasses will not improve vision. Patient indicated understanding of procedure. All questions answered. Patient desires to have surgery, recommend phacoemulsification with intraocular lens. Patient to have preliminary testing necessary ([employer]/IOL Master, Mac OCT, TOPO) Educational materials provided: Cataract.  Plan: - Proceed with cataract surgery OS when ready - Plan for best distance target with DIB00 - No DM, no fuchs, no prior eye surgery - good dilation - Dextenza  if available  2.  CE/PCIOL OD 11/23/23 POD5 exam Doing well. All post-op precautions discussed and instructions reviewed. Written instructions given.  3.  See above

## 2023-12-18 ENCOUNTER — Ambulatory Visit (HOSPITAL_COMMUNITY): Admitting: Certified Registered Nurse Anesthetist

## 2023-12-18 ENCOUNTER — Encounter (HOSPITAL_COMMUNITY)
Admission: RE | Disposition: A | Discharge status: Home / Self Care | Payer: Self-pay | Source: Home / Self Care | Attending: Optometry

## 2023-12-18 ENCOUNTER — Encounter (HOSPITAL_COMMUNITY): Payer: Self-pay | Admitting: Optometry

## 2023-12-18 ENCOUNTER — Other Ambulatory Visit: Payer: Self-pay

## 2023-12-18 ENCOUNTER — Ambulatory Visit (HOSPITAL_COMMUNITY)
Admission: RE | Admit: 2023-12-18 | Discharge: 2023-12-18 | Disposition: A | Discharge status: Home / Self Care | Attending: Optometry | Admitting: Optometry

## 2023-12-18 DIAGNOSIS — H5712 Ocular pain, left eye: Secondary | ICD-10-CM | POA: Insufficient documentation

## 2023-12-18 DIAGNOSIS — H2512 Age-related nuclear cataract, left eye: Secondary | ICD-10-CM | POA: Diagnosis not present

## 2023-12-18 DIAGNOSIS — E039 Hypothyroidism, unspecified: Secondary | ICD-10-CM | POA: Diagnosis not present

## 2023-12-18 DIAGNOSIS — I10 Essential (primary) hypertension: Secondary | ICD-10-CM

## 2023-12-18 HISTORY — PX: INSERTION, STENT, DRUG-ELUTING, LACRIMAL CANALICULUS: SHX7453

## 2023-12-18 HISTORY — PX: CATARACT EXTRACTION W/PHACO: SHX586

## 2023-12-18 SURGERY — PHACOEMULSIFICATION, CATARACT, WITH IOL INSERTION
Anesthesia: Monitor Anesthesia Care | Site: Eye | Laterality: Left

## 2023-12-18 MED ORDER — MIDAZOLAM HCL (PF) 2 MG/2ML IJ SOLN
INTRAMUSCULAR | Status: DC | PRN
  Administered 2023-12-18: 2 mg via INTRAVENOUS

## 2023-12-18 MED ORDER — SIGHTPATH DOSE#1 NA HYALUR & NA CHOND-NA HYALUR IO KIT
PACK | INTRAOCULAR | Status: DC | PRN
Start: 2023-12-18 — End: 2023-12-18
  Administered 2023-12-18: 1 via OPHTHALMIC

## 2023-12-18 MED ORDER — TROPICAMIDE 1 % OP SOLN
1.0000 [drp] | OPHTHALMIC | Status: AC | PRN
  Administered 2023-12-18 (×3): 1 [drp] via OPHTHALMIC

## 2023-12-18 MED ORDER — SODIUM CHLORIDE 0.9% FLUSH
INTRAVENOUS | Status: DC | PRN
  Administered 2023-12-18: 5 mL via INTRAVENOUS

## 2023-12-18 MED ORDER — LACTATED RINGERS IV SOLN: INTRAVENOUS | Status: DC

## 2023-12-18 MED ORDER — LIDOCAINE HCL (PF) 1 % IJ SOLN
INTRAMUSCULAR | Status: DC | PRN
Start: 2023-12-18 — End: 2023-12-18
  Administered 2023-12-18: 1 mL

## 2023-12-18 MED ORDER — MOXIFLOXACIN HCL 5 MG/ML IO SOLN
INTRAOCULAR | Status: DC | PRN
  Administered 2023-12-18: .2 mL via INTRACAMERAL

## 2023-12-18 MED ORDER — PHENYLEPHRINE-KETOROLAC 1-0.3 % IO SOLN
INTRAOCULAR | Status: DC | PRN
  Administered 2023-12-18: 500 mL via OPHTHALMIC

## 2023-12-18 MED ORDER — PHENYLEPHRINE HCL 2.5 % OP SOLN
1.0000 [drp] | OPHTHALMIC | Status: AC | PRN
  Administered 2023-12-18 (×3): 1 [drp] via OPHTHALMIC

## 2023-12-18 MED ORDER — TETRACAINE HCL 0.5 % OP SOLN
1.0000 [drp] | OPHTHALMIC | Status: AC | PRN
  Administered 2023-12-18 (×3): 1 [drp] via OPHTHALMIC

## 2023-12-18 MED ORDER — DEXAMETHASONE 0.4 MG OP INST
VAGINAL_INSERT | OPHTHALMIC | Status: AC
  Filled 2023-12-18: qty 1

## 2023-12-18 MED ORDER — LIDOCAINE HCL 3.5 % OP GEL
1.0000 | Freq: Once | OPHTHALMIC | Status: AC
  Administered 2023-12-18: 1 via OPHTHALMIC

## 2023-12-18 MED ORDER — STERILE WATER FOR IRRIGATION IR SOLN
Status: DC | PRN
Start: 2023-12-18 — End: 2023-12-18
  Administered 2023-12-18: 1

## 2023-12-18 MED ORDER — MIDAZOLAM HCL 2 MG/2ML IJ SOLN
INTRAMUSCULAR | Status: AC
  Filled 2023-12-18: qty 2

## 2023-12-18 MED ORDER — BSS IO SOLN
INTRAOCULAR | Status: DC | PRN
Start: 2023-12-18 — End: 2023-12-18
  Administered 2023-12-18: 15 mL via INTRAOCULAR

## 2023-12-18 MED ORDER — POVIDONE-IODINE 5 % OP SOLN
OPHTHALMIC | Status: DC | PRN
  Administered 2023-12-18: 1 via OPHTHALMIC

## 2023-12-18 MED ORDER — DEXAMETHASONE 0.4 MG OP INST
VAGINAL_INSERT | OPHTHALMIC | Status: DC | PRN
  Administered 2023-12-18: .4 mg via OPHTHALMIC

## 2023-12-18 SURGICAL SUPPLY — 12 items
CLOTH BEACON ORANGE TIMEOUT ST (SAFETY) ×2 IMPLANT
DRSG TEGADERM 4X4.75 (GAUZE/BANDAGES/DRESSINGS) ×2 IMPLANT
EYE SHIELD UNIVERSAL CLEAR (GAUZE/BANDAGES/DRESSINGS) IMPLANT
FEE CATARACT SUITE SIGHTPATH (MISCELLANEOUS) ×2 IMPLANT
GLOVE BIOGEL PI IND STRL 7.0 (GLOVE) ×4 IMPLANT
LENS IOL TECNIS EYHANCE 21.5 (Intraocular Lens) IMPLANT
NDL HYPO 18GX1.5 BLUNT FILL (NEEDLE) ×2 IMPLANT
NEEDLE HYPO 18GX1.5 BLUNT FILL (NEEDLE) ×1 IMPLANT
PAD ARMBOARD POSITIONER FOAM (MISCELLANEOUS) ×2 IMPLANT
SYR TB 1ML LL NO SAFETY (SYRINGE) ×2 IMPLANT
TAPE SURG TRANSPORE 1 IN (GAUZE/BANDAGES/DRESSINGS) IMPLANT
WATER STERILE IRR 250ML POUR (IV SOLUTION) ×2 IMPLANT

## 2023-12-18 NOTE — Transfer of Care (Signed)
 Immediate Anesthesia Transfer of Care Note  Patient: [name]  Procedure(s) Performed: PHACOEMULSIFICATION, CATARACT, WITH IOL INSERTION (Left: Eye) INSERTION, STENT, DRUG-ELUTING, LACRIMAL CANALICULUS (Left: Eye)  Patient Location: Short Stay  Anesthesia Type:MAC  Level of Consciousness: awake, alert , and oriented  Airway & Oxygen Therapy: Patient Spontanous Breathing  Post-op Assessment: Report given to RN and Post -op Vital signs reviewed and stable  Post vital signs: Reviewed and stable  Last Vitals:  Vitals Value Taken Time  BP 134/68   Temp    Pulse 68   Resp    SpO2 99%     Last Pain:  Vitals:   12/18/23 0904  TempSrc: Oral  PainSc: 0-No pain         Complications: No notable events documented.

## 2023-12-18 NOTE — Anesthesia Preprocedure Evaluation (Signed)
 Anesthesia Evaluation  Patient identified by MRN, date of birth, ID band Patient awake    Reviewed: Allergy & Precautions, H&P , NPO status , Patient's Chart, lab work & pertinent test results, reviewed documented beta blocker date and time   Airway Mallampati: II  TM Distance: >3 FB Neck ROM: full    Dental no notable dental hx. (+) Dental Advisory Given, Teeth Intact   Pulmonary neg pulmonary ROS   Pulmonary exam normal breath sounds clear to auscultation       Cardiovascular Exercise Tolerance: Good hypertension, Normal cardiovascular exam Rhythm:regular Rate:Normal     Neuro/Psych  Headaches  Neuromuscular disease  negative psych ROS   GI/Hepatic Neg liver ROS,GERD  ,,  Endo/Other  Hypothyroidism    Renal/GU negative Renal ROS  negative genitourinary   Musculoskeletal   Abdominal   Peds  Hematology  (+) Blood dyscrasia, anemia   Anesthesia Other Findings   Reproductive/Obstetrics negative OB ROS                              Anesthesia Physical Anesthesia Plan  ASA: 3  Anesthesia Plan: MAC   Post-op Pain Management: Minimal or no pain anticipated   Induction: Intravenous  PONV Risk Score and Plan: Midazolam   Airway Management Planned: Natural Airway and Nasal Cannula  Additional Equipment: None  Intra-op Plan:   Post-operative Plan:   Informed Consent: I have reviewed the patients History and Physical, chart, labs and discussed the procedure including the risks, benefits and alternatives for the proposed anesthesia with the patient or authorized representative who has indicated his/her understanding and acceptance.     Dental Advisory Given  Plan Discussed with: CRNA  Anesthesia Plan Comments:          Anesthesia Quick Evaluation

## 2023-12-18 NOTE — Anesthesia Postprocedure Evaluation (Signed)
 Anesthesia Post Note  Patient: [name]  Procedure(s) Performed: PHACOEMULSIFICATION, CATARACT, WITH IOL INSERTION (Left: Eye) INSERTION, STENT, DRUG-ELUTING, LACRIMAL CANALICULUS (Left: Eye)  Patient location during evaluation: Endoscopy Anesthesia Type: MAC Level of consciousness: awake and alert Pain management: pain level controlled Vital Signs Assessment: post-procedure vital signs reviewed and stable Respiratory status: spontaneous breathing, nonlabored ventilation and respiratory function stable Cardiovascular status: stable Anesthetic complications: no   There were no known notable events for this encounter.   Last Vitals:  Vitals:   12/18/23 0904 12/18/23 0933  BP: (!) 147/72 134/68  Pulse: 74 65  Resp: 16 16  Temp: 36.9 C 37.2 C  SpO2: 100% 100%    Last Pain:  Vitals:   12/18/23 0933  TempSrc: Oral  PainSc: 0-No pain                 Elan Mcelvain L Kaushik Maul

## 2023-12-18 NOTE — Interval H&P Note (Signed)
 History and Physical Interval Note:  12/18/2023 8:47 AM  [name]  has presented today for surgery, with the diagnosis of nuclear sclerotic cataract, left eye.  The various methods of treatment have been discussed with the patient and family. After consideration of risks, benefits and other options for treatment, the patient has consented to  Procedure(s) with comments: PHACOEMULSIFICATION, CATARACT, WITH IOL INSERTION (Left) - CDE: INSERTION, STENT, DRUG-ELUTING, LACRIMAL CANALICULUS (Left) as a surgical intervention.  The patient's history has been reviewed, patient examined, no change in status, stable for surgery.  I have reviewed the patient's chart and labs.  Questions were answered to the patient's satisfaction.    The H and P was reviewed and updated. The patient was examined.  No changes were found after exam.  The surgical eye was marked.    Tyquasia Pant

## 2023-12-18 NOTE — Op Note (Signed)
 Date of procedure: 12/18/23  Pre-operative diagnosis: Visually significant age-related nuclear cataract, Left Eye (H25.12)  Post-operative diagnosis: Visually significant age-related nuclear cataract, Left Eye H25.12; Ocular Pain and Inflammation, Left eye H57.12  Procedure: Removal of cataract via phacoemulsification and insertion of intra-ocular lens J&J DIB00 +21.5D into the capsular bag of the Left Eye, Dextenza  Implantation into left lower punctum CPT [phone]  Attending surgeon: Marsa JINNY Cleverly, MD  Anesthesia: MAC, Topical Akten  Complications: None  Estimated Blood Loss: <47mL (minimal)  Specimens: None  Implants:  Implant Name Type Inv. Item Serial No. Manufacturer Lot No. LRB No. Used Action  LENS IOL TECNIS EYHANCE 21.5 - [id number] Intraocular Lens LENS IOL TECNIS EYHANCE 21.5 [id number] [employer]  Left 1 Implanted    Indications:  Visually significant age-related cataract, Left Eye  Procedure:  The patient was seen and identified in the pre-operative area. The operative eye was identified and dilated.  The operative eye was marked.  Topical anesthesia was administered to the operative eye.     The patient was then to the operative suite and placed in the supine position.  A timeout was performed confirming the patient, procedure to be performed, and all other relevant information.   The patient's face was prepped and draped in the usual fashion for intra-ocular surgery.  A lid speculum was placed into the operative eye and the surgical microscope moved into place and focused.  An inferotemporal paracentesis was created using a 20 gauge paracentesis blade.  BSS mixed with Omidria, followed by 1% lidocaine  was injected into the anterior chamber.  Viscoelastic was injected into the anterior chamber.  A temporal clear-corneal main wound incision was created using a 2.69mm microkeratome.  A continuous curvilinear capsulorrhexis was initiated using an irrigating cystitome and  completed using capsulorrhexis forceps.  Hydrodissection and hydrodeliniation were performed.  Viscoelastic was injected into the anterior chamber.  A phacoemulsification handpiece and a chopper as a second instrument were used to remove the nucleus and epinucleus. The irrigation/aspiration handpiece was used to remove any remaining cortical material.   The capsular bag was reinflated with viscoelastic, checked, and found to be intact.  The intraocular lens was inserted into the capsular bag.  The irrigation/aspiration handpiece was used to remove any remaining viscoelastic.  The clear corneal wound and paracentesis wounds were then hydrated and checked with Weck-Cels to be watertight. Moxifloxacin was instilled into the anterior chamber.  The lid-speculum and drape were removed. The lower punctum was dilated, and the dextenza  implant was inserted into it. The patient's face was cleaned with a wet and dry 4x4.  A clear shield was taped over the eye. The patient was taken to the post-operative care unit in good condition, having tolerated the procedure well.  Post-Op Instructions: The patient will follow up at [facility] for a same day post-operative evaluation and will receive all other orders and instructions.

## 2023-12-18 NOTE — Discharge Instructions (Signed)
 Please discharge patient when stable, will follow up today with Dr. Ilsa Iha at the [facility] office immediately following discharge.  Leave shield in place until visit.  All paperwork with discharge instructions will be given at the office.  [facility] Address:  [address]  [place], [place] [address]  Dr. Chaya Jan Phone: [phone]

## 2023-12-19 ENCOUNTER — Encounter (HOSPITAL_COMMUNITY): Payer: Self-pay | Admitting: Optometry

## 2024-01-03 DIAGNOSIS — M545 Low back pain, unspecified: Secondary | ICD-10-CM | POA: Diagnosis not present

## 2024-02-14 ENCOUNTER — Other Ambulatory Visit: Payer: Self-pay | Admitting: "Endocrinology

## 2024-02-14 DIAGNOSIS — E890 Postprocedural hypothyroidism: Secondary | ICD-10-CM

## 2024-02-19 ENCOUNTER — Other Ambulatory Visit

## 2024-02-20 LAB — TSH+FREE T4: TSH W/REFLEX TO FT4: 1.76 m[IU]/L (ref 0.40–4.50)

## 2024-02-26 ENCOUNTER — Ambulatory Visit: Admitting: "Endocrinology

## 2024-03-31 ENCOUNTER — Ambulatory Visit: Admitting: "Endocrinology
